# Patient Record
Sex: Male | Born: 1950
Health system: Southern US, Community
[De-identification: ages and names within clinical notes are randomized; demographics above are authoritative.]

## PROBLEM LIST (undated history)

## (undated) DIAGNOSIS — M19032 Primary osteoarthritis, left wrist: Secondary | ICD-10-CM

## (undated) DIAGNOSIS — Z951 Presence of aortocoronary bypass graft: Secondary | ICD-10-CM

## (undated) DIAGNOSIS — I2510 Atherosclerotic heart disease of native coronary artery without angina pectoris: Secondary | ICD-10-CM

## (undated) DIAGNOSIS — E785 Hyperlipidemia, unspecified: Secondary | ICD-10-CM

## (undated) DIAGNOSIS — Z95810 Presence of automatic (implantable) cardiac defibrillator: Secondary | ICD-10-CM

## (undated) DIAGNOSIS — I480 Paroxysmal atrial fibrillation: Secondary | ICD-10-CM

## (undated) DIAGNOSIS — I469 Cardiac arrest, cause unspecified: Secondary | ICD-10-CM

## (undated) DIAGNOSIS — N529 Male erectile dysfunction, unspecified: Secondary | ICD-10-CM

## (undated) DIAGNOSIS — I5022 Chronic systolic (congestive) heart failure: Secondary | ICD-10-CM

## (undated) DIAGNOSIS — N400 Enlarged prostate without lower urinary tract symptoms: Secondary | ICD-10-CM

## (undated) HISTORY — PX: MITRAL VALVE REPAIR: SHX2039

## (undated) HISTORY — PX: CARPAL TUNNEL RELEASE: SHX101

## (undated) HISTORY — DX: Primary osteoarthritis, left wrist: M19.032

## (undated) HISTORY — DX: Benign prostatic hyperplasia without lower urinary tract symptoms: N40.0

## (undated) HISTORY — DX: Presence of aortocoronary bypass graft: Z95.1

## (undated) HISTORY — DX: Cardiac arrest, cause unspecified: I46.9

## (undated) HISTORY — DX: Male erectile dysfunction, unspecified: N52.9

## (undated) HISTORY — PX: CORONARY ARTERY BYPASS GRAFT: SHX141

## (undated) HISTORY — DX: Atherosclerotic heart disease of native coronary artery without angina pectoris: I25.10

---

## 2004-10-12 ENCOUNTER — Emergency Department: Payer: Self-pay | Admitting: Emergency Medicine

## 2004-10-13 ENCOUNTER — Other Ambulatory Visit: Payer: Self-pay

## 2004-10-16 ENCOUNTER — Ambulatory Visit: Payer: Self-pay | Admitting: Family Medicine

## 2004-11-04 ENCOUNTER — Ambulatory Visit: Payer: Self-pay | Admitting: Internal Medicine

## 2005-01-08 ENCOUNTER — Ambulatory Visit: Payer: Self-pay | Admitting: Internal Medicine

## 2005-03-16 ENCOUNTER — Emergency Department: Payer: Self-pay | Admitting: Emergency Medicine

## 2006-05-14 DIAGNOSIS — I2510 Atherosclerotic heart disease of native coronary artery without angina pectoris: Secondary | ICD-10-CM

## 2006-05-14 HISTORY — DX: Atherosclerotic heart disease of native coronary artery without angina pectoris: I25.10

## 2007-02-13 ENCOUNTER — Emergency Department: Payer: Self-pay | Admitting: General Practice

## 2008-01-15 ENCOUNTER — Emergency Department: Payer: Self-pay | Admitting: Emergency Medicine

## 2008-01-15 ENCOUNTER — Other Ambulatory Visit: Payer: Self-pay

## 2008-06-24 ENCOUNTER — Other Ambulatory Visit: Payer: Self-pay

## 2008-06-25 ENCOUNTER — Ambulatory Visit: Payer: Self-pay | Admitting: Cardiology

## 2008-06-25 ENCOUNTER — Inpatient Hospital Stay: Payer: Self-pay | Admitting: Internal Medicine

## 2008-06-27 HISTORY — PX: CARDIAC CATHETERIZATION: SHX172

## 2009-09-21 ENCOUNTER — Ambulatory Visit: Payer: Self-pay | Admitting: Internal Medicine

## 2013-11-04 ENCOUNTER — Emergency Department: Payer: Self-pay | Admitting: Emergency Medicine

## 2013-11-05 LAB — COMPREHENSIVE METABOLIC PANEL
Albumin: 2.8 g/dL — ABNORMAL LOW (ref 3.4–5.0)
Alkaline Phosphatase: 120 U/L — ABNORMAL HIGH
Anion Gap: 13 (ref 7–16)
BUN: 16 mg/dL (ref 7–18)
Bilirubin,Total: 0.4 mg/dL (ref 0.2–1.0)
Calcium, Total: 7.2 mg/dL — ABNORMAL LOW (ref 8.5–10.1)
Chloride: 107 mmol/L (ref 98–107)
Creatinine: 1.74 mg/dL — ABNORMAL HIGH (ref 0.60–1.30)
EGFR (African American): 47 — ABNORMAL LOW
SGOT(AST): 63 U/L — ABNORMAL HIGH (ref 15–37)
SGPT (ALT): 53 U/L (ref 12–78)
Sodium: 140 mmol/L (ref 136–145)

## 2013-11-05 LAB — PROTIME-INR: Prothrombin Time: 15 secs — ABNORMAL HIGH (ref 11.5–14.7)

## 2013-11-05 LAB — APTT: Activated PTT: 31.9 secs (ref 23.6–35.9)

## 2013-11-05 LAB — CK TOTAL AND CKMB (NOT AT ARMC): CK, Total: 535 U/L — ABNORMAL HIGH (ref 35–232)

## 2013-11-12 DIAGNOSIS — I469 Cardiac arrest, cause unspecified: Secondary | ICD-10-CM

## 2013-11-12 HISTORY — DX: Cardiac arrest, cause unspecified: I46.9

## 2013-12-15 DIAGNOSIS — Z95810 Presence of automatic (implantable) cardiac defibrillator: Secondary | ICD-10-CM | POA: Insufficient documentation

## 2013-12-20 ENCOUNTER — Ambulatory Visit: Payer: Self-pay | Admitting: Family Medicine

## 2013-12-26 DIAGNOSIS — I480 Paroxysmal atrial fibrillation: Secondary | ICD-10-CM | POA: Insufficient documentation

## 2014-01-03 ENCOUNTER — Encounter: Payer: Self-pay | Admitting: Family Medicine

## 2014-01-18 ENCOUNTER — Ambulatory Visit: Payer: Self-pay | Admitting: Family Medicine

## 2014-01-29 ENCOUNTER — Encounter: Payer: Self-pay | Admitting: Family Medicine

## 2014-02-07 DIAGNOSIS — Z4502 Encounter for adjustment and management of automatic implantable cardiac defibrillator: Secondary | ICD-10-CM | POA: Insufficient documentation

## 2014-03-01 ENCOUNTER — Encounter: Payer: Self-pay | Admitting: Family Medicine

## 2014-05-12 LAB — CBC
Albumin Serum: 4.1
BUN/Creatinine Ratio: 11
CO2: 25
Calcium: 9.3 mg/dL
Chloride: 102 mmol/L
GFR, EST NON AFRICAN AMERICAN: 47
GFR, Est African American: 54
PHOSPHORUS: 3.8
VLDL Cholesterol Cal: 16

## 2014-05-12 LAB — LIPID PANEL
Cholesterol: 145 mg/dL (ref 0–200)
HDL: 46 mg/dL (ref 35–70)
LDL CALC: 83 mg/dL
LDL/HDL RATIO: 1.8
TRIGLYCERIDES: 81 mg/dL (ref 40–160)

## 2014-05-12 LAB — BASIC METABOLIC PANEL
BUN: 17 mg/dL (ref 4–21)
Creatinine: 1.6 mg/dL — AB (ref 0.6–1.3)
GLUCOSE: 86 mg/dL
POTASSIUM: 4 mmol/L (ref 3.4–5.3)
SODIUM: 143 mmol/L (ref 137–147)

## 2014-05-12 LAB — PSA: PSA: 0.7

## 2014-11-08 LAB — CBC
ALBUMIN SERUM: 4.1
BUN / CREAT RATIO: 9
BUN: 15
CO2: 26
Calcium: 9.4 mg/dL
Chloride: 100 mmol/L
Creat: 1.73
GFR, Est African American: 47
GFR, Est Non African American: 41
Glucose: 96
PHOSPHORUS: 3
POTASSIUM: 3.7 mmol/L
SODIUM: 143

## 2015-02-06 DIAGNOSIS — I2510 Atherosclerotic heart disease of native coronary artery without angina pectoris: Secondary | ICD-10-CM | POA: Diagnosis not present

## 2015-02-06 DIAGNOSIS — E119 Type 2 diabetes mellitus without complications: Secondary | ICD-10-CM | POA: Diagnosis not present

## 2015-02-06 DIAGNOSIS — I509 Heart failure, unspecified: Secondary | ICD-10-CM | POA: Diagnosis not present

## 2015-02-26 DIAGNOSIS — I255 Ischemic cardiomyopathy: Secondary | ICD-10-CM | POA: Diagnosis not present

## 2015-02-26 DIAGNOSIS — I5022 Chronic systolic (congestive) heart failure: Secondary | ICD-10-CM | POA: Diagnosis not present

## 2015-02-26 DIAGNOSIS — I480 Paroxysmal atrial fibrillation: Secondary | ICD-10-CM | POA: Diagnosis not present

## 2015-02-26 DIAGNOSIS — I10 Essential (primary) hypertension: Secondary | ICD-10-CM | POA: Diagnosis not present

## 2015-02-26 DIAGNOSIS — N183 Chronic kidney disease, stage 3 (moderate): Secondary | ICD-10-CM | POA: Diagnosis not present

## 2015-02-26 LAB — CBC
Albumin Serum: 4.5
BUN / CREAT RATIO: 9
BUN: 16
CHLORIDE: 100 mmol/L
Calcium: 9.6 mg/dL
Carbon Dioxide, Total: 26
Creat: 1.73
GFR, EST NON AFRICAN AMERICAN: 41
GFR, Est African American: 47
GLUCOSE: 88
PHOSPHORUS: 2.6
Potassium: 4.4 mmol/L
Sodium: 142

## 2015-02-27 DIAGNOSIS — E784 Other hyperlipidemia: Secondary | ICD-10-CM | POA: Diagnosis not present

## 2015-02-27 DIAGNOSIS — H5210 Myopia, unspecified eye: Secondary | ICD-10-CM | POA: Diagnosis not present

## 2015-02-27 DIAGNOSIS — I10 Essential (primary) hypertension: Secondary | ICD-10-CM | POA: Diagnosis not present

## 2015-03-01 DIAGNOSIS — I4891 Unspecified atrial fibrillation: Secondary | ICD-10-CM | POA: Diagnosis not present

## 2015-03-02 DIAGNOSIS — Z0100 Encounter for examination of eyes and vision without abnormal findings: Secondary | ICD-10-CM | POA: Diagnosis not present

## 2015-03-29 ENCOUNTER — Encounter: Payer: Self-pay | Admitting: *Deleted

## 2015-03-29 DIAGNOSIS — R29898 Other symptoms and signs involving the musculoskeletal system: Secondary | ICD-10-CM | POA: Insufficient documentation

## 2015-03-29 DIAGNOSIS — I429 Cardiomyopathy, unspecified: Secondary | ICD-10-CM | POA: Insufficient documentation

## 2015-03-29 DIAGNOSIS — I43 Cardiomyopathy in diseases classified elsewhere: Secondary | ICD-10-CM | POA: Insufficient documentation

## 2015-03-29 DIAGNOSIS — Z95810 Presence of automatic (implantable) cardiac defibrillator: Secondary | ICD-10-CM

## 2015-03-29 DIAGNOSIS — I10 Essential (primary) hypertension: Secondary | ICD-10-CM | POA: Insufficient documentation

## 2015-03-29 DIAGNOSIS — N183 Chronic kidney disease, stage 3 unspecified: Secondary | ICD-10-CM

## 2015-03-29 DIAGNOSIS — E785 Hyperlipidemia, unspecified: Secondary | ICD-10-CM

## 2015-03-29 DIAGNOSIS — N400 Enlarged prostate without lower urinary tract symptoms: Secondary | ICD-10-CM

## 2015-03-29 DIAGNOSIS — I159 Secondary hypertension, unspecified: Secondary | ICD-10-CM

## 2015-03-29 DIAGNOSIS — M79672 Pain in left foot: Secondary | ICD-10-CM | POA: Insufficient documentation

## 2015-03-29 DIAGNOSIS — N529 Male erectile dysfunction, unspecified: Secondary | ICD-10-CM | POA: Insufficient documentation

## 2015-03-29 DIAGNOSIS — I119 Hypertensive heart disease without heart failure: Secondary | ICD-10-CM | POA: Insufficient documentation

## 2015-05-17 ENCOUNTER — Ambulatory Visit (INDEPENDENT_AMBULATORY_CARE_PROVIDER_SITE_OTHER): Payer: Commercial Managed Care - HMO | Admitting: Family Medicine

## 2015-05-17 ENCOUNTER — Encounter: Payer: Self-pay | Admitting: Family Medicine

## 2015-05-17 VITALS — BP 140/78 | HR 57 | Temp 98.1°F | Resp 16 | Ht 68.5 in | Wt 200.0 lb

## 2015-05-17 DIAGNOSIS — I482 Chronic atrial fibrillation, unspecified: Secondary | ICD-10-CM

## 2015-05-17 DIAGNOSIS — Z1211 Encounter for screening for malignant neoplasm of colon: Secondary | ICD-10-CM

## 2015-05-17 DIAGNOSIS — N183 Chronic kidney disease, stage 3 unspecified: Secondary | ICD-10-CM

## 2015-05-17 DIAGNOSIS — I110 Hypertensive heart disease with heart failure: Secondary | ICD-10-CM

## 2015-05-17 DIAGNOSIS — I159 Secondary hypertension, unspecified: Secondary | ICD-10-CM

## 2015-05-17 DIAGNOSIS — Z0000 Encounter for general adult medical examination without abnormal findings: Secondary | ICD-10-CM | POA: Diagnosis not present

## 2015-05-17 DIAGNOSIS — Z951 Presence of aortocoronary bypass graft: Secondary | ICD-10-CM

## 2015-05-17 DIAGNOSIS — E785 Hyperlipidemia, unspecified: Secondary | ICD-10-CM | POA: Diagnosis not present

## 2015-05-17 DIAGNOSIS — Z125 Encounter for screening for malignant neoplasm of prostate: Secondary | ICD-10-CM

## 2015-05-17 DIAGNOSIS — I429 Cardiomyopathy, unspecified: Secondary | ICD-10-CM | POA: Diagnosis not present

## 2015-05-17 DIAGNOSIS — Z95810 Presence of automatic (implantable) cardiac defibrillator: Secondary | ICD-10-CM | POA: Diagnosis not present

## 2015-05-17 DIAGNOSIS — I5020 Unspecified systolic (congestive) heart failure: Secondary | ICD-10-CM | POA: Insufficient documentation

## 2015-05-17 DIAGNOSIS — I43 Cardiomyopathy in diseases classified elsewhere: Secondary | ICD-10-CM

## 2015-05-17 DIAGNOSIS — K219 Gastro-esophageal reflux disease without esophagitis: Secondary | ICD-10-CM | POA: Insufficient documentation

## 2015-05-17 HISTORY — DX: Presence of aortocoronary bypass graft: Z95.1

## 2015-05-17 NOTE — Progress Notes (Signed)
Patient ID: Chris Nguyen, male   DOB: 1950-12-21, 64 y.o.   MRN: [id number] Patient: Chris Nguyen, Male    DOB: October 28, 1951, 64 y.o.   MRN: [id number] Visit Date: 05/17/2015  Today's Provider: Lelon Huh, MD   Chief Complaint  Patient presents with  . Medicare Wellness  . Hypertension  . Hyperlipidemia   Subjective:    Annual wellness visit Chris Nguyen is a 64 y.o. male who presents today for his Subsequent Annual Wellness Visit. He feels well. He reports exercising no but walking " is a choice with a bad leg". He reports he is sleeping fairly well.  -----------------------------------------------------------   Review of Systems  Constitutional: Negative for fever, chills, diaphoresis, appetite change and fatigue.  HENT: Negative.  Negative for congestion.   Eyes: Negative.   Respiratory: Negative.  Negative for cough, chest tightness, shortness of breath and wheezing.   Cardiovascular: Negative.  Negative for chest pain and leg swelling.  Gastrointestinal: Positive for abdominal distention. Negative for abdominal pain, diarrhea and constipation.  Endocrine: Negative.  Negative for polydipsia, polyphagia and polyuria.  Genitourinary: Positive for frequency (at night).  Musculoskeletal: Negative.   Skin: Negative.   Allergic/Immunologic: Positive for environmental allergies.  Neurological: Positive for weakness. Negative for dizziness and speech difficulty.       Weakness on left leg since blood clot in 10/2013.  Hematological: Negative.  Negative for adenopathy.  Psychiatric/Behavioral: Negative.  Negative for behavioral problems, confusion and dysphoric mood.    History   Social History  . Marital Status: Married    Spouse Name: N/A  . Number of Children: 5  . Years of Education: N/A   Occupational History  . Retired    Social History Main Topics  . Smoking status: Former Smoker    Quit date: 12/01/2004  . Smokeless tobacco: Not on file  . Alcohol Use: 1.2 -  1.8 oz/week    1-2 Cans of beer, 1 Glasses of wine per week  . Drug Use: No  . Sexual Activity: Not on file   Other Topics Concern  . Not on file   Social History Narrative    Patient Active Problem List   Diagnosis Date Noted  . Chronic kidney disease (CKD), stage III (moderate) 05/17/2015  . Acid reflux 05/17/2015  . Heart failure, systolic 46/56/8127  . Hyperlipidemia   . Hypertension   . Hypertensive cardiomyopathy   . Cardiomyopathy   . Foot pain, left   . BPH (benign prostatic hyperplasia)   . Erectile dysfunction   . Left leg weakness   . Encounter for adjustment or management of automatic implantable cardioverter-defibrillator 02/07/2014  . A-fib 12/26/2013  . Automatic implantable cardioverter-defibrillator in situ 12/15/2013  . Osteoarthritis of left wrist 09/27/2010  . CAD (coronary artery disease) 05/14/2006    Past Surgical History  Procedure Laterality Date  . Cardiac catheterization  06/27/2008  . Coronary artery bypass graft  2006 and 2014    LIMA to LAD at [facility] 2014  . Mitral valve repair    . Carpal tunnel release      His family history includes CAD (age of onset: 76) in his mother; Dementia in his father; Diabetes in his mother; Glaucoma in his father.    Previous Medications   AMIODARONE (PACERONE) 200 MG TABLET    Take 200 mg by mouth daily.   ASPIRIN 81 MG CHEWABLE TABLET    Chew by mouth.   ATORVASTATIN (LIPITOR) 80 MG TABLET    Take  80 mg by mouth daily.   FOLIC ACID (FOLVITE) 1 MG TABLET    Take 1 mg by mouth daily.   GABAPENTIN (NEURONTIN) 400 MG CAPSULE    Take 400 mg by mouth 3 (three) times daily.   LISINOPRIL (PRINIVIL,ZESTRIL) 10 MG TABLET    Take 10 mg by mouth daily.   METOPROLOL SUCCINATE (TOPROL-XL) 25 MG 24 HR TABLET    Take 25 mg by mouth daily.   SALINE NASAL SPRAY NA    Place 1 spray into the nose every 8 (eight) hours as needed (for dryness).   SILDENAFIL (REVATIO) 20 MG TABLET    Take 5 tablets by mouth 30 minutes prior to  intercourse.   TADALAFIL (CIALIS) 5 MG TABLET    Take 5 mg by mouth daily as needed for erectile dysfunction.   TRAMADOL HCL 50 MG TBDP    Take 50 mg by mouth 2 (two) times daily as needed.    Patient Care Team: Birdie Sons, MD as PCP - General (Family Medicine)     Objective:   Vitals: BP 140/78 mmHg  Pulse 57  Temp(Src) 98.1 F (36.7 C) (Oral)  Resp 16  Ht 5' 8.5" (1.74 m)  Wt 200 lb (90.719 kg)  BMI 29.96 kg/m2  SpO2 94%  Physical Exam   General Appearance:    Alert, cooperative, no distress, appears stated age  Head:    Normocephalic, without obvious abnormality, atraumatic  Eyes:    PERRL, conjunctiva/corneas clear, EOM's intact, fundi    benign, both eyes       Ears:    Normal TM's and external ear canals, both ears  Nose:   Nares normal, septum midline, mucosa normal, no drainage   or sinus tenderness  Throat:   Lips, mucosa, and tongue normal; teeth and gums normal  Neck:   Supple, symmetrical, trachea midline, no adenopathy;       thyroid:  No enlargement/tenderness/nodules; no carotid   bruit or JVD  Back:     Symmetric, no curvature, ROM normal, no CVA tenderness  Lungs:     Clear to auscultation bilaterally, respirations unlabored  Chest wall:    No tenderness or deformity  Heart:    Irregular rate and rhythm, S1 and S2 normal, no murmur, rub   or gallop  Abdomen:     Soft, non-tender, bowel sounds active all four quadrants,    no masses, no organomegaly  Genitalia:    deferred  Rectal:    deferred  Extremities:   Extremities normal, atraumatic, no cyanosis or edema  Pulses:   2+ and symmetric all extremities  Skin:   Skin color, texture, turgor normal, no rashes or lesions. Scattered benign appearing seborrheic keratoses across back.   Lymph nodes:   Cervical, supraclavicular, and axillary nodes normal  Neurologic:   CNII-XII intact. Normal strength, sensation and reflexes      throughout    Activities of Daily Living No flowsheet data  found.  Fall Risk Assessment Fall Risk  05/17/2015  Falls in the past year? No  Risk for fall due to : Impaired mobility     Depression Screen PHQ 2/9 Scores 05/17/2015  PHQ - 2 Score 0    Cognitive Testing - 6-CIT  Correct? Score   What year is it? yes 0 0 or 4  What month is it? yes 0 0 or 3  Memorize:    [place], [place],  [address]  [place],  What time is it? (within 1 hour) yes 0 0 or 3  Count backwards from 20 yes 0 0, 2, or 4  Name the months of the year yes 0 0, 2, or 4  Repeat name & address above yes 0 0, 2, 4, 6, 8, or 10       TOTAL SCORE  0/28   Interpretation:  Normal  Normal (0-7) Abnormal (8-28)       Assessment & Plan:     Annual Wellness Visit  Reviewed patient's Family Medical History Reviewed and updated list of patient's medical providers Assessment of cognitive impairment was done Assessed patient's functional ability Established a written schedule for health screening [place] Completed and Reviewed  Exercise Activities and Dietary recommendations Goals    None       There is no immunization history on file for this patient.  Health Maintenance  Topic Date Due  . HIV Screening  09/16/1965  . TETANUS/TDAP  09/16/1969  . COLONOSCOPY  09/16/2000  . ZOSTAVAX  09/16/2010  . INFLUENZA VACCINE  07/02/2015      Discussed health benefits of physical activity, and encouraged him to engage in regular exercise appropriate for his age and condition.    Refused pneumovax and shingles vaccine ------------------------------------------------------------------------------------------------------------  1. Hyperlipidemia He is tolerating atorvastatin well with no adverse effects.   - Lipid panel - T4 AND TSH  2. Secondary hypertension, unspecified Fairly well controlled.   3. Cardiomyopathy On chronic amiodarone. He was advised he needs regular eye exams and he state he has already had referral to [facility], but has not gone yet.  - Spirometry with graph  4. Hypertensive cardiomyopathy, with heart failure Stable, currently asymptomatic. Continue current medications.  Follow up Dr. Clayborn Bigness in September as scheduled.   5. Cardiac defibrillator in place On Amiodarone - Hepatic function panel  6. Chronic atrial fibrillation  - Hepatic function panel  7. Chronic kidney disease (CKD), stage III (moderate)  - Renal function panel - Vit D  25 hydroxy (rtn osteoporosis monitoring)  8. Annual physical exam   9. Prostate cancer screening  - PSA  10. Colon cancer screening  - Cologuard; Future

## 2015-05-18 DIAGNOSIS — I482 Chronic atrial fibrillation: Secondary | ICD-10-CM | POA: Diagnosis not present

## 2015-05-18 DIAGNOSIS — Z125 Encounter for screening for malignant neoplasm of prostate: Secondary | ICD-10-CM | POA: Diagnosis not present

## 2015-05-18 DIAGNOSIS — N183 Chronic kidney disease, stage 3 (moderate): Secondary | ICD-10-CM | POA: Diagnosis not present

## 2015-05-18 DIAGNOSIS — E785 Hyperlipidemia, unspecified: Secondary | ICD-10-CM | POA: Diagnosis not present

## 2015-05-18 DIAGNOSIS — Z95810 Presence of automatic (implantable) cardiac defibrillator: Secondary | ICD-10-CM | POA: Diagnosis not present

## 2015-05-19 LAB — HEPATIC FUNCTION PANEL
ALK PHOS: 127 IU/L — AB (ref 39–117)
ALT: 38 IU/L (ref 0–44)
AST: 30 IU/L (ref 0–40)
Bilirubin Total: 0.8 mg/dL (ref 0.0–1.2)
Bilirubin, Direct: 0.2 mg/dL (ref 0.00–0.40)
Total Protein: 6.9 g/dL (ref 6.0–8.5)

## 2015-05-19 LAB — RENAL FUNCTION PANEL
ALBUMIN: 4.3 g/dL (ref 3.6–4.8)
BUN/Creatinine Ratio: 9 — ABNORMAL LOW (ref 10–22)
BUN: 16 mg/dL (ref 8–27)
CALCIUM: 9.5 mg/dL (ref 8.6–10.2)
CHLORIDE: 99 mmol/L (ref 97–108)
CO2: 28 mmol/L (ref 18–29)
Creatinine, Ser: 1.75 mg/dL — ABNORMAL HIGH (ref 0.76–1.27)
GFR calc Af Amer: 47 mL/min/{1.73_m2} — ABNORMAL LOW (ref >59)
GFR calc non Af Amer: 40 mL/min/{1.73_m2} — ABNORMAL LOW (ref >59)
GLUCOSE: 87 mg/dL (ref 65–99)
Phosphorus: 3.3 mg/dL (ref 2.5–4.5)
Potassium: 4.2 mmol/L (ref 3.5–5.2)
SODIUM: 143 mmol/L (ref 134–144)

## 2015-05-19 LAB — VITAMIN D 25 HYDROXY (VIT D DEFICIENCY, FRACTURES): VIT D 25 HYDROXY: 11.4 ng/mL — AB (ref 30.0–100.0)

## 2015-05-19 LAB — LIPID PANEL
Chol/HDL Ratio: 3.3 ratio units (ref 0.0–5.0)
Cholesterol, Total: 183 mg/dL (ref 100–199)
HDL: 56 mg/dL (ref >39)
LDL CALC: 110 mg/dL — AB (ref 0–99)
Triglycerides: 87 mg/dL (ref 0–149)
VLDL Cholesterol Cal: 17 mg/dL (ref 5–40)

## 2015-05-19 LAB — T4 AND TSH
T4 TOTAL: 7.4 ug/dL (ref 4.5–12.0)
TSH: 9.63 u[IU]/mL — ABNORMAL HIGH (ref 0.450–4.500)

## 2015-05-19 LAB — PSA: Prostate Specific Ag, Serum: 0.6 ng/mL (ref 0.0–4.0)

## 2015-05-22 ENCOUNTER — Telehealth: Payer: Self-pay

## 2015-05-22 NOTE — Telephone Encounter (Signed)
Advised pt of lab results. Pt verbally acknowledges understanding. Emily Drozdowski, CMA   

## 2015-05-22 NOTE — Telephone Encounter (Signed)
-----   Message from Birdie Sons, MD sent at 05/19/2015  8:41 AM EDT ----- PSA is normal. Cholesterol is OK. His thyroid and vitamin D levels are both low. Need to start OTC vitamin D 2,000 units daily. Will need to follow up in 3 months tor recheck vitamin d levels, and to recheck thyroid functions. If thyroid functions are still low at follow up he may need thyroid medications too.

## 2015-05-25 ENCOUNTER — Encounter: Payer: Self-pay | Admitting: Family Medicine

## 2015-05-25 LAB — PULMONARY FUNCTION TEST

## 2015-06-12 DIAGNOSIS — Z1211 Encounter for screening for malignant neoplasm of colon: Secondary | ICD-10-CM | POA: Diagnosis not present

## 2015-06-12 DIAGNOSIS — Z1212 Encounter for screening for malignant neoplasm of rectum: Secondary | ICD-10-CM | POA: Diagnosis not present

## 2015-06-12 LAB — COLOGUARD: Cologuard: POSITIVE

## 2015-06-16 IMAGING — CR DG CHEST 1V PORT
1 series · 1 of 1 positions shown · non-contrast
Comparison: Chest radiograph performed 09/21/2009

CLINICAL DATA: Cardiac arrest; status post endotracheal tube
placement.

EXAM:
PORTABLE CHEST - 1 VIEW

[ap]
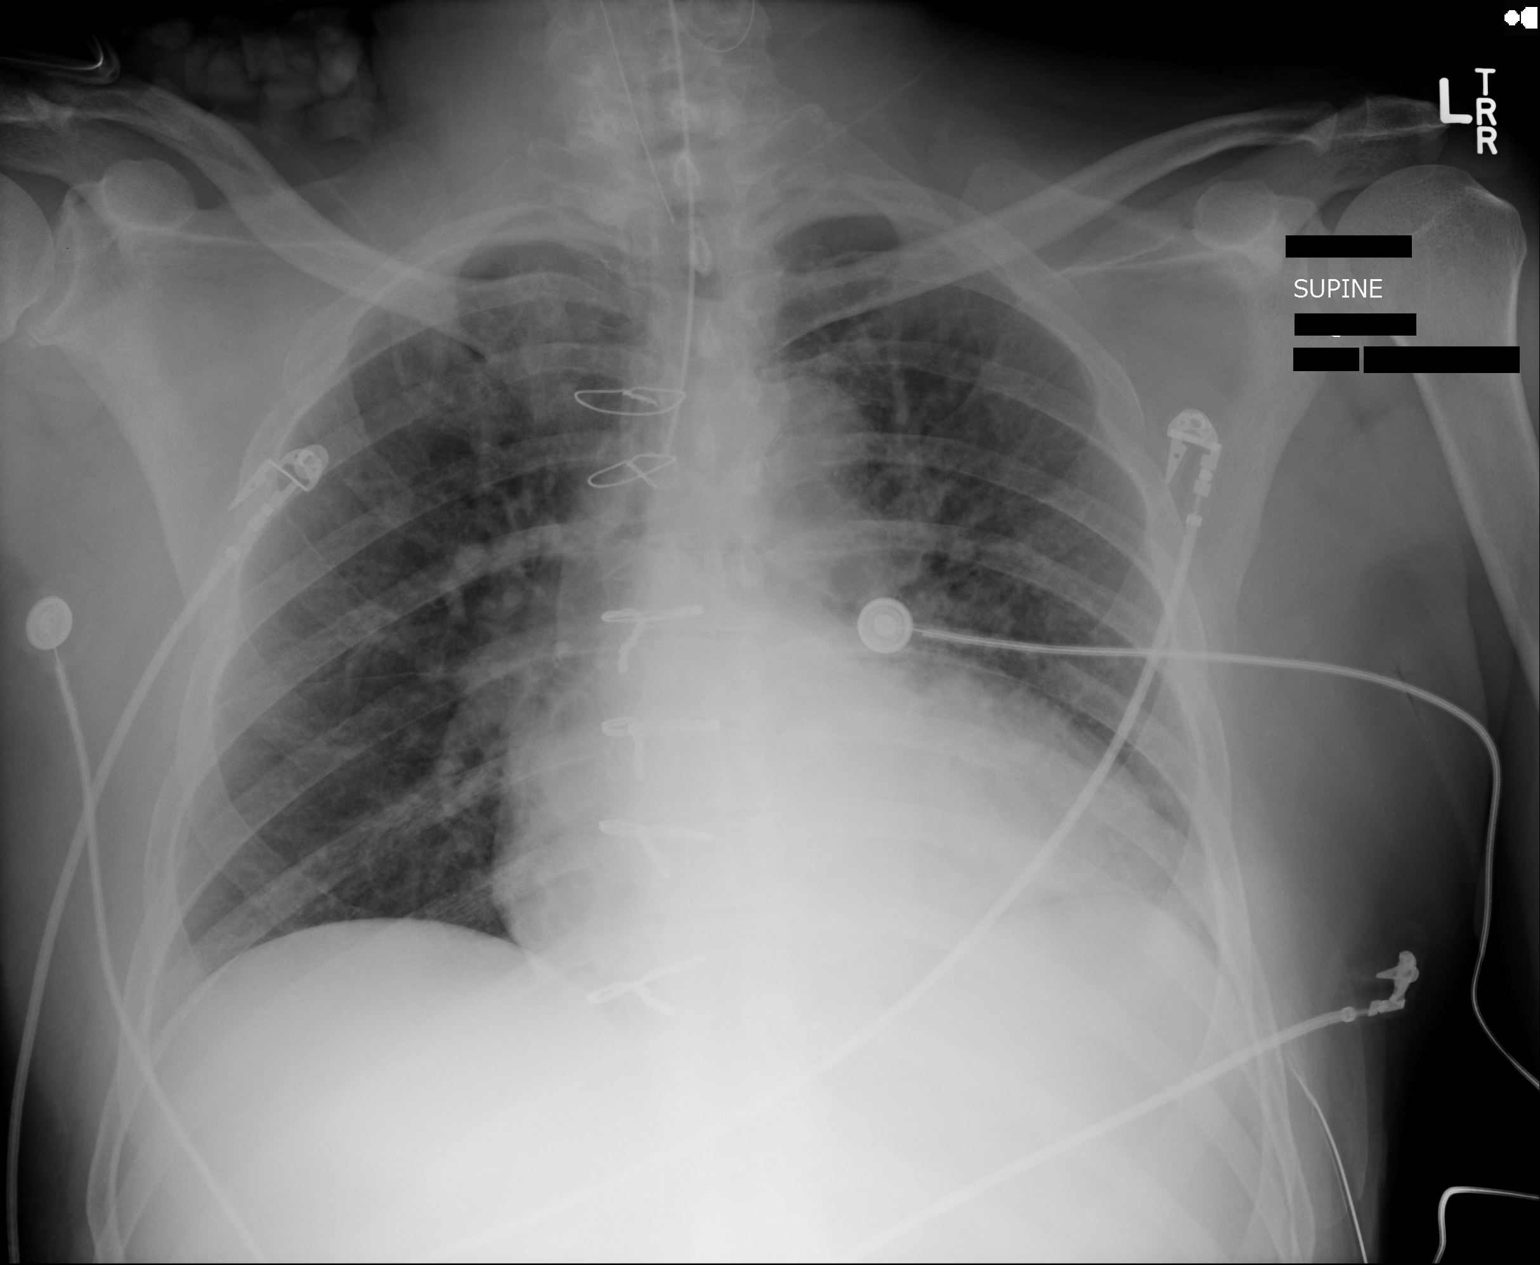

[1 of 1 positions shown; findings below may reference images not displayed]

FINDINGS: The patient's endotracheal tube is seen ending 2 cm above the
carina. The enteric tube is noted ending at the proximal esophagus;
this appears to be coiled within the patient's hypopharynx.

Vascular congestion is noted; the lungs are mildly hypoexpanded. No
definite pleural effusion or pneumothorax is seen.

The cardiomediastinal silhouette is mildly enlarged. The patient is
status post median sternotomy. No acute osseous abnormalities are
identified.
IMPRESSION: 1. Endotracheal tube seen ending 2 cm above the carina.
2. Enteric tube noted ending in the proximal esophagus; this appears
to be coiled within the patient's hypopharynx. Would suggest
retracting and repositioning the enteric tube.
3. Vascular congestion and mild cardiomegaly; lungs mildly
hypoexpanded but grossly clear.

## 2015-06-19 ENCOUNTER — Telehealth: Payer: Self-pay | Admitting: Family Medicine

## 2015-06-19 NOTE — Telephone Encounter (Signed)
Chris Nguyen called needing to give a cologuard text result.  Could you please return his call.  [phone].  Reference order # [id number].  Thanks, [employer]

## 2015-06-19 NOTE — Telephone Encounter (Signed)
Returned call. Results faxed to office from [employer].

## 2015-06-25 ENCOUNTER — Other Ambulatory Visit: Payer: Self-pay | Admitting: Family Medicine

## 2015-06-25 DIAGNOSIS — R195 Other fecal abnormalities: Secondary | ICD-10-CM

## 2015-06-25 DIAGNOSIS — Z1211 Encounter for screening for malignant neoplasm of colon: Secondary | ICD-10-CM

## 2015-06-25 NOTE — Progress Notes (Unsigned)
Please advise patient that Cologuard test was positive which is indication of colon polyps. Needs referral to GI for further evaluation.

## 2015-06-26 NOTE — Progress Notes (Unsigned)
Patient notified of results.

## 2015-07-28 ENCOUNTER — Other Ambulatory Visit: Payer: Self-pay | Admitting: Family Medicine

## 2015-08-30 DIAGNOSIS — R0602 Shortness of breath: Secondary | ICD-10-CM | POA: Diagnosis not present

## 2015-08-30 DIAGNOSIS — N183 Chronic kidney disease, stage 3 (moderate): Secondary | ICD-10-CM | POA: Diagnosis not present

## 2015-08-30 DIAGNOSIS — I639 Cerebral infarction, unspecified: Secondary | ICD-10-CM | POA: Diagnosis not present

## 2015-08-30 DIAGNOSIS — I4891 Unspecified atrial fibrillation: Secondary | ICD-10-CM | POA: Diagnosis not present

## 2015-08-30 DIAGNOSIS — G629 Polyneuropathy, unspecified: Secondary | ICD-10-CM | POA: Diagnosis not present

## 2015-08-30 DIAGNOSIS — I429 Cardiomyopathy, unspecified: Secondary | ICD-10-CM | POA: Diagnosis not present

## 2015-08-30 DIAGNOSIS — I509 Heart failure, unspecified: Secondary | ICD-10-CM | POA: Diagnosis not present

## 2015-08-30 DIAGNOSIS — I10 Essential (primary) hypertension: Secondary | ICD-10-CM | POA: Diagnosis not present

## 2015-09-18 DIAGNOSIS — I509 Heart failure, unspecified: Secondary | ICD-10-CM | POA: Diagnosis not present

## 2015-09-18 DIAGNOSIS — I429 Cardiomyopathy, unspecified: Secondary | ICD-10-CM | POA: Diagnosis not present

## 2015-09-18 DIAGNOSIS — R0602 Shortness of breath: Secondary | ICD-10-CM | POA: Diagnosis not present

## 2015-09-27 DIAGNOSIS — I4891 Unspecified atrial fibrillation: Secondary | ICD-10-CM | POA: Diagnosis not present

## 2015-09-27 DIAGNOSIS — I429 Cardiomyopathy, unspecified: Secondary | ICD-10-CM | POA: Diagnosis not present

## 2015-09-27 DIAGNOSIS — I10 Essential (primary) hypertension: Secondary | ICD-10-CM | POA: Diagnosis not present

## 2015-09-27 DIAGNOSIS — I509 Heart failure, unspecified: Secondary | ICD-10-CM | POA: Diagnosis not present

## 2015-09-27 DIAGNOSIS — Z95810 Presence of automatic (implantable) cardiac defibrillator: Secondary | ICD-10-CM | POA: Diagnosis not present

## 2015-09-27 DIAGNOSIS — N183 Chronic kidney disease, stage 3 (moderate): Secondary | ICD-10-CM | POA: Diagnosis not present

## 2015-09-27 DIAGNOSIS — G629 Polyneuropathy, unspecified: Secondary | ICD-10-CM | POA: Diagnosis not present

## 2015-09-27 DIAGNOSIS — R0602 Shortness of breath: Secondary | ICD-10-CM | POA: Diagnosis not present

## 2015-11-23 ENCOUNTER — Encounter: Payer: Self-pay | Admitting: Family Medicine

## 2015-11-23 ENCOUNTER — Ambulatory Visit (INDEPENDENT_AMBULATORY_CARE_PROVIDER_SITE_OTHER): Payer: Commercial Managed Care - HMO | Admitting: Family Medicine

## 2015-11-23 VITALS — BP 124/82 | HR 74 | Temp 97.6°F | Resp 16 | Wt 197.0 lb

## 2015-11-23 DIAGNOSIS — J069 Acute upper respiratory infection, unspecified: Secondary | ICD-10-CM | POA: Diagnosis not present

## 2015-11-23 MED ORDER — AZITHROMYCIN 250 MG PO TABS: ORAL_TABLET | ORAL | Status: AC

## 2015-11-23 NOTE — Progress Notes (Signed)
Patient: Chris Nguyen Male    DOB: [birth year]   64 y.o.   MRN: [id number] Visit Date: 11/23/2015  Today's Provider: Lelon Huh, MD   Chief Complaint  Patient presents with  . URI   Subjective:    URI  This is a new problem. Episode onset: 8 days ago. The problem has been gradually improving. There has been no fever. Associated symptoms include congestion, coughing (productive with clear phlegm), headaches, rhinorrhea, sinus pain and sneezing. Pertinent negatives include no abdominal pain, chest pain, diarrhea, dysuria, ear pain, joint pain, joint swelling, nausea, neck pain, plugged ear sensation, rash, sore throat, swollen glands, vomiting or wheezing. Treatments tried: Mucinex DM and Mucinex D. The treatment provided moderate relief.  Patient has also been applying a thin layer of Vaseline in his nasal passages which helped stop the nose bleeds.  He feels much better today.      Allergies  Allergen Reactions  . Spironolactone Other (See Comments)    Hyperkalemia  . Ziac [Bisoprolol-Hydrochlorothiazide]    Previous Medications   AMIODARONE (PACERONE) 200 MG TABLET    TAKE 1 TABLET DAILY FOR ATRIAL FIBRILLATION   ASPIRIN 81 MG CHEWABLE TABLET    Chew by mouth.   ATORVASTATIN (LIPITOR) 80 MG TABLET    Take 80 mg by mouth daily.   FOLIC ACID (FOLVITE) 1 MG TABLET    TAKE ONE TABLET BY MOUTH ONCE DAILY   LISINOPRIL (PRINIVIL,ZESTRIL) 20 MG TABLET    Take 1 tablet by mouth daily.   METOPROLOL SUCCINATE (TOPROL-XL) 25 MG 24 HR TABLET    Take 25 mg by mouth daily.   SALINE NASAL SPRAY NA    Place 1 spray into the nose every 8 (eight) hours as needed (for dryness).   SILDENAFIL (REVATIO) 20 MG TABLET    Take 5 tablets by mouth 30 minutes prior to intercourse.   TADALAFIL (CIALIS) 5 MG TABLET    Take 5 mg by mouth daily as needed for erectile dysfunction.   TRAMADOL HCL 50 MG TBDP    Take 50 mg by mouth 2 (two) times daily as needed.    Review of Systems  Constitutional:  Negative for fever, chills, diaphoresis, appetite change and fatigue.  HENT: Positive for congestion, nosebleeds, postnasal drip, rhinorrhea, sinus pressure, sneezing and voice change. Negative for ear pain and sore throat.   Eyes: Positive for discharge (watery eyes). Negative for pain, redness and itching.  Respiratory: Positive for cough (productive with clear phlegm). Negative for chest tightness, shortness of breath and wheezing.   Cardiovascular: Negative for chest pain and palpitations.  Gastrointestinal: Negative for nausea, vomiting, abdominal pain and diarrhea.  Genitourinary: Negative for dysuria.  Musculoskeletal: Negative for joint pain and neck pain.  Skin: Negative for rash.  Neurological: Positive for headaches.    Social History  Substance Use Topics  . Smoking status: Former Smoker    Quit date: 12/01/2004  . Smokeless tobacco: Not on file  . Alcohol Use: 1.2 - 1.8 oz/week    1-2 Cans of beer, 1 Glasses of wine per week   Objective:   BP 124/82 mmHg  Pulse 74  Temp(Src) 97.6 F (36.4 C) (Oral)  Resp 16  Wt 197 lb (89.359 kg)  SpO2 98%  Physical Exam  General Appearance:    Alert, cooperative, no distress  HENT:   bilateral TM normal without fluid or infection, sinuses nontender and nasal mucosa pale and congested  Eyes:    PERRL,  conjunctiva/corneas clear, EOM's intact       Lungs:     Clear to auscultation bilaterally, respirations unlabored  Heart:    Regular rate and rhythm  Neurologic:   Awake, alert, oriented x 3. No apparent focal neurological           defect.           Assessment & Plan:     1. Upper respiratory infection Counseled regarding signs and symptoms of viral and bacterial respiratory infections. Advised to fill abx prescription if he develops any sign of bacterial infection, or if current symptoms last longer than 10 days.   - azithromycin (ZITHROMAX) 250 MG tablet; 2 by mouth today, then 1 daily for 4 days  Dispense: 6 tablet; Refill:  0       Lelon Huh, MD  [facility]

## 2015-12-11 ENCOUNTER — Other Ambulatory Visit: Payer: Self-pay | Admitting: Family Medicine

## 2015-12-27 DIAGNOSIS — I4891 Unspecified atrial fibrillation: Secondary | ICD-10-CM | POA: Diagnosis not present

## 2016-02-05 ENCOUNTER — Other Ambulatory Visit: Payer: Self-pay | Admitting: Family Medicine

## 2016-02-08 ENCOUNTER — Ambulatory Visit (INDEPENDENT_AMBULATORY_CARE_PROVIDER_SITE_OTHER): Payer: Commercial Managed Care - HMO | Admitting: Family Medicine

## 2016-02-08 ENCOUNTER — Encounter: Payer: Self-pay | Admitting: Family Medicine

## 2016-02-08 VITALS — BP 150/94 | HR 58 | Temp 100.3°F | Resp 24 | Ht 68.5 in | Wt 195.0 lb

## 2016-02-08 DIAGNOSIS — J40 Bronchitis, not specified as acute or chronic: Secondary | ICD-10-CM | POA: Diagnosis not present

## 2016-02-08 LAB — POCT INFLUENZA A/B
Influenza A, POC: NEGATIVE
Influenza B, POC: NEGATIVE

## 2016-02-08 MED ORDER — ALBUTEROL SULFATE HFA 108 (90 BASE) MCG/ACT IN AERS: 2.0000 | INHALATION_SPRAY | Freq: Four times a day (QID) | RESPIRATORY_TRACT | Status: DC | PRN

## 2016-02-08 MED ORDER — CEFDINIR 300 MG PO CAPS: 300.0000 mg | ORAL_CAPSULE | Freq: Two times a day (BID) | ORAL | Status: AC

## 2016-02-08 NOTE — Progress Notes (Signed)
Patient: Chris Nguyen Male    DOB: 12-06-1950   65 y.o.   MRN: [id number] Visit Date: 02/08/2016  Today's Provider: Lelon Huh, MD   Chief Complaint  Patient presents with  . Cough  . Fever   Subjective:    Cough This is a new problem. The current episode started in the past 7 days (2 days). The problem has been gradually worsening. The problem occurs hourly. The cough is non-productive. Associated symptoms include chest pain, chills, a fever, headaches, myalgias, postnasal drip, rhinorrhea, shortness of breath, sweats and wheezing. Pertinent negatives include no ear congestion, ear pain, heartburn, hemoptysis, nasal congestion, rash or sore throat. Nothing aggravates the symptoms. The treatment provided no relief. His past medical history is significant for environmental allergies. There is no history of asthma, bronchiectasis, bronchitis, COPD, emphysema or pneumonia.    Cough and chest congestion started Wednesday 02/06/2016. Fever, chills, sob, wheezing, and some muscles aches. Chest pain when coughing. Cough is not really productive.    Allergies  Allergen Reactions  . Spironolactone Other (See Comments)    Hyperkalemia  . Ziac [Bisoprolol-Hydrochlorothiazide]    Previous Medications   AMIODARONE (PACERONE) 200 MG TABLET    TAKE 1 TABLET DAILY FOR ATRIAL FIBRILLATION   ASPIRIN 81 MG CHEWABLE TABLET    Chew by mouth.   ATORVASTATIN (LIPITOR) 80 MG TABLET    TAKE 1 TABLET EVERY DAY   FOLIC ACID (FOLVITE) 1 MG TABLET    TAKE ONE TABLET BY MOUTH ONCE DAILY   LISINOPRIL (PRINIVIL,ZESTRIL) 20 MG TABLET    Take 1 tablet by mouth daily.   METOPROLOL SUCCINATE (TOPROL-XL) 25 MG 24 HR TABLET    TAKE 1 TABLET EVERY DAY   SALINE NASAL SPRAY NA    Place 1 spray into the nose every 8 (eight) hours as needed (for dryness).   SILDENAFIL (REVATIO) 20 MG TABLET    Take 5 tablets by mouth 30 minutes prior to intercourse.   TADALAFIL (CIALIS) 5 MG TABLET    Take 5 mg by mouth daily as  needed for erectile dysfunction.   TRAMADOL HCL 50 MG TBDP    Take 50 mg by mouth 2 (two) times daily as needed.    Review of Systems  Constitutional: Positive for fever and chills.  HENT: Positive for postnasal drip and rhinorrhea. Negative for ear pain and sore throat.   Respiratory: Positive for cough, chest tightness, shortness of breath and wheezing. Negative for hemoptysis.   Cardiovascular: Positive for chest pain.  Gastrointestinal: Negative for heartburn.  Musculoskeletal: Positive for myalgias.  Skin: Negative for rash.  Allergic/Immunologic: Positive for environmental allergies.  Neurological: Positive for headaches.    Social History  Substance Use Topics  . Smoking status: Former Smoker    Quit date: 12/01/2004  . Smokeless tobacco: Not on file  . Alcohol Use: 1.2 - 1.8 oz/week    1-2 Cans of beer, 1 Glasses of wine per week   Objective:   BP 150/94 mmHg  Pulse 58  Temp(Src) 100.3 F (37.9 C) (Oral)  Resp 24  Ht 5' 8.5" (1.74 m)  Wt 195 lb (88.451 kg)  BMI 29.21 kg/m2  Physical Exam  General Appearance:    Alert, cooperative, no distress  HENT:   bilateral TM normal without fluid or infection, neck without nodes, throat normal without erythema or exudate, sinuses nontender and nasal mucosa pale and congested  Eyes:    PERRL, conjunctiva/corneas clear, EOM's intact  Lungs:     Mild diffuse expiratory wheezes, no rales or rhonchi, respirations unlabored  Heart:    Regular rate and rhythm  Neurologic:   Awake, alert, oriented x 3. No apparent focal neurological           defect.       Results for orders placed or performed in visit on 02/08/16  POCT Influenza A/B  Result Value Ref Range   Influenza A, POC Negative Negative   Influenza B, POC Negative Negative        Assessment & Plan:     1. Bronchitis  - cefdinir (OMNICEF) 300 MG capsule; Take 1 capsule (300 mg total) by mouth 2 (two) times daily.  Dispense: 20 capsule; Refill: 0 - albuterol  (PROVENTIL HFA;VENTOLIN HFA) 108 (90 Base) MCG/ACT inhaler; Inhale 2 puffs into the lungs every 6 (six) hours as needed for wheezing or shortness of breath.  Dispense: 1 Inhaler; Refill: 0  Call if symptoms change or if not rapidly improving.           Lelon Huh, MD  [facility]

## 2016-02-20 ENCOUNTER — Encounter: Payer: Self-pay | Admitting: Family Medicine

## 2016-02-20 ENCOUNTER — Ambulatory Visit (INDEPENDENT_AMBULATORY_CARE_PROVIDER_SITE_OTHER): Payer: Commercial Managed Care - HMO | Admitting: Family Medicine

## 2016-02-20 VITALS — BP 130/88 | HR 90 | Temp 98.8°F | Resp 20 | Wt 195.0 lb

## 2016-02-20 DIAGNOSIS — J40 Bronchitis, not specified as acute or chronic: Secondary | ICD-10-CM

## 2016-02-20 MED ORDER — HYDROCODONE-HOMATROPINE 5-1.5 MG/5ML PO SYRP: 5.0000 mL | ORAL_SOLUTION | Freq: Three times a day (TID) | ORAL | Status: DC | PRN

## 2016-02-20 MED ORDER — CEFDINIR 300 MG PO CAPS: 600.0000 mg | ORAL_CAPSULE | Freq: Every day | ORAL | Status: DC

## 2016-02-20 NOTE — Progress Notes (Signed)
Patient: Chris Nguyen Male    DOB: [birth year]   65 y.o.   MRN: [id number] Visit Date: 02/20/2016  Today's Provider: Lelon Huh, MD   Chief Complaint  Patient presents with  . Cough   Subjective:    Cough Episode onset: 2 weeks ago. The problem has been gradually improving. Cough characteristics: productive with clear phlegm. Associated symptoms include postnasal drip, shortness of breath and wheezing. Pertinent negatives include no chest pain, chills, ear congestion, ear pain, fever, headaches, heartburn, myalgias, nasal congestion, rash, rhinorrhea, sore throat or sweats. The symptoms are aggravated by lying down. Treatments tried: Antibiotics, Albuterol inhaler, Robitussin DM, Tylenol Sinus, Alka Selter plus, Coricidin HBP, NyQuil. The treatment provided mild relief. His past medical history is significant for bronchitis.  Patient was seen in the office on 02/08/2016 for Bronchitis. Patient was treated with Omnicef and Albuterol inhaler. Patient feels better today but reports he has some congestion in his chest. Cough is not nearly as severe, but is keeping him up at night.      Allergies  Allergen Reactions  . Spironolactone Other (See Comments)    Hyperkalemia  . Ziac [Bisoprolol-Hydrochlorothiazide]    Previous Medications   ALBUTEROL (PROVENTIL HFA;VENTOLIN HFA) 108 (90 BASE) MCG/ACT INHALER    Inhale 2 puffs into the lungs every 6 (six) hours as needed for wheezing or shortness of breath.   AMIODARONE (PACERONE) 200 MG TABLET    TAKE 1 TABLET DAILY FOR ATRIAL FIBRILLATION   ASPIRIN 81 MG CHEWABLE TABLET    Chew by mouth.   ATORVASTATIN (LIPITOR) 80 MG TABLET    TAKE 1 TABLET EVERY DAY   FOLIC ACID (FOLVITE) 1 MG TABLET    TAKE ONE TABLET BY MOUTH ONCE DAILY   FUROSEMIDE (LASIX) 40 MG TABLET    Take 1 tablet by mouth daily.   LISINOPRIL (PRINIVIL,ZESTRIL) 20 MG TABLET    Take 1 tablet by mouth daily.   METOPROLOL SUCCINATE (TOPROL-XL) 25 MG 24 HR TABLET    TAKE 1  TABLET EVERY DAY   SILDENAFIL (REVATIO) 20 MG TABLET    Take 5 tablets by mouth 30 minutes prior to intercourse.   TADALAFIL (CIALIS) 5 MG TABLET    Take 5 mg by mouth daily as needed for erectile dysfunction.   TRAMADOL HCL 50 MG TBDP    Take 50 mg by mouth 2 (two) times daily as needed.    Review of Systems  Constitutional: Positive for fatigue. Negative for fever, chills, diaphoresis and appetite change.  HENT: Positive for congestion (in chest) and postnasal drip. Negative for ear pain, rhinorrhea and sore throat.   Respiratory: Positive for cough (productive with clear phlegm ), shortness of breath and wheezing. Negative for chest tightness.   Cardiovascular: Negative for chest pain and palpitations.  Gastrointestinal: Negative for heartburn, nausea, vomiting and abdominal pain.  Musculoskeletal: Negative for myalgias.  Skin: Negative for rash.  Neurological: Negative for dizziness, light-headedness and headaches.    Social History  Substance Use Topics  . Smoking status: Former Smoker    Quit date: 12/01/2004  . Smokeless tobacco: Not on file  . Alcohol Use: 1.2 - 1.8 oz/week    1-2 Cans of beer, 1 Glasses of wine per week   Objective:   BP 130/88 mmHg  Pulse 90  Temp(Src) 98.8 F (37.1 C) (Oral)  Resp 20  Wt 195 lb (88.451 kg)  SpO2 95%  Physical Exam  General Appearance:    Alert, cooperative,  no distress  HENT:   ENT exam normal, no neck nodes or sinus tenderness  Eyes:    PERRL, conjunctiva/corneas clear, EOM's intact       Lungs:     Occasional expiratory wheeze, no rales, , respirations unlabored  Heart:    Regular rate and rhythm  Neurologic:   Awake, alert, oriented x 3. No apparent focal neurological           defect.           Assessment & Plan:     1. Bronchitis Improving but not resolved with persistent nocturnal cough, not responding to inhaler - HYDROcodone-homatropine (HYCODAN) 5-1.5 MG/5ML syrup; Take 5 mLs by mouth every 8 (eight) hours as  needed for cough.  Dispense: 120 mL; Refill: 0 - cefdinir (OMNICEF) 300 MG capsule; Take 2 capsules (600 mg total) by mouth daily.  Dispense: 20 capsule; Refill: 0  Call if symptoms change or if not rapidly improving.          Lelon Huh, MD  [facility]

## 2016-02-24 ENCOUNTER — Encounter: Payer: Self-pay | Admitting: Emergency Medicine

## 2016-02-24 ENCOUNTER — Emergency Department: Payer: Commercial Managed Care - HMO

## 2016-02-24 ENCOUNTER — Inpatient Hospital Stay
Admission: EM | Admit: 2016-02-24 | Discharge: 2016-02-25 | DRG: 291 | Disposition: A | Discharge status: Home / Self Care | Payer: Commercial Managed Care - HMO | Attending: Internal Medicine | Admitting: Internal Medicine

## 2016-02-24 DIAGNOSIS — Z7982 Long term (current) use of aspirin: Secondary | ICD-10-CM | POA: Diagnosis not present

## 2016-02-24 DIAGNOSIS — Z95810 Presence of automatic (implantable) cardiac defibrillator: Secondary | ICD-10-CM | POA: Diagnosis not present

## 2016-02-24 DIAGNOSIS — R531 Weakness: Secondary | ICD-10-CM

## 2016-02-24 DIAGNOSIS — I5023 Acute on chronic systolic (congestive) heart failure: Secondary | ICD-10-CM | POA: Diagnosis present

## 2016-02-24 DIAGNOSIS — Z951 Presence of aortocoronary bypass graft: Secondary | ICD-10-CM | POA: Diagnosis not present

## 2016-02-24 DIAGNOSIS — I10 Essential (primary) hypertension: Secondary | ICD-10-CM | POA: Diagnosis present

## 2016-02-24 DIAGNOSIS — Z83511 Family history of glaucoma: Secondary | ICD-10-CM | POA: Diagnosis not present

## 2016-02-24 DIAGNOSIS — M19032 Primary osteoarthritis, left wrist: Secondary | ICD-10-CM | POA: Diagnosis present

## 2016-02-24 DIAGNOSIS — E785 Hyperlipidemia, unspecified: Secondary | ICD-10-CM | POA: Diagnosis not present

## 2016-02-24 DIAGNOSIS — R0602 Shortness of breath: Secondary | ICD-10-CM | POA: Diagnosis not present

## 2016-02-24 DIAGNOSIS — N183 Chronic kidney disease, stage 3 unspecified: Secondary | ICD-10-CM | POA: Diagnosis present

## 2016-02-24 DIAGNOSIS — R7989 Other specified abnormal findings of blood chemistry: Secondary | ICD-10-CM

## 2016-02-24 DIAGNOSIS — Z833 Family history of diabetes mellitus: Secondary | ICD-10-CM | POA: Diagnosis not present

## 2016-02-24 DIAGNOSIS — M6281 Muscle weakness (generalized): Secondary | ICD-10-CM | POA: Diagnosis not present

## 2016-02-24 DIAGNOSIS — R0600 Dyspnea, unspecified: Secondary | ICD-10-CM

## 2016-02-24 DIAGNOSIS — Z9119 Patient's noncompliance with other medical treatment and regimen: Secondary | ICD-10-CM

## 2016-02-24 DIAGNOSIS — Z79899 Other long term (current) drug therapy: Secondary | ICD-10-CM

## 2016-02-24 DIAGNOSIS — I509 Heart failure, unspecified: Secondary | ICD-10-CM | POA: Diagnosis not present

## 2016-02-24 DIAGNOSIS — Z87891 Personal history of nicotine dependence: Secondary | ICD-10-CM | POA: Diagnosis not present

## 2016-02-24 DIAGNOSIS — I110 Hypertensive heart disease with heart failure: Secondary | ICD-10-CM | POA: Diagnosis not present

## 2016-02-24 DIAGNOSIS — Z8249 Family history of ischemic heart disease and other diseases of the circulatory system: Secondary | ICD-10-CM

## 2016-02-24 DIAGNOSIS — I255 Ischemic cardiomyopathy: Secondary | ICD-10-CM | POA: Diagnosis not present

## 2016-02-24 DIAGNOSIS — K219 Gastro-esophageal reflux disease without esophagitis: Secondary | ICD-10-CM | POA: Diagnosis present

## 2016-02-24 DIAGNOSIS — N529 Male erectile dysfunction, unspecified: Secondary | ICD-10-CM | POA: Diagnosis present

## 2016-02-24 DIAGNOSIS — Z8674 Personal history of sudden cardiac arrest: Secondary | ICD-10-CM

## 2016-02-24 DIAGNOSIS — J40 Bronchitis, not specified as acute or chronic: Secondary | ICD-10-CM | POA: Diagnosis not present

## 2016-02-24 DIAGNOSIS — R778 Other specified abnormalities of plasma proteins: Secondary | ICD-10-CM

## 2016-02-24 DIAGNOSIS — I130 Hypertensive heart and chronic kidney disease with heart failure and stage 1 through stage 4 chronic kidney disease, or unspecified chronic kidney disease: Secondary | ICD-10-CM | POA: Diagnosis not present

## 2016-02-24 DIAGNOSIS — N400 Enlarged prostate without lower urinary tract symptoms: Secondary | ICD-10-CM | POA: Diagnosis present

## 2016-02-24 DIAGNOSIS — R05 Cough: Secondary | ICD-10-CM | POA: Diagnosis not present

## 2016-02-24 DIAGNOSIS — I480 Paroxysmal atrial fibrillation: Secondary | ICD-10-CM | POA: Diagnosis not present

## 2016-02-24 DIAGNOSIS — I2510 Atherosclerotic heart disease of native coronary artery without angina pectoris: Secondary | ICD-10-CM | POA: Diagnosis present

## 2016-02-24 HISTORY — DX: Chronic systolic (congestive) heart failure: I50.22

## 2016-02-24 HISTORY — DX: Hyperlipidemia, unspecified: E78.5

## 2016-02-24 HISTORY — DX: Paroxysmal atrial fibrillation: I48.0

## 2016-02-24 LAB — CBC WITH DIFFERENTIAL/PLATELET
Basophils Absolute: 0.1 10*3/uL (ref 0–0.1)
Basophils Relative: 2 %
EOS ABS: 0.1 10*3/uL (ref 0–0.7)
EOS PCT: 1 %
HCT: 46.7 % (ref 40.0–52.0)
Hemoglobin: 15.8 g/dL (ref 13.0–18.0)
LYMPHS ABS: 0.9 10*3/uL — AB (ref 1.0–3.6)
LYMPHS PCT: 12 %
MCH: 29.9 pg (ref 26.0–34.0)
MCHC: 33.9 g/dL (ref 32.0–36.0)
MCV: 88.4 fL (ref 80.0–100.0)
MONO ABS: 0.7 10*3/uL (ref 0.2–1.0)
MONOS PCT: 9 %
Neutro Abs: 5.9 10*3/uL (ref 1.4–6.5)
Neutrophils Relative %: 76 %
PLATELETS: 451 10*3/uL — AB (ref 150–440)
RBC: 5.28 MIL/uL (ref 4.40–5.90)
RDW: 14.9 % — AB (ref 11.5–14.5)
WBC: 7.8 10*3/uL (ref 3.8–10.6)

## 2016-02-24 LAB — COMPREHENSIVE METABOLIC PANEL
ALBUMIN: 3.6 g/dL (ref 3.5–5.0)
ALK PHOS: 128 U/L — AB (ref 38–126)
ALT: 30 U/L (ref 17–63)
ANION GAP: 10 (ref 5–15)
AST: 33 U/L (ref 15–41)
BILIRUBIN TOTAL: 0.9 mg/dL (ref 0.3–1.2)
BUN: 15 mg/dL (ref 6–20)
CALCIUM: 8.8 mg/dL — AB (ref 8.9–10.3)
CO2: 25 mmol/L (ref 22–32)
Chloride: 100 mmol/L — ABNORMAL LOW (ref 101–111)
Creatinine, Ser: 1.71 mg/dL — ABNORMAL HIGH (ref 0.61–1.24)
GFR, EST AFRICAN AMERICAN: 47 mL/min — AB (ref >60)
GFR, EST NON AFRICAN AMERICAN: 40 mL/min — AB (ref >60)
GLUCOSE: 116 mg/dL — AB (ref 65–99)
POTASSIUM: 3.3 mmol/L — AB (ref 3.5–5.1)
Sodium: 135 mmol/L (ref 135–145)
TOTAL PROTEIN: 7.8 g/dL (ref 6.5–8.1)

## 2016-02-24 LAB — TROPONIN I: Troponin I: 0.06 ng/mL — ABNORMAL HIGH (ref <0.031)

## 2016-02-24 LAB — BRAIN NATRIURETIC PEPTIDE: B NATRIURETIC PEPTIDE 5: 844 pg/mL — AB (ref 0.0–100.0)

## 2016-02-24 MED ORDER — NITROGLYCERIN 2 % TD OINT
1.0000 [in_us] | TOPICAL_OINTMENT | Freq: Once | TRANSDERMAL | Status: AC
  Administered 2016-02-24: 1 [in_us] via TOPICAL
  Filled 2016-02-24: qty 1

## 2016-02-24 MED ORDER — FUROSEMIDE 10 MG/ML IJ SOLN
40.0000 mg | Freq: Once | INTRAMUSCULAR | Status: AC
  Administered 2016-02-24: 40 mg via INTRAVENOUS
  Filled 2016-02-24: qty 4

## 2016-02-24 NOTE — ED Notes (Signed)
Patient to Rm 17 via EMS from home for generalized weakness.  EMS reports recent diagnosis with bronchitis  EMS reports pulse oxi 96% on room air, [id number].

## 2016-02-24 NOTE — H&P (Signed)
[facility] [place] NAME: Chris Nguyen    MR#:  [id number]  DATE OF BIRTH:  14-Oct-1951  DATE OF ADMISSION:  02/24/2016  PRIMARY CARE PHYSICIAN: Lelon Huh, MD   REQUESTING/REFERRING PHYSICIAN: Jimmye Norman, M.D.  CHIEF COMPLAINT:   Chief Complaint  Patient presents with  . Fatigue    HISTORY OF PRESENT ILLNESS:  Chris Nguyen  is a 65 y.o. male who presents with Progressive shortness of breath, and some orthopnea. Patient states that for the past week or so he's had increasing symptoms. He felt like maybe he had some bronchitis, but his symptoms were getting worse. His symptoms include some orthopnea. He came to the ED for evaluation, and was found to have an elevated BNP. He has a history of significant heart failure with an EF something like 20% per chart review. He was given a dose of IV Lasix in the ED with some improvement in his symptoms. Hospitals were called for admission  PAST MEDICAL HISTORY:   Past Medical History  Diagnosis Date  . CAD (coronary artery disease) 05/14/2006  . Osteoarthritis of left wrist   . BPH (benign prostatic hyperplasia)   . Erectile dysfunction   . Cardiac arrest ([place]) 11/12/2013    Overview:  December 2014: ICD implanted  Last Assessment & Plan:  Relevant Hx: Course: Daily Update: Today's Plan:   . H/O coronary artery bypass surgery 05/17/2015    Overview:  LIMA to LAD   . Chronic systolic CHF (congestive heart failure) ([place])   . Paroxysmal atrial fibrillation (HCC)   . HLD (hyperlipidemia)     PAST SURGICAL HISTORY:   Past Surgical History  Procedure Laterality Date  . Cardiac catheterization  06/27/2008  . Coronary artery bypass graft  2006 and 2014    LIMA to LAD at [facility] 2014  . Mitral valve repair    . Carpal tunnel release      SOCIAL HISTORY:   Social History  Substance Use Topics  . Smoking status: Former Smoker    Quit date: 12/01/2004  . Smokeless tobacco: Not on file  .  Alcohol Use: 4.2 - 5.4 oz/week    1 Glasses of wine, 6-8 Cans of beer per week    FAMILY HISTORY:   Family History  Problem Relation Age of Onset  . CAD Mother 75  . Diabetes Mother     Diabetes Mellitus  . Dementia Father   . Glaucoma Father   . Heart disease      DRUG ALLERGIES:   Allergies  Allergen Reactions  . Spironolactone Other (See Comments)    Hyperkalemia  . Ziac [Bisoprolol-Hydrochlorothiazide]     MEDICATIONS AT HOME:   Prior to Admission medications   Medication Sig Start Date End Date Taking? Authorizing Provider  albuterol (PROVENTIL HFA;VENTOLIN HFA) 108 (90 Base) MCG/ACT inhaler Inhale 2 puffs into the lungs every 6 (six) hours as needed for wheezing or shortness of breath. 02/08/16  Yes Birdie Sons, MD  amiodarone (PACERONE) 200 MG tablet TAKE 1 TABLET DAILY FOR ATRIAL FIBRILLATION 12/11/15  Yes Birdie Sons, MD  aspirin 81 MG chewable tablet Chew by mouth.   Yes Historical Provider, MD  atorvastatin (LIPITOR) 80 MG tablet TAKE 1 TABLET EVERY DAY 02/05/16  Yes Birdie Sons, MD  cefdinir (OMNICEF) 300 MG capsule Take 2 capsules (600 mg total) by mouth daily. 02/20/16 03/01/16 Yes Birdie Sons, MD  folic acid (FOLVITE) 1 MG tablet TAKE ONE  TABLET BY MOUTH ONCE DAILY 07/28/15  Yes Birdie Sons, MD  furosemide (LASIX) 40 MG tablet Take 1 tablet by mouth daily. 08/30/15 08/29/16 Yes Historical Provider, MD  HYDROcodone-homatropine (HYCODAN) 5-1.5 MG/5ML syrup Take 5 mLs by mouth every 8 (eight) hours as needed for cough. 02/20/16  Yes Birdie Sons, MD  lisinopril (PRINIVIL,ZESTRIL) 20 MG tablet Take 1 tablet by mouth daily. 10/13/15  Yes Historical Provider, MD  metoprolol succinate (TOPROL-XL) 25 MG 24 hr tablet TAKE 1 TABLET EVERY DAY 12/11/15  Yes Birdie Sons, MD  sildenafil (REVATIO) 20 MG tablet Take 5 tablets by mouth 30 minutes prior to intercourse. 05/23/14  Yes Historical Provider, MD  tadalafil (CIALIS) 5 MG tablet Take 5 mg by mouth daily as  needed for erectile dysfunction. 11/07/14  Yes Historical Provider, MD  TraMADol HCl 50 MG TBDP Take 50 mg by mouth 2 (two) times daily as needed. 02/10/14  Yes Historical Provider, MD    REVIEW OF SYSTEMS:  Review of Systems  Constitutional: Negative for fever, chills, weight loss and malaise/fatigue.  HENT: Negative for ear pain, hearing loss and tinnitus.   Eyes: Negative for blurred vision, double vision, pain and redness.  Respiratory: Positive for cough, shortness of breath and wheezing. Negative for hemoptysis.   Cardiovascular: Positive for orthopnea. Negative for chest pain, palpitations and leg swelling.  Gastrointestinal: Negative for nausea, vomiting, abdominal pain, diarrhea and constipation.  Genitourinary: Negative for dysuria, frequency and hematuria.  Musculoskeletal: Negative for back pain, joint pain and neck pain.  Skin:       No acne, rash, or lesions  Neurological: Negative for dizziness, tremors, focal weakness and weakness.  Endo/Heme/Allergies: Negative for polydipsia. Does not bruise/bleed easily.  Psychiatric/Behavioral: Negative for depression. The patient is not nervous/anxious and does not have insomnia.      VITAL SIGNS:   Filed Vitals:   02/25/16 0015 02/25/16 0040 02/25/16 0109 02/25/16 0135  BP: 156/103 153/93 146/85 169/86  Pulse: 72 71 66 71  Temp:    98.1 F (36.7 C)  TempSrc:    Oral  Resp:   16 18  Height:      Weight:    84.641 kg (186 lb 9.6 oz)  SpO2: 91% 95% 94% 96%   Wt Readings from Last 3 Encounters:  02/25/16 84.641 kg (186 lb 9.6 oz)  02/20/16 88.451 kg (195 lb)  02/08/16 88.451 kg (195 lb)    PHYSICAL EXAMINATION:  Physical Exam  Vitals reviewed. Constitutional: He is oriented to person, place, and time. He appears well-developed and well-nourished. No distress.  HENT:  Head: Normocephalic and atraumatic.  Mouth/Throat: Oropharynx is clear and moist.  Eyes: Conjunctivae and EOM are normal. Pupils are equal, round, and  reactive to light. No scleral icterus.  Neck: Normal range of motion. Neck supple. No JVD present. No thyromegaly present.  Cardiovascular: Normal rate, regular rhythm and intact distal pulses.  Exam reveals no gallop and no friction rub.   No murmur heard. Respiratory: Effort normal. No respiratory distress. He has no wheezes. He has rales.  GI: Soft. Bowel sounds are normal. He exhibits no distension. There is no tenderness.  Musculoskeletal: Normal range of motion. He exhibits no edema.  No arthritis, no gout  Lymphadenopathy:    He has no cervical adenopathy.  Neurological: He is alert and oriented to person, place, and time. No cranial nerve deficit.  No dysarthria, no aphasia  Skin: Skin is warm and dry. No rash noted. No erythema.  Psychiatric: He has a normal mood and affect. His behavior is normal. Judgment and thought content normal.    LABORATORY PANEL:   CBC  Recent Labs Lab 02/24/16 2037  WBC 7.8  HGB 15.8  HCT 46.7  PLT 451*   ------------------------------------------------------------------------------------------------------------------  Chemistries   Recent Labs Lab 02/24/16 2037  NA 135  K 3.3*  CL 100*  CO2 25  GLUCOSE 116*  BUN 15  CREATININE 1.71*  CALCIUM 8.8*  AST 33  ALT 30  ALKPHOS 128*  BILITOT 0.9   ------------------------------------------------------------------------------------------------------------------  Cardiac Enzymes  Recent Labs Lab 02/24/16 2037  TROPONINI 0.06*   ------------------------------------------------------------------------------------------------------------------  RADIOLOGY:  Dg Chest Port 1 View  02/24/2016  CLINICAL DATA:  Patient has been feeling weak for a few days. Recently dx with bronchitis. Hx cad, cardiac arrest 2014, cabg 2006,2014, cardiac cath 2009, mitral valve repair, defibrillator EXAM: PORTABLE CHEST 1 VIEW COMPARISON:  11/04/2013 FINDINGS: There changes from previous cardiac surgery.  Cardiac silhouette is mildly enlarged. No mediastinal or hilar masses.  No evidence of adenopathy. Lungs are mildly hyperexpanded. There are prominent bronchovascular markings in the lung bases. An area of discoid opacity is noted in the left mid to lower lung, likely atelectasis. No convincing pneumonia. No overt pulmonary edema. No pneumothorax. Left anterior chest wall single lead pacemaker is well positioned. IMPRESSION: 1. No convincing acute cardiopulmonary disease. Electronically Signed   By: Lajean Manes M.D.   On: 02/24/2016 21:21    EKG:   Orders placed or performed during the hospital encounter of 02/24/16  . ED EKG  . ED EKG  . EKG 12-Lead  . EKG 12-Lead    IMPRESSION AND PLAN:  Principal Problem:   Acute on chronic systolic CHF (congestive heart failure) (HCC) - some improvement in symptoms with IV Lasix given in the ED. We'll give another dose of IV Lasix in the morning, then continue his home dose. We will get an echocardiogram, trend his cardiac enzymes, get a cardiology consult. Active Problems:   Hypertension - patient missed his antihypertensive medications at home today. We will reorder these here, and use additional when necessary antihypertensives as needed daily as blood pressure less than 160/100.   Paroxysmal a-fib (HCC) - currently sinus rhythm, continue home rate controlling medicines.   CAD (coronary artery disease) - continue home meds, trend cardiac enzymes above, low suspicion for ACS   Chronic kidney disease (CKD), stage III (moderate) - avoid nephrotoxins, monitor closely, patient is at baseline currently   HLD (hyperlipidemia) - continue home statin  All the records are reviewed and case discussed with ED provider. Management plans discussed with the patient and/or family.  DVT PROPHYLAXIS: SubQ lovenox  GI PROPHYLAXIS: None  ADMISSION STATUS: Inpatient  CODE STATUS: Full Code Status History    This patient does not have a recorded code status.  Please follow your organizational policy for patients in this situation.    Full Code  TOTAL TIME TAKING CARE OF THIS PATIENT: 40 minutes.    Chris Nguyen National 02/25/2016, 1:59 AM  [facility]  Office  [phone]  CC: Primary care physician; Lelon Huh, MD

## 2016-02-24 NOTE — ED Provider Notes (Signed)
[facility] Emergency Department Provider Note     Time seen: ----------------------------------------- 8:43 PM on 02/24/2016 -----------------------------------------    I have reviewed the triage vital signs and the nursing notes.   HISTORY  Chief Complaint Fatigue    HPI Chris Nguyen is a 65 y.o. male brought to the ER by EMS for weakness and shortness of breath. Patient states 2 weeks ago he was diagnosed with bronchitis, one week ago he went back to his doctor and was told it was still bronchitis. He denies fevers or chills, has had increased shortness of breath and difficulty with ambulation due to shortness of breath. Patient does have orthopnea, he did take his Lasix today. Did not take his blood pressure today, patient denies any chest pain at this time.   Past Medical History  Diagnosis Date  . CAD (coronary artery disease) 05/14/2006  . Osteoarthritis of left wrist   . BPH (benign prostatic hyperplasia)   . Erectile dysfunction   . Cardiac arrest ([place]) 11/12/2013    Overview:  December 2014: ICD implanted  Last Assessment & Plan:  Relevant Hx: Course: Daily Update: Today's Plan:   . H/O coronary artery bypass surgery 05/17/2015    Overview:  LIMA to LAD     Patient Active Problem List   Diagnosis Date Noted  . Chronic kidney disease (CKD), stage III (moderate) 05/17/2015  . Acid reflux 05/17/2015  . Heart failure, systolic ([place]) [id number]  . Hyperlipidemia   . Hypertension   . Hypertensive cardiomyopathy ([place])   . Cardiomyopathy ([place])   . Foot pain, left   . BPH (benign prostatic hyperplasia)   . Erectile dysfunction   . Left leg weakness   . Encounter for adjustment or management of automatic implantable cardioverter-defibrillator 02/07/2014  . A-fib ([place]) 12/26/2013  . Automatic implantable cardioverter-defibrillator in situ 12/15/2013  . Osteoarthritis of left wrist 09/27/2010  . CAD (coronary artery disease) 05/14/2006     Past Surgical History  Procedure Laterality Date  . Cardiac catheterization  06/27/2008  . Coronary artery bypass graft  2006 and 2014    LIMA to LAD at [facility] 2014  . Mitral valve repair    . Carpal tunnel release      Allergies Spironolactone and Ziac  Social History Social History  Substance Use Topics  . Smoking status: Former Smoker    Quit date: 12/01/2004  . Smokeless tobacco: None  . Alcohol Use: 1.2 - 1.8 oz/week    1-2 Cans of beer, 1 Glasses of wine per week    Review of Systems Constitutional: Negative for fever. Eyes: Negative for visual changes. ENT: Negative for sore throat. Cardiovascular: Negative for chest pain. Respiratory: Positive for shortness of breath Gastrointestinal: Negative for abdominal pain, vomiting and diarrhea. Genitourinary: Negative for dysuria. Musculoskeletal: Negative for back pain. Skin: Negative for rash. Neurological: Positive for generalized weakness  10-point ROS otherwise negative.  ____________________________________________   PHYSICAL EXAM:  VITAL SIGNS: ED Triage Vitals  Enc Vitals Group     BP 02/24/16 2032 184/122 mmHg     Pulse Rate 02/24/16 2032 92     Resp 02/24/16 2032 20     Temp --      Temp Source 02/24/16 2032 Oral     SpO2 02/24/16 2032 96 %     Weight 02/24/16 2032 193 lb (87.544 kg)     Height 02/24/16 2032 5\' 9"  (1.753 m)     Head Cir --      Peak  Flow --      Pain Score --      Pain Loc --      Pain Edu? --      Excl. in [place]? --     Constitutional: Alert and oriented. Mild distress Eyes: Conjunctivae are normal. PERRL. Normal extraocular movements. ENT   Head: Normocephalic and atraumatic.   Nose: No congestion/rhinnorhea.   Mouth/Throat: Mucous membranes are moist.   Neck: No stridor. Cardiovascular: Normal rate, regular rhythm. Normal and symmetric distal pulses are present in all extremities. Slight murmur is noted Respiratory: Tachypnea with mildRhonchi  bilaterally Gastrointestinal: Soft and nontender. No distention. No abdominal bruits.  Musculoskeletal: Nontender with normal range of motion in all extremities. No joint effusions.  No lower extremity tenderness nor edema. Neurologic:  Normal speech and language. No gross focal neurologic deficits are appreciated.  Skin:  Skin is warm, dry and intact. No rash noted. Psychiatric: Mood and affect are normal. Speech and behavior are normal. Patient exhibits appropriate insight and judgment. ____________________________________________  EKG: Interpreted by me. Sinus rhythm rate 88 bpm, normal PR interval, wide QRS, long QT interval. LVH, normal axis.  ____________________________________________  ED COURSE:  Pertinent labs & imaging results that were available during my care of the patient were reviewed by me and considered in my medical decision making (see chart for details). Patient with weakness and shortness of breath, likely CHF exacerbation. I will check basic labs and reevaluate. ____________________________________________    LABS (pertinent positives/negatives)  Labs Reviewed  CBC WITH DIFFERENTIAL/PLATELET - Abnormal; Notable for the following:    RDW 14.9 (*)    Platelets 451 (*)    Lymphs Abs 0.9 (*)    All other components within normal limits  BRAIN NATRIURETIC PEPTIDE - Abnormal; Notable for the following:    B Natriuretic Peptide 844.0 (*)    All other components within normal limits  TROPONIN I - Abnormal; Notable for the following:    Troponin I 0.06 (*)    All other components within normal limits  COMPREHENSIVE METABOLIC PANEL - Abnormal; Notable for the following:    Potassium 3.3 (*)    Chloride 100 (*)    Glucose, Bld 116 (*)    Creatinine, Ser 1.71 (*)    Calcium 8.8 (*)    Alkaline Phosphatase 128 (*)    GFR calc non Af Amer 40 (*)    GFR calc Af Amer 47 (*)    All other components within normal limits    RADIOLOGY Chest x-ray FINDINGS: There  changes from previous cardiac surgery. Cardiac silhouette is mildly enlarged.  No mediastinal or hilar masses. No evidence of adenopathy.  Lungs are mildly hyperexpanded. There are prominent bronchovascular markings in the lung bases. An area of discoid opacity is noted in the left mid to lower lung, likely atelectasis. No convincing pneumonia. No overt pulmonary edema. No pneumothorax.  Left anterior chest wall single lead pacemaker is well positioned.  IMPRESSION: 1. No convincing acute cardiopulmonary disease.  ____________________________________________  FINAL ASSESSMENT AND PLAN  Weakness, shortness of breath, elevated troponin, history of congestive heart failure  Plan: Patient with labs and imaging as dictated above. Patient presents with now 2 weeks of worsening shortness of breath and weakness. I will give him additional Lasix, he may need a repeat echocardiogram and cardiology consultation. I'm doubtful that this is bronchitis at this point.   Earleen Newport, MD   Earleen Newport, MD 02/24/16 [phone]

## 2016-02-25 ENCOUNTER — Inpatient Hospital Stay
Admit: 2016-02-25 | Discharge: 2016-02-25 | Disposition: A | Discharge status: Home / Self Care | Payer: Commercial Managed Care - HMO | Attending: Internal Medicine | Admitting: Internal Medicine

## 2016-02-25 DIAGNOSIS — E785 Hyperlipidemia, unspecified: Secondary | ICD-10-CM | POA: Diagnosis present

## 2016-02-25 LAB — BASIC METABOLIC PANEL
ANION GAP: 9 (ref 5–15)
BUN: 15 mg/dL (ref 6–20)
CALCIUM: 8.7 mg/dL — AB (ref 8.9–10.3)
CO2: 28 mmol/L (ref 22–32)
CREATININE: 1.67 mg/dL — AB (ref 0.61–1.24)
Chloride: 100 mmol/L — ABNORMAL LOW (ref 101–111)
GFR calc Af Amer: 48 mL/min — ABNORMAL LOW (ref >60)
GFR, EST NON AFRICAN AMERICAN: 41 mL/min — AB (ref >60)
GLUCOSE: 94 mg/dL (ref 65–99)
Potassium: 3.3 mmol/L — ABNORMAL LOW (ref 3.5–5.1)
Sodium: 137 mmol/L (ref 135–145)

## 2016-02-25 LAB — CBC
HCT: 45.1 % (ref 40.0–52.0)
Hemoglobin: 15.2 g/dL (ref 13.0–18.0)
MCH: 29.4 pg (ref 26.0–34.0)
MCHC: 33.8 g/dL (ref 32.0–36.0)
MCV: 87.1 fL (ref 80.0–100.0)
PLATELETS: 431 10*3/uL (ref 150–440)
RBC: 5.18 MIL/uL (ref 4.40–5.90)
RDW: 15.3 % — AB (ref 11.5–14.5)
WBC: 7.8 10*3/uL (ref 3.8–10.6)

## 2016-02-25 LAB — TROPONIN I
TROPONIN I: 0.11 ng/mL — AB (ref <0.031)
TROPONIN I: 0.06 ng/mL — AB (ref <0.031)
Troponin I: 0.06 ng/mL — ABNORMAL HIGH (ref <0.031)

## 2016-02-25 LAB — ECHOCARDIOGRAM COMPLETE
HEIGHTINCHES: 69 in
WEIGHTICAEL: 2985.6 [oz_av]

## 2016-02-25 MED ORDER — LISINOPRIL 20 MG PO TABS
20.0000 mg | ORAL_TABLET | Freq: Every day | ORAL | Status: DC
  Administered 2016-02-25: 20 mg via ORAL
  Filled 2016-02-25: qty 1

## 2016-02-25 MED ORDER — FUROSEMIDE 40 MG PO TABS
40.0000 mg | ORAL_TABLET | Freq: Every day | ORAL | Status: DC
  Administered 2016-02-25: 40 mg via ORAL
  Filled 2016-02-25: qty 1

## 2016-02-25 MED ORDER — HYDRALAZINE HCL 20 MG/ML IJ SOLN: 10.0000 mg | INTRAMUSCULAR | Status: DC | PRN

## 2016-02-25 MED ORDER — AMIODARONE HCL 200 MG PO TABS
200.0000 mg | ORAL_TABLET | Freq: Every day | ORAL | Status: DC
  Administered 2016-02-25: 200 mg via ORAL
  Filled 2016-02-25: qty 1

## 2016-02-25 MED ORDER — POTASSIUM CHLORIDE CRYS ER 20 MEQ PO TBCR
40.0000 meq | EXTENDED_RELEASE_TABLET | Freq: Every day | ORAL | Status: DC
  Administered 2016-02-25: 40 meq via ORAL
  Filled 2016-02-25: qty 2

## 2016-02-25 MED ORDER — ACETAMINOPHEN 325 MG PO TABS
650.0000 mg | ORAL_TABLET | Freq: Four times a day (QID) | ORAL | Status: DC | PRN
  Administered 2016-02-25: 650 mg via ORAL
  Filled 2016-02-25: qty 2

## 2016-02-25 MED ORDER — ACETAMINOPHEN 650 MG RE SUPP: 650.0000 mg | Freq: Four times a day (QID) | RECTAL | Status: DC | PRN

## 2016-02-25 MED ORDER — ASPIRIN 81 MG PO CHEW
81.0000 mg | CHEWABLE_TABLET | Freq: Every day | ORAL | Status: DC
  Administered 2016-02-25: 81 mg via ORAL
  Filled 2016-02-25: qty 1

## 2016-02-25 MED ORDER — FUROSEMIDE 10 MG/ML IJ SOLN
20.0000 mg | Freq: Once | INTRAMUSCULAR | Status: AC
  Administered 2016-02-25: 20 mg via INTRAVENOUS
  Filled 2016-02-25: qty 2

## 2016-02-25 MED ORDER — ATORVASTATIN CALCIUM 20 MG PO TABS
80.0000 mg | ORAL_TABLET | Freq: Every day | ORAL | Status: DC
  Administered 2016-02-25: 80 mg via ORAL
  Filled 2016-02-25: qty 4

## 2016-02-25 MED ORDER — SODIUM CHLORIDE 0.9% FLUSH
3.0000 mL | Freq: Two times a day (BID) | INTRAVENOUS | Status: DC
  Administered 2016-02-25 (×2): 3 mL via INTRAVENOUS

## 2016-02-25 MED ORDER — METOPROLOL SUCCINATE ER 25 MG PO TB24
25.0000 mg | ORAL_TABLET | Freq: Every day | ORAL | Status: DC
  Administered 2016-02-25: 25 mg via ORAL
  Filled 2016-02-25: qty 1

## 2016-02-25 MED ORDER — ENOXAPARIN SODIUM 40 MG/0.4ML ~~LOC~~ SOLN
40.0000 mg | SUBCUTANEOUS | Status: DC
  Administered 2016-02-25: 40 mg via SUBCUTANEOUS
  Filled 2016-02-25: qty 0.4

## 2016-02-25 NOTE — Progress Notes (Signed)
*  PRELIMINARY RESULTS* Echocardiogram 2D Echocardiogram has been performed.  Chris Nguyen 02/25/2016, 2:06 PM

## 2016-02-25 NOTE — ED Notes (Signed)
Patient transported to room 251

## 2016-02-25 NOTE — Consult Note (Signed)
Reason for Consult: Shortness of breath/congestive heart failure Referring Physician: Dr. Lance Coon hospitalist/Dr. Lelon Huh primary Cardiologist: Lanier Prude is an 65 y.o. male.  HPI: 65 year old black male known history of multiple cardiac problems include mitral valvular disease status post. He's had significant cardiomyopathy systolic dysfunction probably ischemic. COPD smoking status post sudden death cardiac arrest now has an implantable defibrillator without discharge. Chronic renal insufficiency followed by nephrology history of paroxysmal atrial fibrillation poor anticoagulation candidate because of bleeding. History of coronary bypass surgery LIMA to the LAD after MI. Heart failure treatment with systolic dysfunction treatment with beta blocker ACE inhibitor hydralazine. Patient complains of worsening shortness of breath recently leg edema so came to the hospital. Patient complains of vague chest tightness no blackout spell syncope. Patient has been treated with Lasix therapy. He has not been compliant because of increased frequency and urgency now developed heart failure. Patient's been treated with intravenous Lasix in hospital which is improved his symptoms.  Past Medical History  Diagnosis Date  . CAD (coronary artery disease) 05/14/2006  . Osteoarthritis of left wrist   . BPH (benign prostatic hyperplasia)   . Erectile dysfunction   . Cardiac arrest ([place]) 11/12/2013    Overview:  December 2014: ICD implanted  Last Assessment & Plan:  Relevant Hx: Course: Daily Update: Today's Plan:   . H/O coronary artery bypass surgery 05/17/2015    Overview:  LIMA to LAD   . Chronic systolic CHF (congestive heart failure) ([place])   . Paroxysmal atrial fibrillation (HCC)   . HLD (hyperlipidemia)     Past Surgical History  Procedure Laterality Date  . Cardiac catheterization  06/27/2008  . Coronary artery bypass graft  2006 and 2014    LIMA to LAD at [facility] 2014  . Mitral valve  repair    . Carpal tunnel release      Family History  Problem Relation Age of Onset  . CAD Mother 98  . Diabetes Mother     Diabetes Mellitus  . Dementia Father   . Glaucoma Father   . Heart disease      Social History:  reports that he quit smoking about 11 years ago. He does not have any smokeless tobacco history on file. He reports that he drinks about 4.2 - 5.4 oz of alcohol per week. He reports that he does not use illicit drugs.  Allergies:  Allergies  Allergen Reactions  . Spironolactone Other (See Comments)    Hyperkalemia  . Ziac [Bisoprolol-Hydrochlorothiazide]     Medications: I have reviewed the patient's current medications.  Results for orders placed or performed during the hospital encounter of 02/24/16 (from the past 48 hour(s))  CBC with Differential     Status: Abnormal   Collection Time: 02/24/16  8:37 PM  Result Value Ref Range   WBC 7.8 3.8 - 10.6 K/uL   RBC 5.28 4.40 - 5.90 MIL/uL   Hemoglobin 15.8 13.0 - 18.0 g/dL   HCT 46.7 40.0 - 52.0 %   MCV 88.4 80.0 - 100.0 fL   MCH 29.9 26.0 - 34.0 pg   MCHC 33.9 32.0 - 36.0 g/dL   RDW 14.9 (H) 11.5 - 14.5 %   Platelets 451 (H) 150 - 440 K/uL   Neutrophils Relative % 76 %   Neutro Abs 5.9 1.4 - 6.5 K/uL   Lymphocytes Relative 12 %   Lymphs Abs 0.9 (L) 1.0 - 3.6 K/uL   Monocytes Relative 9 %   Monocytes Absolute  0.7 0.2 - 1.0 K/uL   Eosinophils Relative 1 %   Eosinophils Absolute 0.1 0 - 0.7 K/uL   Basophils Relative 2 %   Basophils Absolute 0.1 0 - 0.1 K/uL  Brain natriuretic peptide     Status: Abnormal   Collection Time: 02/24/16  8:37 PM  Result Value Ref Range   B Natriuretic Peptide 844.0 (H) 0.0 - 100.0 pg/mL  Troponin I     Status: Abnormal   Collection Time: 02/24/16  8:37 PM  Result Value Ref Range   Troponin I 0.06 (H) <0.031 ng/mL    Comment: READ BACK AND VERIFIED WITH HUNTER ORE AT 2116 ON 02/24/16 RWW        PERSISTENTLY INCREASED TROPONIN VALUES IN THE RANGE OF 0.04-0.49 ng/mL  CAN BE SEEN IN:       -UNSTABLE ANGINA       -CONGESTIVE HEART FAILURE       -MYOCARDITIS       -CHEST TRAUMA       -ARRYHTHMIAS       -LATE PRESENTING MYOCARDIAL INFARCTION       -COPD   CLINICAL FOLLOW-UP RECOMMENDED.   Comprehensive metabolic panel     Status: Abnormal   Collection Time: 02/24/16  8:37 PM  Result Value Ref Range   Sodium 135 135 - 145 mmol/L   Potassium 3.3 (L) 3.5 - 5.1 mmol/L   Chloride 100 (L) 101 - 111 mmol/L   CO2 25 22 - 32 mmol/L   Glucose, Bld 116 (H) 65 - 99 mg/dL   BUN 15 6 - 20 mg/dL   Creatinine, Ser 1.71 (H) 0.61 - 1.24 mg/dL   Calcium 8.8 (L) 8.9 - 10.3 mg/dL   Total Protein 7.8 6.5 - 8.1 g/dL   Albumin 3.6 3.5 - 5.0 g/dL   AST 33 15 - 41 U/L   ALT 30 17 - 63 U/L   Alkaline Phosphatase 128 (H) 38 - 126 U/L   Total Bilirubin 0.9 0.3 - 1.2 mg/dL   GFR calc non Af Amer 40 (L) >60 mL/min   GFR calc Af Amer 47 (L) >60 mL/min    Comment: (NOTE) The eGFR has been calculated using the CKD EPI equation. This calculation has not been validated in all clinical situations. eGFR's persistently <60 mL/min signify possible Chronic Kidney Disease.    Anion gap 10 5 - 15  Basic metabolic panel     Status: Abnormal   Collection Time: 02/25/16  2:33 AM  Result Value Ref Range   Sodium 137 135 - 145 mmol/L   Potassium 3.3 (L) 3.5 - 5.1 mmol/L   Chloride 100 (L) 101 - 111 mmol/L   CO2 28 22 - 32 mmol/L   Glucose, Bld 94 65 - 99 mg/dL   BUN 15 6 - 20 mg/dL   Creatinine, Ser 1.67 (H) 0.61 - 1.24 mg/dL   Calcium 8.7 (L) 8.9 - 10.3 mg/dL   GFR calc non Af Amer 41 (L) >60 mL/min   GFR calc Af Amer 48 (L) >60 mL/min    Comment: (NOTE) The eGFR has been calculated using the CKD EPI equation. This calculation has not been validated in all clinical situations. eGFR's persistently <60 mL/min signify possible Chronic Kidney Disease.    Anion gap 9 5 - 15  CBC     Status: Abnormal   Collection Time: 02/25/16  2:33 AM  Result Value Ref Range   WBC 7.8 3.8 -  10.6 K/uL   RBC 5.18  4.40 - 5.90 MIL/uL   Hemoglobin 15.2 13.0 - 18.0 g/dL   HCT 45.1 40.0 - 52.0 %   MCV 87.1 80.0 - 100.0 fL   MCH 29.4 26.0 - 34.0 pg   MCHC 33.8 32.0 - 36.0 g/dL   RDW 15.3 (H) 11.5 - 14.5 %   Platelets 431 150 - 440 K/uL  Troponin I     Status: Abnormal   Collection Time: 02/25/16  2:33 AM  Result Value Ref Range   Troponin I 0.06 (H) <0.031 ng/mL    Comment: PREVIOUS RESULT CALLED AT 2116 02/24/16.PMH        PERSISTENTLY INCREASED TROPONIN VALUES IN THE RANGE OF 0.04-0.49 ng/mL CAN BE SEEN IN:       -UNSTABLE ANGINA       -CONGESTIVE HEART FAILURE       -MYOCARDITIS       -CHEST TRAUMA       -ARRYHTHMIAS       -LATE PRESENTING MYOCARDIAL INFARCTION       -COPD   CLINICAL FOLLOW-UP RECOMMENDED.   Troponin I     Status: Abnormal   Collection Time: 02/25/16  7:01 AM  Result Value Ref Range   Troponin I 0.11 (H) <0.031 ng/mL    Comment: PREVIOUS RESULT CALLED 02/24/16 AT 2116 BY RWW/DAS        PERSISTENTLY INCREASED TROPONIN VALUES IN THE RANGE OF 0.04-0.49 ng/mL CAN BE SEEN IN:       -UNSTABLE ANGINA       -CONGESTIVE HEART FAILURE       -MYOCARDITIS       -CHEST TRAUMA       -ARRYHTHMIAS       -LATE PRESENTING MYOCARDIAL INFARCTION       -COPD   CLINICAL FOLLOW-UP RECOMMENDED.     Dg Chest Port 1 View  02/24/2016  CLINICAL DATA:  Patient has been feeling weak for a few days. Recently dx with bronchitis. Hx cad, cardiac arrest 2014, cabg 2006,2014, cardiac cath 2009, mitral valve repair, defibrillator EXAM: PORTABLE CHEST 1 VIEW COMPARISON:  11/04/2013 FINDINGS: There changes from previous cardiac surgery. Cardiac silhouette is mildly enlarged. No mediastinal or hilar masses.  No evidence of adenopathy. Lungs are mildly hyperexpanded. There are prominent bronchovascular markings in the lung bases. An area of discoid opacity is noted in the left mid to lower lung, likely atelectasis. No convincing pneumonia. No overt pulmonary edema. No pneumothorax.  Left anterior chest wall single lead pacemaker is well positioned. IMPRESSION: 1. No convincing acute cardiopulmonary disease. Electronically Signed   By: Lajean Manes M.D.   On: 02/24/2016 21:21    Review of Systems  Constitutional: Positive for malaise/fatigue.  HENT: Positive for congestion.   Eyes: Negative.   Respiratory: Positive for shortness of breath.   Cardiovascular: Positive for orthopnea, leg swelling and PND.  Gastrointestinal: Positive for heartburn and abdominal pain.  Musculoskeletal: Negative.   Skin: Negative.   Neurological: Positive for tingling, focal weakness and weakness.  Endo/Heme/Allergies: Negative.   Psychiatric/Behavioral: Negative.    Blood pressure 148/99, pulse 74, temperature 97.6 F (36.4 C), temperature source Oral, resp. rate 18, height _0  (1.753 m), weight 84.641 kg (186 lb 9.6 oz), SpO2 98 %. Physical Exam  Assessment/Plan: Congestive heart failure systolic dysfunction Shortness of breath related heart failure Chronic renal insufficiency stage III Fatigue Cardiomyopathy systolic Mitral valvular disease status post repair Paroxysmal atrial fibrillation Coronary artery disease Hyperlipidemia History of AICD in place GERD Hypertension Generalized weakness Borderline  troponin/probably demand ischemia . PLAN Agree with admission to rule out for myocardial infarction Agree with telemetry for evaluation of possible arrhythmias Recommended outpatient follow-up with nephrology for renal insufficiency Continue hydralazine lisinopril metoprolol for heart failure Hypertension control with metoprolol lisinopril hydralazine Atrial fibrillation rhythm control with amiodarone Consider echocardiogram for assessment of LV function and valvular structures Poor anticoagulation candidate because of bleeding in the past DVT prophylaxis with Lovenox short-term Continue Lasix therapy for heart failure Recommend modest weight loss and  exercise Follow-up AICD interrogation If patient improves will recommend outpatient follow-up with cardiology one to 2 weeks   Kimble Hitchens D. 02/25/2016, 1:30 PM

## 2016-02-25 NOTE — Care Management Important Message (Signed)
Important Message  Patient Details  Name: Chris Nguyen MRN: [id number] Date of Birth: [birth year]   Medicare Important Message Given:  Yes    Katrina Stack, RN 02/25/2016, 9:05 AM

## 2016-02-25 NOTE — Patient Instructions (Signed)
Follow up with the [facility] Failure Clinic March 12, 2016 at 11:00am  [place] [phone]

## 2016-02-25 NOTE — Discharge Summary (Signed)
[facility] [place] NAME: Chris Nguyen    MR#:  [id number]  DATE OF BIRTH:  June 11, 1951  DATE OF ADMISSION:  02/24/2016 ADMITTING PHYSICIAN: Lance Coon, MD  DATE OF DISCHARGE: 02/25/2016  PRIMARY CARE PHYSICIAN: Lelon Huh, MD    ADMISSION DIAGNOSIS:  Weakness [R53.1] Dyspnea [R06.00] Elevated troponin I level [R79.89] Acute on chronic congestive heart failure, unspecified congestive heart failure type ([place]) [I50.9]  DISCHARGE DIAGNOSIS:  Principal Problem:   Acute on chronic systolic CHF (congestive heart failure) (HCC) Active Problems:   Hypertension   CAD (coronary artery disease)   Paroxysmal a-fib (HCC)   Chronic kidney disease (CKD), stage III (moderate)   HLD (hyperlipidemia)   SECONDARY DIAGNOSIS:   Past Medical History  Diagnosis Date  . CAD (coronary artery disease) 05/14/2006  . Osteoarthritis of left wrist   . BPH (benign prostatic hyperplasia)   . Erectile dysfunction   . Cardiac arrest ([place]) 11/12/2013    Overview:  December 2014: ICD implanted  Last Assessment & Plan:  Relevant Hx: Course: Daily Update: Today's Plan:   . H/O coronary artery bypass surgery 05/17/2015    Overview:  LIMA to LAD   . Chronic systolic CHF (congestive heart failure) ([place])   . Paroxysmal atrial fibrillation (HCC)   . HLD (hyperlipidemia)     HOSPITAL COURSE:  65 year old male with a history of ischemic cardiomyopathy EF of less than 15% who presented with acute on chronic systolic heart failure.    1. Acute on chronic systolic heart failure EF 20-25%: Patient has diuresed well with IV Lasix. He will need to continue metoprolol, Lasix and lisinopril. ECHO shows Biventricular enlargement mod/severe  EF=20-25%  Pacer lead in RV  Mod TR/MR  Mod/ Severe thickening of MV annulous   2. PAF: Continue amiodarone and metoprolol for heart rate control. 3. Essential hypertension: Blood pressureimproved. Continue  metoprolol and lisinopril.  4.. ASCVD: Continue aspirin, statin, metoprolol, lisinopril. 5. Chronic kidney disease stage III: Creatinine at baseline.  6. Hyperlipidemia: Continue atorvastatin.   DISCHARGE CONDITIONS AND DIET:  Stable for discharge Cardiac diet  CONSULTS OBTAINED:  Treatment Team:  Yolonda Kida, MD  DRUG ALLERGIES:   Allergies  Allergen Reactions  . Spironolactone Other (See Comments)    Hyperkalemia  . Ziac [Bisoprolol-Hydrochlorothiazide]     DISCHARGE MEDICATIONS:   Current Discharge Medication List    CONTINUE these medications which have NOT CHANGED   Details  albuterol (PROVENTIL HFA;VENTOLIN HFA) 108 (90 Base) MCG/ACT inhaler Inhale 2 puffs into the lungs every 6 (six) hours as needed for wheezing or shortness of breath. Qty: 1 Inhaler, Refills: 0   Associated Diagnoses: Bronchitis    amiodarone (PACERONE) 200 MG tablet TAKE 1 TABLET DAILY FOR ATRIAL FIBRILLATION Qty: 90 tablet, Refills: 3    aspirin 81 MG chewable tablet Chew by mouth.    atorvastatin (LIPITOR) 80 MG tablet TAKE 1 TABLET EVERY DAY Qty: 90 tablet, Refills: 3    folic acid (FOLVITE) 1 MG tablet TAKE ONE TABLET BY MOUTH ONCE DAILY Qty: 30 tablet, Refills: 6    furosemide (LASIX) 40 MG tablet Take 1 tablet by mouth daily.    HYDROcodone-homatropine (HYCODAN) 5-1.5 MG/5ML syrup Take 5 mLs by mouth every 8 (eight) hours as needed for cough. Qty: 120 mL, Refills: 0   Associated Diagnoses: Bronchitis    lisinopril (PRINIVIL,ZESTRIL) 20 MG tablet Take 1 tablet by mouth daily.    metoprolol succinate (TOPROL-XL) 25 MG 24 hr  tablet TAKE 1 TABLET EVERY DAY Qty: 90 tablet, Refills: 3    sildenafil (REVATIO) 20 MG tablet Take 5 tablets by mouth 30 minutes prior to intercourse.    tadalafil (CIALIS) 5 MG tablet Take 5 mg by mouth daily as needed for erectile dysfunction.    TraMADol HCl 50 MG TBDP Take 50 mg by mouth 2 (two) times daily as needed.      STOP taking these  medications     cefdinir (OMNICEF) 300 MG capsule               Today   CHIEF COMPLAINT:  Doing well denies SOB or CP improved with IV LASIX   VITAL SIGNS:  Blood pressure 148/99, pulse 74, temperature 97.6 F (36.4 C), temperature source Oral, resp. rate 18, height 5\' 9"  (1.753 m), weight 84.641 kg (186 lb 9.6 oz), SpO2 98 %.   REVIEW OF SYSTEMS:  Review of Systems  Constitutional: Negative for fever, chills and malaise/fatigue.  HENT: Negative for ear discharge, ear pain, hearing loss, nosebleeds and sore throat.   Eyes: Negative for blurred vision and pain.  Respiratory: Negative for cough, hemoptysis, shortness of breath and wheezing.   Cardiovascular: Negative for chest pain, palpitations and leg swelling.  Gastrointestinal: Negative for nausea, vomiting, abdominal pain, diarrhea and blood in stool.  Genitourinary: Negative for dysuria.  Musculoskeletal: Negative for back pain.  Neurological: Negative for dizziness, tremors, speech change, focal weakness, seizures and headaches.  Endo/Heme/Allergies: Does not bruise/bleed easily.  Psychiatric/Behavioral: Negative for depression, suicidal ideas and hallucinations.     PHYSICAL EXAMINATION:  GENERAL:  65 y.o.-year-old patient lying in the bed with no acute distress.  NECK:  Supple, no jugular venous distention. No thyroid enlargement, no tenderness.  LUNGS: Normal breath sounds bilaterally, no wheezing, rales,rhonchi  No use of accessory muscles of respiration.  CARDIOVASCULAR: S1, S2 normal. 3/6 SEmurmur, NOrubs, or gallops.  ABDOMEN: Soft, non-tender, non-distended. Bowel sounds present. No organomegaly or mass.  EXTREMITIES: No pedal edema, cyanosis, or clubbing.  PSYCHIATRIC: The patient is alert and oriented x 3.  SKIN: No obvious rash, lesion, or ulcer.   DATA REVIEW:   CBC  Recent Labs Lab 02/25/16 0233  WBC 7.8  HGB 15.2  HCT 45.1  PLT 431    Chemistries   Recent Labs Lab 02/24/16 2037  02/25/16 0233  NA 135 137  K 3.3* 3.3*  CL 100* 100*  CO2 25 28  GLUCOSE 116* 94  BUN 15 15  CREATININE 1.71* 1.67*  CALCIUM 8.8* 8.7*  AST 33  --   ALT 30  --   ALKPHOS 128*  --   BILITOT 0.9  --     Cardiac Enzymes  Recent Labs Lab 02/25/16 0233 02/25/16 0701 02/25/16 1302  TROPONINI 0.06* 0.11* 0.06*    Microbiology Results  @MICRORSLT48 @  RADIOLOGY:  Dg Chest Port 1 View  02/24/2016  CLINICAL DATA:  Patient has been feeling weak for a few days. Recently dx with bronchitis. Hx cad, cardiac arrest 2014, cabg 2006,2014, cardiac cath 2009, mitral valve repair, defibrillator EXAM: PORTABLE CHEST 1 VIEW COMPARISON:  11/04/2013 FINDINGS: There changes from previous cardiac surgery. Cardiac silhouette is mildly enlarged. No mediastinal or hilar masses.  No evidence of adenopathy. Lungs are mildly hyperexpanded. There are prominent bronchovascular markings in the lung bases. An area of discoid opacity is noted in the left mid to lower lung, likely atelectasis. No convincing pneumonia. No overt pulmonary edema. No pneumothorax. Left anterior chest wall single  lead pacemaker is well positioned. IMPRESSION: 1. No convincing acute cardiopulmonary disease. Electronically Signed   By: Lajean Manes M.D.   On: 02/24/2016 21:21      Management plans discussed with the patient and he is in agreement. Stable for discharge home  Patient should follow up with Dr Clayborn Bigness in 1 week  CODE STATUS:     Code Status Orders        Start     Ordered   02/25/16 0059  Full code   Continuous     02/25/16 0058    Code Status History    Date Active Date Inactive Code Status Order ID Comments User Context   This patient has a current code status but no historical code status.      TOTAL TIME TAKING CARE OF THIS PATIENT: 36 minutes.    Note: This dictation was prepared with Dragon dictation along with smaller phrase technology. Any transcriptional errors that result from this process are  unintentional.  Assata Juncaj M.D on 02/25/2016 at 3:36 PM  Between 7am to 6pm - Pager - [phone] After 6pm go to [email or web address] - password [email or web address] [facility]  Office  [phone]  CC: Primary care physician; Lelon Huh, MD

## 2016-02-25 NOTE — Progress Notes (Signed)
[facility] at [place] NAME: Chris Nguyen    MR#:  [id number]  DATE OF BIRTH:  [birth year]  SUBJECTIVE:   doing well this am SOB resolved  REVIEW OF SYSTEMS:    Review of Systems  Constitutional: Negative for fever, chills and malaise/fatigue.  HENT: Negative for ear discharge, ear pain, hearing loss, nosebleeds and sore throat.   Eyes: Negative for blurred vision and pain.  Respiratory: Negative for cough, hemoptysis, shortness of breath and wheezing.   Cardiovascular: Negative for chest pain, palpitations and leg swelling.  Gastrointestinal: Negative for nausea, vomiting, abdominal pain, diarrhea and blood in stool.  Genitourinary: Negative for dysuria.  Musculoskeletal: Negative for back pain.  Neurological: Negative for dizziness, tremors, speech change, focal weakness, seizures and headaches.  Endo/Heme/Allergies: Does not bruise/bleed easily.  Psychiatric/Behavioral: Negative for depression, suicidal ideas and hallucinations.    Tolerating Diet:yes      DRUG ALLERGIES:   Allergies  Allergen Reactions  . Spironolactone Other (See Comments)    Hyperkalemia  . Ziac [Bisoprolol-Hydrochlorothiazide]     VITALS:  Blood pressure 148/90, pulse 71, temperature 97.6 F (36.4 C), temperature source Oral, resp. rate 18, height 5\' 9"  (1.753 m), weight 84.641 kg (186 lb 9.6 oz), SpO2 93 %.  PHYSICAL EXAMINATION:   Physical Exam  Constitutional: He is oriented to person, place, and time and well-developed, well-nourished, and in no distress. No distress.  HENT:  Head: Normocephalic.  Eyes: No scleral icterus.  Neck: Normal range of motion. Neck supple. No JVD present. No tracheal deviation present.  Cardiovascular: Normal rate, regular rhythm and normal heart sounds.  Exam reveals no gallop and no friction rub.   No murmur heard. Pulmonary/Chest: Effort normal and breath sounds normal. No respiratory distress. He has no  wheezes. He has no rales. He exhibits no tenderness.  Abdominal: Soft. Bowel sounds are normal. He exhibits no distension and no mass. There is no tenderness. There is no rebound and no guarding.  Musculoskeletal: Normal range of motion. He exhibits no edema.  Neurological: He is alert and oriented to person, place, and time.  Skin: Skin is warm. No rash noted. No erythema.  Psychiatric: Affect and judgment normal.      LABORATORY PANEL:   CBC  Recent Labs Lab 02/25/16 0233  WBC 7.8  HGB 15.2  HCT 45.1  PLT 431   ------------------------------------------------------------------------------------------------------------------  Chemistries   Recent Labs Lab 02/24/16 2037 02/25/16 0233  NA 135 137  K 3.3* 3.3*  CL 100* 100*  CO2 25 28  GLUCOSE 116* 94  BUN 15 15  CREATININE 1.71* 1.67*  CALCIUM 8.8* 8.7*  AST 33  --   ALT 30  --   ALKPHOS 128*  --   BILITOT 0.9  --    ------------------------------------------------------------------------------------------------------------------  Cardiac Enzymes  Recent Labs Lab 02/24/16 2037 02/25/16 0233 02/25/16 0701  TROPONINI 0.06* 0.06* 0.11*   ------------------------------------------------------------------------------------------------------------------  RADIOLOGY:  Dg Chest Port 1 View  02/24/2016  CLINICAL DATA:  Patient has been feeling weak for a few days. Recently dx with bronchitis. Hx cad, cardiac arrest 2014, cabg 2006,2014, cardiac cath 2009, mitral valve repair, defibrillator EXAM: PORTABLE CHEST 1 VIEW COMPARISON:  11/04/2013 FINDINGS: There changes from previous cardiac surgery. Cardiac silhouette is mildly enlarged. No mediastinal or hilar masses.  No evidence of adenopathy. Lungs are mildly hyperexpanded. There are prominent bronchovascular markings in the lung bases. An area of discoid opacity is noted in the left mid to  lower lung, likely atelectasis. No convincing pneumonia. No overt pulmonary  edema. No pneumothorax. Left anterior chest wall single lead pacemaker is well positioned. IMPRESSION: 1. No convincing acute cardiopulmonary disease. Electronically Signed   By: Lajean Manes M.D.   On: 02/24/2016 21:21     ASSESSMENT AND PLAN:   65 year old male with a history of ischemic cardiomyopathy EF of less than 15% who presented with acute on chronic systolic heart failure.    1. Acute on chronic systolic heart failure: Patient has diuresed well with IV Lasix. He will need to continue metoprolol, Lasix and lisinopril.  2. PA F: Continue amiodarone and metoprolol for heart rate control. 3. Essential hypertension: Blood pressure better this morning. Continue metoprolol and lisinopril.  4.. ASCVD: Continue aspirin, statin, metoprolol, lisinopril. 5. Chronic kidney disease stage III: Creatinine at baseline.  6. Hyperlipidemia: Continue atorvastatin.   Management plans discussed with the patient and he is in agreement.  CODE STATUS: FULL  TOTAL TIME TAKING CARE OF THIS PATIENT: 30 minutes.     POSSIBLE D/C today??, DEPENDING ON CLINICAL CONDITION.   Celina Shiley M.D on 02/25/2016 at 11:11 AM  Between 7am to 6pm - Pager - [phone] After 6pm go to [email or web address] - password [email or web address] [facility]  Office  [phone]  CC: Primary care physician; Lelon Huh, MD  Note: This dictation was prepared with Dragon dictation along with smaller phrase technology. Any transcriptional errors that result from this process are unintentional.

## 2016-03-05 ENCOUNTER — Encounter: Payer: Self-pay | Admitting: Family Medicine

## 2016-03-05 ENCOUNTER — Ambulatory Visit (INDEPENDENT_AMBULATORY_CARE_PROVIDER_SITE_OTHER): Payer: Commercial Managed Care - HMO | Admitting: Family Medicine

## 2016-03-05 VITALS — BP 118/70 | HR 66 | Temp 97.5°F | Resp 16 | Ht 68.5 in | Wt 191.0 lb

## 2016-03-05 DIAGNOSIS — I5023 Acute on chronic systolic (congestive) heart failure: Secondary | ICD-10-CM | POA: Diagnosis not present

## 2016-03-05 DIAGNOSIS — I110 Hypertensive heart disease with heart failure: Secondary | ICD-10-CM | POA: Diagnosis not present

## 2016-03-05 DIAGNOSIS — I429 Cardiomyopathy, unspecified: Secondary | ICD-10-CM

## 2016-03-05 DIAGNOSIS — I43 Cardiomyopathy in diseases classified elsewhere: Secondary | ICD-10-CM

## 2016-03-05 MED ORDER — LISINOPRIL 20 MG PO TABS: 20.0000 mg | ORAL_TABLET | Freq: Every day | ORAL | Status: DC

## 2016-03-05 NOTE — Progress Notes (Signed)
Patient: Chris Nguyen Male    DOB: 24-Nov-1951   65 y.o.   MRN: [id number] Visit Date: 03/05/2016  Today's Provider: Lelon Huh, MD   Chief Complaint  Patient presents with  . Hospitalization Follow-up   Subjective:    HPI   Follow up Hospitalization  Patient was admitted to [facility] on 02/24/2016 and discharged on 02/25/2016. He was treated for Acute on chronic congestive heart failure, unspecified congestive heart failu Treatment for this included: Patient was diuresed with   IV Lasix. He reports good compliance with treatment. He had EF=20-25%. He had appointment scheduled with Dr. Clayborn Bigness on 4-3, but was rescheduled for May. He has follow up scheduled at CHF clinic on 03-12-16.  He reports this condition is Improved. He feels back to his baseline.   ----------------------------------------------------------------------    Allergies  Allergen Reactions  . Spironolactone Other (See Comments)    Hyperkalemia  . Ziac [Bisoprolol-Hydrochlorothiazide]    Previous Medications   ALBUTEROL (PROVENTIL HFA;VENTOLIN HFA) 108 (90 BASE) MCG/ACT INHALER    Inhale 2 puffs into the lungs every 6 (six) hours as needed for wheezing or shortness of breath.   AMIODARONE (PACERONE) 200 MG TABLET    TAKE 1 TABLET DAILY FOR ATRIAL FIBRILLATION   ASPIRIN 81 MG CHEWABLE TABLET    Chew by mouth.   ATORVASTATIN (LIPITOR) 80 MG TABLET    TAKE 1 TABLET EVERY DAY   FOLIC ACID (FOLVITE) 1 MG TABLET    TAKE ONE TABLET BY MOUTH ONCE DAILY   FUROSEMIDE (LASIX) 40 MG TABLET    Take 1 tablet by mouth daily.   HYDROCODONE-HOMATROPINE (HYCODAN) 5-1.5 MG/5ML SYRUP    Take 5 mLs by mouth every 8 (eight) hours as needed for cough.   LISINOPRIL (PRINIVIL,ZESTRIL) 20 MG TABLET    Take 1 tablet by mouth daily.   METOPROLOL SUCCINATE (TOPROL-XL) 25 MG 24 HR TABLET    TAKE 1 TABLET EVERY DAY   SILDENAFIL (REVATIO) 20 MG TABLET    Take 5 tablets by mouth 30 minutes prior to intercourse.   TADALAFIL (CIALIS) 5 MG  TABLET    Take 5 mg by mouth daily as needed for erectile dysfunction.   TRAMADOL HCL 50 MG TBDP    Take 50 mg by mouth 2 (two) times daily as needed.    Review of Systems  Constitutional: Negative for fever, chills and appetite change.  Respiratory: Negative for chest tightness, shortness of breath and wheezing.   Cardiovascular: Negative for chest pain and palpitations.  Gastrointestinal: Negative for nausea, vomiting and abdominal pain.    Social History  Substance Use Topics  . Smoking status: Former Smoker    Quit date: 12/01/2004  . Smokeless tobacco: Not on file  . Alcohol Use: 4.2 - 5.4 oz/week    1 Glasses of wine, 6-8 Cans of beer per week   Objective:   Pulse 66  Temp(Src) 97.5 F (36.4 C) (Oral)  Resp 16  Ht 5' 8.5" (1.74 m)  Wt 191 lb (86.637 kg)  BMI 28.62 kg/m2  SpO2 98%  Physical Exam   General Appearance:    Alert, cooperative, no distress  Eyes:    PERRL, conjunctiva/corneas clear, EOM's intact       Lungs:     Clear to auscultation bilaterally, respirations unlabored  Heart:    Regular rate and rhythm  Neurologic:   Awake, alert, oriented x 3. No apparent focal neurological  defect.          Assessment & Plan:     1. Acute on chronic systolic CHF (congestive heart failure) ([place]) Greatly improve since recent hospitalization. Continue current medications. Follow up CHF clinic as scheduled 4-12 and Dr. Clayborn Bigness in May - lisinopril (PRINIVIL,ZESTRIL) 20 MG tablet; Take 1 tablet (20 mg total) by mouth daily.  Dispense: 90 tablet; Refill: 4  2. Cardiomyopathy ([place])  - lisinopril (PRINIVIL,ZESTRIL) 20 MG tablet; Take 1 tablet (20 mg total) by mouth daily.  Dispense: 90 tablet; Refill: 4  3. Hypertensive cardiomyopathy, with heart failure (HCC)  - lisinopril (PRINIVIL,ZESTRIL) 20 MG tablet; Take 1 tablet (20 mg total) by mouth daily.  Dispense: 90 tablet; Refill: 4  Follow up her in 3 months.         Lelon Huh, MD  [facility]

## 2016-03-06 DIAGNOSIS — H5210 Myopia, unspecified eye: Secondary | ICD-10-CM | POA: Diagnosis not present

## 2016-03-06 DIAGNOSIS — E7800 Pure hypercholesterolemia, unspecified: Secondary | ICD-10-CM | POA: Diagnosis not present

## 2016-03-06 DIAGNOSIS — I10 Essential (primary) hypertension: Secondary | ICD-10-CM | POA: Diagnosis not present

## 2016-03-12 ENCOUNTER — Ambulatory Visit: Payer: Self-pay | Admitting: Family

## 2016-06-04 ENCOUNTER — Ambulatory Visit: Payer: Self-pay | Admitting: Family Medicine

## 2016-08-08 ENCOUNTER — Other Ambulatory Visit: Payer: Self-pay | Admitting: Family Medicine

## 2016-12-17 ENCOUNTER — Ambulatory Visit: Payer: Self-pay | Admitting: Family Medicine

## 2016-12-19 ENCOUNTER — Ambulatory Visit
Admission: RE | Admit: 2016-12-19 | Discharge: 2016-12-19 | Disposition: A | Discharge status: Home / Self Care | Payer: Commercial Managed Care - HMO | Source: Ambulatory Visit | Attending: Family Medicine | Admitting: Family Medicine

## 2016-12-19 ENCOUNTER — Ambulatory Visit (INDEPENDENT_AMBULATORY_CARE_PROVIDER_SITE_OTHER): Payer: Medicare HMO | Admitting: Family Medicine

## 2016-12-19 VITALS — BP 130/94 | HR 82 | Temp 97.7°F | Resp 18 | Wt 189.0 lb

## 2016-12-19 DIAGNOSIS — R05 Cough: Secondary | ICD-10-CM

## 2016-12-19 DIAGNOSIS — R059 Cough, unspecified: Secondary | ICD-10-CM

## 2016-12-19 DIAGNOSIS — I517 Cardiomegaly: Secondary | ICD-10-CM | POA: Diagnosis not present

## 2016-12-19 DIAGNOSIS — R0602 Shortness of breath: Secondary | ICD-10-CM | POA: Diagnosis not present

## 2016-12-19 DIAGNOSIS — R0609 Other forms of dyspnea: Secondary | ICD-10-CM

## 2016-12-19 NOTE — Patient Instructions (Signed)
Go to the [facility] on [address] for chest Xray  

## 2016-12-19 NOTE — Progress Notes (Signed)
Patient: Chris Nguyen Male    DOB: 1951/05/25   66 y.o.   MRN: [id number] Visit Date: 12/19/2016  Today's Provider: Lelon Huh, MD   Chief Complaint  Patient presents with  . Shortness of Breath  . Fatigue   Subjective:    Patient has been having shortness of breath for over a month. Patient has sob when he's walking and when he lays down flat. Patient stated that he has been having a hard time catching his breath when he walks. Patient has tightness in his chest during these episodes with wheezing. Patient uses his albuterol inhalers with very mild relief. Patient stated that he is not sleeping and resting. He is always feeling fatigued. Symptoms are slowing worsening.     Shortness of Breath  This is a new problem. The current episode started more than 1 month ago (1 month ago). The problem occurs constantly. The problem has been gradually worsening. Associated symptoms include PND, a rash and wheezing. Pertinent negatives include no abdominal pain, chest pain, claudication, coryza, ear pain, fever, headaches, hemoptysis, leg pain, leg swelling, neck pain, orthopnea, rhinorrhea, sore throat, sputum production, swollen glands, syncope or vomiting. The symptoms are aggravated by any activity and lying flat. He has tried ipratropium inhalers and steroid inhalers for the symptoms. The treatment provided mild relief. His past medical history is significant for bronchiolitis and CAD. There is no history of allergies, aspirin allergies, asthma, chronic lung disease, COPD, DVT, a heart failure, PE, pneumonia or a recent surgery.   States albuterol inhaler doesn't help.   Has history of atrial fibrillation and stopped taking amiodarone a few weeks ago. Has tried double dose of furosemide which hasn't seemed to help.   Also states taking atorvastatin about 2 weeks ago.    Wt Readings from Last 3 Encounters:  12/19/16 189 lb (85.7 kg)  03/05/16 191 lb (86.6 kg)  02/25/16 186 lb 9.6  oz (84.6 kg)       Allergies  Allergen Reactions  . Spironolactone Other (See Comments)    Hyperkalemia  . Ziac [Bisoprolol-Hydrochlorothiazide]      Current Outpatient Prescriptions:  .  albuterol (PROVENTIL HFA;VENTOLIN HFA) 108 (90 Base) MCG/ACT inhaler, Inhale 2 puffs into the lungs every 6 (six) hours as needed for wheezing or shortness of breath., Disp: 1 Inhaler, Rfl: 0 .  amiodarone (PACERONE) 200 MG tablet, TAKE 1 TABLET DAILY FOR ATRIAL FIBRILLATION, Disp: 90 tablet, Rfl: 3 .  aspirin 81 MG chewable tablet, Chew by mouth., Disp: , Rfl:  .  atorvastatin (LIPITOR) 80 MG tablet, TAKE 1 TABLET EVERY DAY, Disp: 90 tablet, Rfl: 3 .  folic acid (FOLVITE) 1 MG tablet, TAKE ONE TABLET BY MOUTH ONCE DAILY, Disp: 30 tablet, Rfl: 12 .  furosemide (LASIX) 40 MG tablet, Take 1 tablet by mouth daily., Disp: , Rfl:  .  lisinopril (PRINIVIL,ZESTRIL) 20 MG tablet, Take 1 tablet (20 mg total) by mouth daily., Disp: 90 tablet, Rfl: 4 .  metoprolol succinate (TOPROL-XL) 25 MG 24 hr tablet, TAKE 1 TABLET EVERY DAY, Disp: 90 tablet, Rfl: 3 .  sildenafil (REVATIO) 20 MG tablet, Take 5 tablets by mouth 30 minutes prior to intercourse., Disp: , Rfl:  .  tadalafil (CIALIS) 5 MG tablet, Take 5 mg by mouth daily as needed for erectile dysfunction., Disp: , Rfl:  .  TraMADol HCl 50 MG TBDP, Take 50 mg by mouth 2 (two) times daily as needed., Disp: , Rfl:   Review  of Systems  Constitutional: Positive for fatigue. Negative for appetite change, chills and fever.  HENT: Negative for ear pain, rhinorrhea and sore throat.   Respiratory: Positive for chest tightness and wheezing. Negative for hemoptysis, sputum production and shortness of breath.   Cardiovascular: Positive for PND. Negative for chest pain, palpitations, orthopnea, claudication, leg swelling and syncope.  Gastrointestinal: Negative for abdominal pain, nausea and vomiting.  Musculoskeletal: Negative for neck pain.  Skin: Positive for rash.    Neurological: Negative for headaches.  Dictation #1 [id number]   Social History  Substance Use Topics  . Smoking status: Former Smoker    Quit date: 12/01/2004  . Smokeless tobacco: Not on file  . Alcohol use 4.2 - 5.4 oz/week    1 Glasses of wine, 6 - 8 Cans of beer per week   Objective:   BP (!) 130/94 (BP Location: Right Arm, Patient Position: Sitting, Cuff Size: Large)   Pulse 82   Temp 97.7 F (36.5 C) (Oral)   Resp 18   Wt 189 lb (85.7 kg)   SpO2 96%   BMI 28.32 kg/m   Physical Exam   General Appearance:    Alert, cooperative, no distress  Eyes:    PERRL, conjunctiva/corneas clear, EOM's intact       Lungs:     Clear to auscultation bilaterally, respirations unlabored  Heart:    Regular rate and rhythm  Neurologic:   Awake, alert, oriented x 3. No apparent focal neurological           defect.       [id number]  Rhythm - occasional PAC   # PACs = 1. Voltage criteria for LVH (S(V2) exceeds 3.00 mV).  -Nonspecific ST depression  +  Nonspecific T-abnormality -Seen with left ventricular hypertrophy (strain) or digitalis effect.  Rightward axis and rotation -possible pulmonary disease.     Assessment & Plan:     1. Dyspnea on exertion  - EKG 12-Lead - CBC - Comprehensive metabolic panel - Brain natriuretic peptide  2. Cough  - EKG 12-Lead - DG Chest 2 View; Future       Lelon Huh, MD  [facility]

## 2016-12-20 LAB — COMPREHENSIVE METABOLIC PANEL
A/G RATIO: 1.4 (ref 1.2–2.2)
ALBUMIN: 4.6 g/dL (ref 3.6–4.8)
ALK PHOS: 119 IU/L — AB (ref 39–117)
ALT: 10 IU/L (ref 0–44)
AST: 17 IU/L (ref 0–40)
BILIRUBIN TOTAL: 0.6 mg/dL (ref 0.0–1.2)
BUN / CREAT RATIO: 8 — AB (ref 10–24)
BUN: 16 mg/dL (ref 8–27)
CHLORIDE: 98 mmol/L (ref 96–106)
CO2: 27 mmol/L (ref 18–29)
Calcium: 9.9 mg/dL (ref 8.6–10.2)
Creatinine, Ser: 2.03 mg/dL — ABNORMAL HIGH (ref 0.76–1.27)
GFR calc Af Amer: 38 mL/min/{1.73_m2} — ABNORMAL LOW (ref >59)
GFR calc non Af Amer: 33 mL/min/{1.73_m2} — ABNORMAL LOW (ref >59)
GLOBULIN, TOTAL: 3.2 g/dL (ref 1.5–4.5)
Glucose: 74 mg/dL (ref 65–99)
POTASSIUM: 4.3 mmol/L (ref 3.5–5.2)
SODIUM: 144 mmol/L (ref 134–144)
Total Protein: 7.8 g/dL (ref 6.0–8.5)

## 2016-12-20 LAB — CBC
HEMATOCRIT: 55.2 % — AB (ref 37.5–51.0)
HEMOGLOBIN: 18.7 g/dL — AB (ref 13.0–17.7)
MCH: 30.7 pg (ref 26.6–33.0)
MCHC: 33.9 g/dL (ref 31.5–35.7)
MCV: 91 fL (ref 79–97)
Platelets: 253 10*3/uL (ref 150–379)
RBC: 6.1 x10E6/uL — ABNORMAL HIGH (ref 4.14–5.80)
RDW: 14.8 % (ref 12.3–15.4)
WBC: 6.2 10*3/uL (ref 3.4–10.8)

## 2016-12-20 LAB — BRAIN NATRIURETIC PEPTIDE: BNP: 697.8 pg/mL — AB (ref 0.0–100.0)

## 2016-12-22 ENCOUNTER — Telehealth: Payer: Self-pay

## 2016-12-22 ENCOUNTER — Other Ambulatory Visit: Payer: Self-pay | Admitting: Family Medicine

## 2016-12-22 DIAGNOSIS — D751 Secondary polycythemia: Secondary | ICD-10-CM

## 2016-12-22 DIAGNOSIS — I5021 Acute systolic (congestive) heart failure: Secondary | ICD-10-CM

## 2016-12-22 DIAGNOSIS — I429 Cardiomyopathy, unspecified: Secondary | ICD-10-CM

## 2016-12-22 MED ORDER — METOPROLOL SUCCINATE ER 50 MG PO TB24: 50.0000 mg | ORAL_TABLET | Freq: Every day | ORAL | 3 refills | Status: DC

## 2016-12-22 NOTE — Telephone Encounter (Signed)
-----   Message from Birdie Sons, MD sent at 12/22/2016  8:59 AM EST ----- Elevated BNP indicates strain on heart. Need to increase metoprolol ER to Two x 25mg  tablets a day. Need to follow up with his cardiologist Dr. Clayborn Bigness within the next week. Will send referral to sarah to set up.  Also his elevated red blood cell count. I'm not sure if this has anything to due with shortness of breath, but needs referral to hematology for further evaluation.

## 2016-12-22 NOTE — Progress Notes (Unsigned)
Please refer cardiology, Dr. Clayborn Bigness for follow up worsening CHF Please refer hematology for polycythemia.

## 2016-12-22 NOTE — Telephone Encounter (Signed)
Advised pt of lab results. Pt verbally acknowledges understanding. New Metoprolol dose sent to pharmacy. Renaldo Fiddler, CMA

## 2016-12-23 DIAGNOSIS — J3489 Other specified disorders of nose and nasal sinuses: Secondary | ICD-10-CM | POA: Diagnosis not present

## 2016-12-23 DIAGNOSIS — I480 Paroxysmal atrial fibrillation: Secondary | ICD-10-CM | POA: Diagnosis not present

## 2016-12-23 DIAGNOSIS — I10 Essential (primary) hypertension: Secondary | ICD-10-CM | POA: Diagnosis not present

## 2016-12-23 DIAGNOSIS — K219 Gastro-esophageal reflux disease without esophagitis: Secondary | ICD-10-CM | POA: Diagnosis not present

## 2016-12-23 DIAGNOSIS — I255 Ischemic cardiomyopathy: Secondary | ICD-10-CM | POA: Diagnosis not present

## 2016-12-23 DIAGNOSIS — Z95810 Presence of automatic (implantable) cardiac defibrillator: Secondary | ICD-10-CM | POA: Diagnosis not present

## 2016-12-23 DIAGNOSIS — R0602 Shortness of breath: Secondary | ICD-10-CM | POA: Diagnosis not present

## 2016-12-23 DIAGNOSIS — N183 Chronic kidney disease, stage 3 (moderate): Secondary | ICD-10-CM | POA: Diagnosis not present

## 2016-12-23 DIAGNOSIS — G629 Polyneuropathy, unspecified: Secondary | ICD-10-CM | POA: Diagnosis not present

## 2017-01-08 ENCOUNTER — Ambulatory Visit: Payer: Self-pay | Admitting: Oncology

## 2017-01-08 DIAGNOSIS — H6980 Other specified disorders of Eustachian tube, unspecified ear: Secondary | ICD-10-CM | POA: Diagnosis not present

## 2017-01-08 DIAGNOSIS — J342 Deviated nasal septum: Secondary | ICD-10-CM | POA: Diagnosis not present

## 2017-01-08 DIAGNOSIS — J301 Allergic rhinitis due to pollen: Secondary | ICD-10-CM | POA: Diagnosis not present

## 2017-01-16 ENCOUNTER — Encounter: Payer: Self-pay | Admitting: Emergency Medicine

## 2017-01-16 ENCOUNTER — Inpatient Hospital Stay
Admission: EM | Admit: 2017-01-16 | Discharge: 2017-01-19 | DRG: 291 | Disposition: A | Discharge status: Home / Self Care | Payer: Medicare HMO | Attending: Internal Medicine | Admitting: Internal Medicine

## 2017-01-16 ENCOUNTER — Telehealth: Payer: Self-pay

## 2017-01-16 ENCOUNTER — Emergency Department: Payer: Medicare HMO

## 2017-01-16 DIAGNOSIS — Z87891 Personal history of nicotine dependence: Secondary | ICD-10-CM | POA: Diagnosis not present

## 2017-01-16 DIAGNOSIS — I5020 Unspecified systolic (congestive) heart failure: Secondary | ICD-10-CM | POA: Diagnosis not present

## 2017-01-16 DIAGNOSIS — K219 Gastro-esophageal reflux disease without esophagitis: Secondary | ICD-10-CM | POA: Diagnosis present

## 2017-01-16 DIAGNOSIS — R0602 Shortness of breath: Secondary | ICD-10-CM | POA: Diagnosis not present

## 2017-01-16 DIAGNOSIS — I509 Heart failure, unspecified: Secondary | ICD-10-CM

## 2017-01-16 DIAGNOSIS — Z952 Presence of prosthetic heart valve: Secondary | ICD-10-CM | POA: Diagnosis not present

## 2017-01-16 DIAGNOSIS — R531 Weakness: Secondary | ICD-10-CM | POA: Diagnosis present

## 2017-01-16 DIAGNOSIS — I10 Essential (primary) hypertension: Secondary | ICD-10-CM | POA: Diagnosis not present

## 2017-01-16 DIAGNOSIS — Z951 Presence of aortocoronary bypass graft: Secondary | ICD-10-CM | POA: Diagnosis not present

## 2017-01-16 DIAGNOSIS — E785 Hyperlipidemia, unspecified: Secondary | ICD-10-CM | POA: Diagnosis not present

## 2017-01-16 DIAGNOSIS — E876 Hypokalemia: Secondary | ICD-10-CM | POA: Diagnosis not present

## 2017-01-16 DIAGNOSIS — I110 Hypertensive heart disease with heart failure: Secondary | ICD-10-CM | POA: Diagnosis not present

## 2017-01-16 DIAGNOSIS — Z95810 Presence of automatic (implantable) cardiac defibrillator: Secondary | ICD-10-CM

## 2017-01-16 DIAGNOSIS — I5023 Acute on chronic systolic (congestive) heart failure: Secondary | ICD-10-CM | POA: Diagnosis present

## 2017-01-16 DIAGNOSIS — J329 Chronic sinusitis, unspecified: Secondary | ICD-10-CM | POA: Diagnosis present

## 2017-01-16 DIAGNOSIS — I255 Ischemic cardiomyopathy: Secondary | ICD-10-CM | POA: Diagnosis not present

## 2017-01-16 DIAGNOSIS — I480 Paroxysmal atrial fibrillation: Secondary | ICD-10-CM | POA: Diagnosis present

## 2017-01-16 DIAGNOSIS — I248 Other forms of acute ischemic heart disease: Secondary | ICD-10-CM | POA: Diagnosis not present

## 2017-01-16 DIAGNOSIS — I2510 Atherosclerotic heart disease of native coronary artery without angina pectoris: Secondary | ICD-10-CM | POA: Diagnosis not present

## 2017-01-16 DIAGNOSIS — R0902 Hypoxemia: Secondary | ICD-10-CM | POA: Diagnosis not present

## 2017-01-16 DIAGNOSIS — Z888 Allergy status to other drugs, medicaments and biological substances status: Secondary | ICD-10-CM | POA: Diagnosis not present

## 2017-01-16 DIAGNOSIS — Z8674 Personal history of sudden cardiac arrest: Secondary | ICD-10-CM

## 2017-01-16 DIAGNOSIS — Z7982 Long term (current) use of aspirin: Secondary | ICD-10-CM

## 2017-01-16 DIAGNOSIS — N400 Enlarged prostate without lower urinary tract symptoms: Secondary | ICD-10-CM | POA: Diagnosis present

## 2017-01-16 DIAGNOSIS — Z8249 Family history of ischemic heart disease and other diseases of the circulatory system: Secondary | ICD-10-CM | POA: Diagnosis not present

## 2017-01-16 DIAGNOSIS — Z7951 Long term (current) use of inhaled steroids: Secondary | ICD-10-CM | POA: Diagnosis not present

## 2017-01-16 DIAGNOSIS — N183 Chronic kidney disease, stage 3 (moderate): Secondary | ICD-10-CM | POA: Diagnosis not present

## 2017-01-16 DIAGNOSIS — I130 Hypertensive heart and chronic kidney disease with heart failure and stage 1 through stage 4 chronic kidney disease, or unspecified chronic kidney disease: Secondary | ICD-10-CM | POA: Diagnosis not present

## 2017-01-16 DIAGNOSIS — Z79899 Other long term (current) drug therapy: Secondary | ICD-10-CM | POA: Diagnosis not present

## 2017-01-16 DIAGNOSIS — N179 Acute kidney failure, unspecified: Secondary | ICD-10-CM | POA: Diagnosis not present

## 2017-01-16 DIAGNOSIS — I5031 Acute diastolic (congestive) heart failure: Secondary | ICD-10-CM | POA: Diagnosis not present

## 2017-01-16 DIAGNOSIS — I25119 Atherosclerotic heart disease of native coronary artery with unspecified angina pectoris: Secondary | ICD-10-CM | POA: Diagnosis present

## 2017-01-16 HISTORY — DX: Presence of automatic (implantable) cardiac defibrillator: Z95.810

## 2017-01-16 LAB — BASIC METABOLIC PANEL
Anion gap: 11 (ref 5–15)
BUN: 21 mg/dL — ABNORMAL HIGH (ref 6–20)
CHLORIDE: 101 mmol/L (ref 101–111)
CO2: 26 mmol/L (ref 22–32)
Calcium: 9.3 mg/dL (ref 8.9–10.3)
Creatinine, Ser: 1.71 mg/dL — ABNORMAL HIGH (ref 0.61–1.24)
GFR calc Af Amer: 46 mL/min — ABNORMAL LOW (ref >60)
GFR calc non Af Amer: 40 mL/min — ABNORMAL LOW (ref >60)
Glucose, Bld: 93 mg/dL (ref 65–99)
POTASSIUM: 3.3 mmol/L — AB (ref 3.5–5.1)
SODIUM: 138 mmol/L (ref 135–145)

## 2017-01-16 LAB — CBC
HEMATOCRIT: 50.6 % (ref 40.0–52.0)
HEMOGLOBIN: 17.4 g/dL (ref 13.0–18.0)
MCH: 30.5 pg (ref 26.0–34.0)
MCHC: 34.4 g/dL (ref 32.0–36.0)
MCV: 88.5 fL (ref 80.0–100.0)
Platelets: 218 10*3/uL (ref 150–440)
RBC: 5.71 MIL/uL (ref 4.40–5.90)
RDW: 14.5 % (ref 11.5–14.5)
WBC: 6.5 10*3/uL (ref 3.8–10.6)

## 2017-01-16 LAB — TROPONIN I
TROPONIN I: 0.06 ng/mL — AB (ref <0.03)
Troponin I: 0.05 ng/mL (ref <0.03)
Troponin I: 0.06 ng/mL (ref <0.03)

## 2017-01-16 LAB — BRAIN NATRIURETIC PEPTIDE: B Natriuretic Peptide: 1101 pg/mL — ABNORMAL HIGH (ref 0.0–100.0)

## 2017-01-16 MED ORDER — AZELASTINE HCL 0.1 % NA SOLN
2.0000 | Freq: Two times a day (BID) | NASAL | Status: DC
  Filled 2017-01-16: qty 30

## 2017-01-16 MED ORDER — SODIUM CHLORIDE 0.9% FLUSH
3.0000 mL | Freq: Two times a day (BID) | INTRAVENOUS | Status: DC
  Administered 2017-01-16 – 2017-01-18 (×5): 3 mL via INTRAVENOUS

## 2017-01-16 MED ORDER — AMIODARONE HCL 200 MG PO TABS
200.0000 mg | ORAL_TABLET | Freq: Every day | ORAL | Status: DC
  Administered 2017-01-16 – 2017-01-19 (×4): 200 mg via ORAL
  Filled 2017-01-16 (×4): qty 1

## 2017-01-16 MED ORDER — ADULT MULTIVITAMIN W/MINERALS CH
1.0000 | ORAL_TABLET | Freq: Every day | ORAL | Status: DC
  Administered 2017-01-16 – 2017-01-19 (×4): 1 via ORAL
  Filled 2017-01-16 (×4): qty 1

## 2017-01-16 MED ORDER — METOPROLOL SUCCINATE ER 50 MG PO TB24
50.0000 mg | ORAL_TABLET | Freq: Every day | ORAL | Status: DC
  Administered 2017-01-16 – 2017-01-19 (×4): 50 mg via ORAL
  Filled 2017-01-16 (×4): qty 1

## 2017-01-16 MED ORDER — ATORVASTATIN CALCIUM 20 MG PO TABS
80.0000 mg | ORAL_TABLET | Freq: Every day | ORAL | Status: DC
  Administered 2017-01-16 – 2017-01-19 (×4): 80 mg via ORAL
  Filled 2017-01-16 (×4): qty 4

## 2017-01-16 MED ORDER — FOLIC ACID 1 MG PO TABS
1.0000 mg | ORAL_TABLET | Freq: Every day | ORAL | Status: DC
  Administered 2017-01-16 – 2017-01-19 (×4): 1 mg via ORAL
  Filled 2017-01-16 (×4): qty 1

## 2017-01-16 MED ORDER — FUROSEMIDE 10 MG/ML IJ SOLN
20.0000 mg | Freq: Three times a day (TID) | INTRAMUSCULAR | Status: DC
  Administered 2017-01-16 – 2017-01-18 (×6): 20 mg via INTRAVENOUS
  Filled 2017-01-16 (×6): qty 2

## 2017-01-16 MED ORDER — FUROSEMIDE 10 MG/ML IJ SOLN
40.0000 mg | Freq: Once | INTRAMUSCULAR | Status: AC
  Administered 2017-01-16: 40 mg via INTRAVENOUS
  Filled 2017-01-16: qty 4

## 2017-01-16 MED ORDER — LISINOPRIL 20 MG PO TABS
20.0000 mg | ORAL_TABLET | Freq: Every day | ORAL | Status: DC
  Administered 2017-01-16 – 2017-01-17 (×2): 20 mg via ORAL
  Filled 2017-01-16 (×2): qty 1

## 2017-01-16 MED ORDER — HEPARIN SODIUM (PORCINE) 5000 UNIT/ML IJ SOLN
5000.0000 [IU] | Freq: Three times a day (TID) | INTRAMUSCULAR | Status: DC
  Administered 2017-01-16 – 2017-01-19 (×8): 5000 [IU] via SUBCUTANEOUS
  Filled 2017-01-16 (×7): qty 1

## 2017-01-16 MED ORDER — ASPIRIN 81 MG PO CHEW
81.0000 mg | CHEWABLE_TABLET | Freq: Every day | ORAL | Status: DC
  Administered 2017-01-16 – 2017-01-19 (×4): 81 mg via ORAL
  Filled 2017-01-16 (×4): qty 1

## 2017-01-16 MED ORDER — FLUTICASONE PROPIONATE 50 MCG/ACT NA SUSP
2.0000 | Freq: Every day | NASAL | Status: DC
  Filled 2017-01-16: qty 16

## 2017-01-16 MED ORDER — ALBUTEROL SULFATE (2.5 MG/3ML) 0.083% IN NEBU: 3.0000 mL | INHALATION_SOLUTION | Freq: Four times a day (QID) | RESPIRATORY_TRACT | Status: DC | PRN

## 2017-01-16 MED ORDER — TADALAFIL 5 MG PO TABS: 5.0000 mg | ORAL_TABLET | Freq: Every day | ORAL | Status: DC | PRN

## 2017-01-16 MED ORDER — DOCUSATE SODIUM 100 MG PO CAPS: 100.0000 mg | ORAL_CAPSULE | Freq: Two times a day (BID) | ORAL | Status: DC | PRN

## 2017-01-16 MED ORDER — SALINE SPRAY 0.65 % NA SOLN
1.0000 | NASAL | Status: DC | PRN
  Filled 2017-01-16: qty 44

## 2017-01-16 MED ORDER — LORATADINE 10 MG PO TABS
10.0000 mg | ORAL_TABLET | Freq: Every day | ORAL | Status: DC
  Administered 2017-01-16 – 2017-01-19 (×4): 10 mg via ORAL
  Filled 2017-01-16 (×4): qty 1

## 2017-01-16 NOTE — ED Notes (Signed)
Called Katie-charge RN and notified of troponin of .05.

## 2017-01-16 NOTE — Telephone Encounter (Signed)
Patient called office with concerns of shortness of breath, patient reports that he has had shortness of breath for the past two months and has followed here in the office for problem. Patient reports that we referred him to Dr. Clayborn Bigness and than Dr. Clayborn Bigness referred him to ENT but patient was unable to make appt. Patients breathing was labored on phone, he denied dizziness/lightheadiness, chest pain, visual disturbance, nausea/vomiting or fever. Patient report that he does have a cough and has been wheezing since last night. I urged the patient to seek immediate medical attention since his breathing was so labored, I asked patient if he needed for me to call EMS he declined and said his wife was on the way to the house to take him to ER. KW

## 2017-01-16 NOTE — ED Notes (Signed)
REDS VEST READING= 41 CHEST RULER=8.5  VEST FITTING TASKS: POSTURE=sitting HEIGHT MARKER=s CENTER STRIP=right  COMMENTS:approved by [employer]

## 2017-01-16 NOTE — ED Triage Notes (Signed)
Pt presents with shortness of breath for two mths. Denies any pain.

## 2017-01-16 NOTE — ED Provider Notes (Signed)
[facility] Emergency Department Provider Note  ____________________________________________   First MD Initiated Contact with Patient 01/16/17 1119     (approximate)  I have reviewed the triage vital signs and the nursing notes.   HISTORY  Chief Complaint Shortness of Breath   HPI Chris Nguyen is a 66 y.o. male with a history of CAD and heart failure on Lasix was presenting to the emergency department today with shortness of breath that has been worsening over the past 2 months. He is denying any pain. Says that he finally developed a cough yesterday and this is why came into the emergency department. He has been seen by his primary care doctor as well as her nose and throat with a hematocrit of been an ENT issue. However, the patient was found to be hypoxic to 81% here. Placed on nasal cannula oxygen.   Past Medical History:  Diagnosis Date  . BPH (benign prostatic hyperplasia)   . CAD (coronary artery disease) 05/14/2006  . Cardiac arrest ([place]) 11/12/2013   Overview:  December 2014: ICD implanted  Last Assessment & Plan:  Relevant Hx: Course: Daily Update: Today's Plan:   . Chronic systolic CHF (congestive heart failure) ([place])   . Erectile dysfunction   . H/O coronary artery bypass surgery 05/17/2015   Overview:  LIMA to LAD   . HLD (hyperlipidemia)   . Osteoarthritis of left wrist   . Paroxysmal atrial fibrillation [facility])     Patient Active Problem List   Diagnosis Date Noted  . HLD (hyperlipidemia) 02/25/2016  . Acute on chronic systolic CHF (congestive heart failure) ([place]) 02/24/2016  . Chronic kidney disease (CKD), stage III (moderate) 05/17/2015  . Acid reflux 05/17/2015  . Heart failure, systolic ([place]) [id number]  . Hyperlipidemia   . Hypertension   . Hypertensive cardiomyopathy ([place])   . Cardiomyopathy ([place])   . Foot pain, left   . BPH (benign prostatic hyperplasia)   . Erectile dysfunction   . Left leg weakness   . Encounter for  adjustment or management of automatic implantable cardioverter-defibrillator 02/07/2014  . Paroxysmal a-fib ([place]) 12/26/2013  . Automatic implantable cardioverter-defibrillator in situ 12/15/2013  . Osteoarthritis of left wrist 09/27/2010  . CAD (coronary artery disease) 05/14/2006    Past Surgical History:  Procedure Laterality Date  . CARDIAC CATHETERIZATION  06/27/2008  . CARPAL TUNNEL RELEASE    . CORONARY ARTERY BYPASS GRAFT  2006 and 2014   LIMA to LAD at [facility] 2014  . MITRAL VALVE REPAIR      Prior to Admission medications   Medication Sig Start Date End Date Taking? Authorizing Provider  albuterol (PROVENTIL HFA;VENTOLIN HFA) 108 (90 Base) MCG/ACT inhaler Inhale 2 puffs into the lungs every 6 (six) hours as needed for wheezing or shortness of breath. 02/08/16  Yes Birdie Sons, MD  amiodarone (PACERONE) 200 MG tablet TAKE 1 TABLET DAILY FOR ATRIAL FIBRILLATION 12/11/15  Yes Birdie Sons, MD  aspirin 81 MG chewable tablet Chew by mouth.   Yes Historical Provider, MD  azelastine (ASTELIN) 0.1 % nasal spray Place 2 sprays into both nostrils 2 (two) times daily. Use in each nostril as directed   Yes Historical Provider, MD  fluticasone (FLONASE) 50 MCG/ACT nasal spray Place 2 sprays into both nostrils daily.   Yes Historical Provider, MD  folic acid (FOLVITE) 1 MG tablet TAKE ONE TABLET BY MOUTH ONCE DAILY 08/08/16  Yes Birdie Sons, MD  furosemide (LASIX) 40 MG tablet Take 1 tablet  by mouth daily. 08/30/15 01/16/17 Yes Historical Provider, MD  lisinopril (PRINIVIL,ZESTRIL) 20 MG tablet Take 1 tablet (20 mg total) by mouth daily. 03/05/16  Yes Birdie Sons, MD  loratadine (CLARITIN) 10 MG tablet Take 10 mg by mouth daily.   Yes Historical Provider, MD  metoprolol succinate (TOPROL-XL) 50 MG 24 hr tablet Take 1 tablet (50 mg total) by mouth daily. 12/22/16  Yes Birdie Sons, MD  Multiple Vitamins-Minerals (CENTRUM SILVER ADULT 50+) TABS Take 1 tablet by mouth daily.   Yes  Historical Provider, MD  sodium chloride (OCEAN) 0.65 % SOLN nasal spray Place 1 spray into both nostrils as needed for congestion.   Yes Historical Provider, MD  tadalafil (CIALIS) 5 MG tablet Take 5 mg by mouth daily as needed for erectile dysfunction. 11/07/14  Yes Historical Provider, MD  TraMADol HCl 50 MG TBDP Take 50 mg by mouth 2 (two) times daily as needed. 02/10/14  Yes Historical Provider, MD  atorvastatin (LIPITOR) 80 MG tablet TAKE 1 TABLET EVERY DAY Patient not taking: Reported on 12/19/2016 02/05/16   Birdie Sons, MD    Allergies Spironolactone and Ziac [bisoprolol-hydrochlorothiazide]  Family History  Problem Relation Age of Onset  . CAD Mother 29  . Diabetes Mother     Diabetes Mellitus  . Dementia Father   . Glaucoma Father   . Heart disease      Social History Social History  Substance Use Topics  . Smoking status: Former Smoker    Quit date: 12/01/2004  . Smokeless tobacco: Never Used  . Alcohol use 4.2 - 5.4 oz/week    1 Glasses of wine, 6 - 8 Cans of beer per week    Review of Systems Constitutional: No fever/chills Eyes: No visual changes. ENT: No sore throat. Cardiovascular: Denies chest pain. Respiratory: as above. Gastrointestinal: No abdominal pain.  No nausea, no vomiting.  No diarrhea.  No constipation. Genitourinary: Negative for dysuria. Musculoskeletal: Negative for back pain. Skin: Negative for rash. Neurological: Negative for headaches, focal weakness or numbness.  10-point ROS otherwise negative.  ____________________________________________   PHYSICAL EXAM:  VITAL SIGNS: ED Triage Vitals [01/16/17 1013]  Enc Vitals Group     BP (!) 144/111     Pulse Rate 69     Resp (!) 24     Temp 97.7 F (36.5 C)     Temp Source Oral     SpO2 98 %     Weight 185 lb (83.9 kg)     Height 5' 8.5" (1.74 m)     Head Circumference      Peak Flow      Pain Score      Pain Loc      Pain Edu?      Excl. in [place]?     Constitutional: Alert  and oriented. Well appearing and in no acute distress. Eyes: Conjunctivae are normal. PERRL. EOMI. Head: Atraumatic. Nose: No congestion/rhinnorhea. Mouth/Throat: Mucous membranes are moist.   Neck: No stridor.   Cardiovascular: Normal rate, regular rhythm. Grossly normal heart sounds.   Respiratory: Normal respiratory effort.  Mild rales to the bilateral bases. Gastrointestinal: Soft and nontender. No distention.  Musculoskeletal: No lower extremity tenderness nor edema.  No joint effusions. Neurologic:  Normal speech and language. No gross focal neurologic deficits are appreciated.  Skin:  Skin is warm, dry and intact. No rash noted. Psychiatric: Mood and affect are normal. Speech and behavior are normal.  ____________________________________________   LABS (all labs ordered  are listed, but only abnormal results are displayed)  Labs Reviewed  BASIC METABOLIC PANEL - Abnormal; Notable for the following:       Result Value   Potassium 3.3 (*)    BUN 21 (*)    Creatinine, Ser 1.71 (*)    GFR calc non Af Amer 40 (*)    GFR calc Af Amer 46 (*)    All other components within normal limits  TROPONIN I - Abnormal; Notable for the following:    Troponin I 0.05 (*)    All other components within normal limits  BRAIN NATRIURETIC PEPTIDE - Abnormal; Notable for the following:    B Natriuretic Peptide 1,101.0 (*)    All other components within normal limits  CBC   ____________________________________________  EKG  ED ECG REPORT I, Doran Stabler, the attending physician, personally viewed and interpreted this ECG.   Date: 01/16/2017  EKG Time: 1025  Rate: 66  Rhythm: normal sinus rhythm with PVC times one.  Axis: normal axis.    Intervals:none  ST&T Change: No ST segment elevation or depression. No abnormal T-wave inversion. ____________________________________________  [id number]    DG Chest 2 View (Final result)  Result time 01/16/17 11:03:09  Final result by  Gilford Silvius, MD (01/16/17 11:03:09)           Narrative:   CLINICAL DATA: Shortness of breath. Fatigue for 2 months.  EXAM: CHEST 2 VIEW  COMPARISON: 12/19/2016  FINDINGS: Small bilateral pleural effusion. Chronic cardiomegaly. Status post CABG and right ventricular ICD/ pacer implant. No pulmonary edema or air bronchogram. Borderline hyperinflation. No pneumothorax.  IMPRESSION: Cardiomegaly and small bilateral pleural effusion. No pulmonary edema.  Borderline hyperinflation. Correlate for COPD symptoms.   Electronically Signed By: Monte Fantasia M.D. On: 01/16/2017 11:02          ____________________________________________   PROCEDURES  Procedure(s) performed:   Procedures  Critical Care performed:   ____________________________________________   INITIAL IMPRESSION / ASSESSMENT AND PLAN / ED COURSE  Pertinent labs & imaging results that were available during my care of the patient were reviewed by me and considered in my medical decision making (see chart for details).  ----------------------------------------- 12:45 PM on 01/16/2017 -----------------------------------------  Patient continues to saturate 100% on nasal cannula oxygen. Continues to be pain-free. When he is lying still he is in no distress without significant shortness of breath. Also had a vast reading of 41 which qualifies for admission. The patient will be started on IV Lasix and admitted to the hospital. He was made aware of the plan for admission and is willing to comply. Signed out to Dr. Anselm Jungling.      ____________________________________________   FINAL CLINICAL IMPRESSION(S) / ED DIAGNOSES  Shortness of breath. CHF.    NEW MEDICATIONS STARTED DURING THIS VISIT:  New Prescriptions   No medications on file     Note:  This document was prepared using Dragon voice recognition software and may include unintentional dictation errors.    Orbie Pyo, MD 01/16/17 [phone]

## 2017-01-16 NOTE — H&P (Signed)
[place] [facility] [place] NAME: Chris Nguyen    MR#:  [id number]  DATE OF BIRTH:  June 28, 1951  DATE OF ADMISSION:  01/16/2017  PRIMARY CARE PHYSICIAN: Lelon Huh, MD   REQUESTING/REFERRING PHYSICIAN: [place]  CHIEF COMPLAINT:   Chief Complaint  Patient presents with  . Shortness of Breath    HISTORY OF PRESENT ILLNESS: Chris Nguyen  is a 66 y.o. male with a known history of AICD, coronary artery disease, hyperlipidemia, coronary artery bypass surgery, paroxysmal atrial fibrillation- for last 1-2 weeks has worsening in his shortness of breath with minimal exertion off only 5-10 steps walking, also has symptoms of orthopnea and cannot sleep flat at nighttime. Concerned with this finally came to emergency room today. Found to have cardiomegaly and some fluid edema. He denies drinking excessive liquids or eating excessive salt and he claims to be compliant with his heart failure medications.  PAST MEDICAL HISTORY:   Past Medical History:  Diagnosis Date  . AICD (automatic cardioverter/defibrillator) present   . BPH (benign prostatic hyperplasia)   . CAD (coronary artery disease) 05/14/2006  . Cardiac arrest ([place]) 11/12/2013   Overview:  December 2014: ICD implanted  Last Assessment & Plan:  Relevant Hx: Course: Daily Update: Today's Plan:   . Chronic systolic CHF (congestive heart failure) ([place])   . Erectile dysfunction   . H/O coronary artery bypass surgery 05/17/2015   Overview:  LIMA to LAD   . HLD (hyperlipidemia)   . Osteoarthritis of left wrist   . Paroxysmal atrial fibrillation ([place])     PAST SURGICAL HISTORY: Past Surgical History:  Procedure Laterality Date  . CARDIAC CATHETERIZATION  06/27/2008  . CARPAL TUNNEL RELEASE    . CORONARY ARTERY BYPASS GRAFT  2006 and 2014   LIMA to LAD at [facility] 2014  . MITRAL VALVE REPAIR      SOCIAL HISTORY:  Social History  Substance Use Topics  . Smoking status: Former Smoker    Quit date:  12/01/2004  . Smokeless tobacco: Never Used  . Alcohol use 4.2 - 5.4 oz/week    1 Glasses of wine, 6 - 8 Cans of beer per week    FAMILY HISTORY:  Family History  Problem Relation Age of Onset  . CAD Mother 14  . Diabetes Mother     Diabetes Mellitus  . Dementia Father   . Glaucoma Father   . Heart disease      DRUG ALLERGIES:  Allergies  Allergen Reactions  . Spironolactone Other (See Comments)    Hyperkalemia  . Ziac [Bisoprolol-Hydrochlorothiazide]     REVIEW OF SYSTEMS:   CONSTITUTIONAL: No fever, fatigue or weakness.  EYES: No blurred or double vision.  EARS, NOSE, AND THROAT: No tinnitus or ear pain.  RESPIRATORY: No cough, positive for shortness of breath, no wheezing or hemoptysis.  CARDIOVASCULAR: No chest pain, positive for orthopnea, edema.  GASTROINTESTINAL: No nausea, vomiting, diarrhea or abdominal pain.  GENITOURINARY: No dysuria, hematuria.  ENDOCRINE: No polyuria, nocturia,  HEMATOLOGY: No anemia, easy bruising or bleeding SKIN: No rash or lesion. MUSCULOSKELETAL: No joint pain or arthritis.   NEUROLOGIC: No tingling, numbness, weakness.  PSYCHIATRY: No anxiety or depression.   MEDICATIONS AT HOME:  Prior to Admission medications   Medication Sig Start Date End Date Taking? Authorizing Provider  albuterol (PROVENTIL HFA;VENTOLIN HFA) 108 (90 Base) MCG/ACT inhaler Inhale 2 puffs into the lungs every 6 (six) hours as needed for wheezing or shortness of breath. 02/08/16  Yes Birdie Sons, MD  amiodarone (PACERONE) 200 MG tablet TAKE 1 TABLET DAILY FOR ATRIAL FIBRILLATION 12/11/15  Yes Birdie Sons, MD  aspirin 81 MG chewable tablet Chew by mouth.   Yes Historical Provider, MD  azelastine (ASTELIN) 0.1 % nasal spray Place 2 sprays into both nostrils 2 (two) times daily. Use in each nostril as directed   Yes Historical Provider, MD  fluticasone (FLONASE) 50 MCG/ACT nasal spray Place 2 sprays into both nostrils daily.   Yes Historical Provider, MD  folic  acid (FOLVITE) 1 MG tablet TAKE ONE TABLET BY MOUTH ONCE DAILY 08/08/16  Yes Birdie Sons, MD  furosemide (LASIX) 40 MG tablet Take 1 tablet by mouth daily. 08/30/15 01/16/17 Yes Historical Provider, MD  lisinopril (PRINIVIL,ZESTRIL) 20 MG tablet Take 1 tablet (20 mg total) by mouth daily. 03/05/16  Yes Birdie Sons, MD  loratadine (CLARITIN) 10 MG tablet Take 10 mg by mouth daily.   Yes Historical Provider, MD  metoprolol succinate (TOPROL-XL) 50 MG 24 hr tablet Take 1 tablet (50 mg total) by mouth daily. 12/22/16  Yes Birdie Sons, MD  Multiple Vitamins-Minerals (CENTRUM SILVER ADULT 50+) TABS Take 1 tablet by mouth daily.   Yes Historical Provider, MD  sodium chloride (OCEAN) 0.65 % SOLN nasal spray Place 1 spray into both nostrils as needed for congestion.   Yes Historical Provider, MD  tadalafil (CIALIS) 5 MG tablet Take 5 mg by mouth daily as needed for erectile dysfunction. 11/07/14  Yes Historical Provider, MD  TraMADol HCl 50 MG TBDP Take 50 mg by mouth 2 (two) times daily as needed. 02/10/14  Yes Historical Provider, MD  atorvastatin (LIPITOR) 80 MG tablet TAKE 1 TABLET EVERY DAY Patient not taking: Reported on 12/19/2016 02/05/16   Birdie Sons, MD      PHYSICAL EXAMINATION:   VITAL SIGNS: Blood pressure (!) 156/122, pulse 67, temperature 97.7 F (36.5 C), temperature source Oral, resp. rate (!) 31, height 5' 8.5" (1.74 m), weight 83.9 kg (185 lb), SpO2 100 %.  GENERAL:  66 y.o.-year-old patient lying in the bed with no acute distress.  EYES: Pupils equal, round, reactive to light and accommodation. No scleral icterus. Extraocular muscles intact.  HEENT: Head atraumatic, normocephalic. Oropharynx and nasopharynx clear.  NECK:  Supple, no jugular venous distention. No thyroid enlargement, no tenderness.  LUNGS: Normal breath sounds bilaterally, no wheezing, Some crepitation. No use of accessory muscles of respiration.  CARDIOVASCULAR: S1, S2 normal. No murmurs, rubs, or gallops.  AICD in-place, scar for cardiac bypass surgery present on chest. ABDOMEN: Soft, nontender, nondistended. Bowel sounds present. No organomegaly or mass.  EXTREMITIES: No pedal edema, cyanosis, or clubbing.  NEUROLOGIC: Cranial nerves II through XII are intact. Muscle strength 5/5 in all extremities. Sensation intact. Gait not checked.  PSYCHIATRIC: The patient is alert and oriented x 3.  SKIN: No obvious rash, lesion, or ulcer.   LABORATORY PANEL:   CBC  Recent Labs Lab 01/16/17 1027  WBC 6.5  HGB 17.4  HCT 50.6  PLT 218  MCV 88.5  MCH 30.5  MCHC 34.4  RDW 14.5   ------------------------------------------------------------------------------------------------------------------  Chemistries   Recent Labs Lab 01/16/17 1027  NA 138  K 3.3*  CL 101  CO2 26  GLUCOSE 93  BUN 21*  CREATININE 1.71*  CALCIUM 9.3   ------------------------------------------------------------------------------------------------------------------ estimated creatinine clearance is 45.3 mL/min (by C-G formula based on SCr of 1.71 mg/dL (H)). ------------------------------------------------------------------------------------------------------------------ No results for input(s): TSH, T4TOTAL, T3FREE, THYROIDAB in  the last 72 hours.  Invalid input(s): FREET3   Coagulation profile No results for input(s): INR, PROTIME in the last 168 hours. ------------------------------------------------------------------------------------------------------------------- No results for input(s): DDIMER in the last 72 hours. -------------------------------------------------------------------------------------------------------------------  Cardiac Enzymes  Recent Labs Lab 01/16/17 1027  TROPONINI 0.05*   ------------------------------------------------------------------------------------------------------------------ Invalid input(s):  POCBNP  ---------------------------------------------------------------------------------------------------------------  Urinalysis No results found for: COLORURINE, APPEARANCEUR, LABSPEC, PHURINE, GLUCOSEU, HGBUR, BILIRUBINUR, KETONESUR, PROTEINUR, UROBILINOGEN, NITRITE, LEUKOCYTESUR   RADIOLOGY: Dg Chest 2 View  Result Date: 01/16/2017 CLINICAL DATA:  Shortness of breath.  Fatigue for 2 months. EXAM: CHEST  2 VIEW COMPARISON:  12/19/2016 FINDINGS: Small bilateral pleural effusion. Chronic cardiomegaly. Status post CABG and right ventricular ICD/ pacer implant. No pulmonary edema or air bronchogram. Borderline hyperinflation. No pneumothorax. IMPRESSION: Cardiomegaly and small bilateral pleural effusion. No pulmonary edema. Borderline hyperinflation.  Correlate for COPD symptoms. Electronically Signed   By: Monte Fantasia M.D.   On: 01/16/2017 11:02    EKG: Orders placed or performed during the hospital encounter of 01/16/17  . ED EKG within 10 minutes  . ED EKG within 10 minutes    IMPRESSION AND PLAN:  * Acute on chronic systolic congestive heart failure   We'll admit to telemetry, IV Lasix, follow serial troponin, fluid restriction, intake and output measurement.   As per previous echocardiogram ejection fraction is 20%.   Patient already have AICD.   He is on lisinopril, continue that.  * History of coronary artery disease   Continue home medications.  * Hypertension   Under control, continue home medications.  * History of cardiac arrest and cardiac arrhythmia     Status post AICD, on amiodarone, continue.  * Hyperlipidemia   Continue atorvastatin.    All the records are reviewed and case discussed with ED provider. Management plans discussed with the patient, family and they are in agreement.  CODE STATUS: Full Code Status History    Date Active Date Inactive Code Status Order ID Comments User Context   02/25/2016 12:58 AM 02/25/2016  8:16 PM Full Code [id number]   Lance Coon, MD ED      His wife was present in the room during my visit.  TOTAL TIME TAKING CARE OF THIS PATIENT: 50 minutes.    Vaughan Basta M.D on 01/16/2017   Between 7am to 6pm - Pager - [phone]  After 6pm go to [email or web address] - password [email or web address] [facility]  Office  [phone]  CC: Primary care physician; Lelon Huh, MD   Note: This dictation was prepared with Dragon dictation along with smaller phrase technology. Any transcriptional errors that result from this process are unintentional.

## 2017-01-17 ENCOUNTER — Inpatient Hospital Stay: Payer: Medicare HMO

## 2017-01-17 LAB — BASIC METABOLIC PANEL
Anion gap: 10 (ref 5–15)
BUN: 20 mg/dL (ref 6–20)
CHLORIDE: 102 mmol/L (ref 101–111)
CO2: 30 mmol/L (ref 22–32)
Calcium: 9.2 mg/dL (ref 8.9–10.3)
Creatinine, Ser: 1.75 mg/dL — ABNORMAL HIGH (ref 0.61–1.24)
GFR calc Af Amer: 45 mL/min — ABNORMAL LOW (ref >60)
GFR calc non Af Amer: 39 mL/min — ABNORMAL LOW (ref >60)
Glucose, Bld: 84 mg/dL (ref 65–99)
POTASSIUM: 3.1 mmol/L — AB (ref 3.5–5.1)
SODIUM: 142 mmol/L (ref 135–145)

## 2017-01-17 LAB — CBC
HEMATOCRIT: 48.9 % (ref 40.0–52.0)
Hemoglobin: 16.4 g/dL (ref 13.0–18.0)
MCH: 30.1 pg (ref 26.0–34.0)
MCHC: 33.6 g/dL (ref 32.0–36.0)
MCV: 89.5 fL (ref 80.0–100.0)
Platelets: 196 10*3/uL (ref 150–440)
RBC: 5.46 MIL/uL (ref 4.40–5.90)
RDW: 14.1 % (ref 11.5–14.5)
WBC: 6.7 10*3/uL (ref 3.8–10.6)

## 2017-01-17 LAB — MAGNESIUM: Magnesium: 2.1 mg/dL (ref 1.7–2.4)

## 2017-01-17 LAB — POTASSIUM: Potassium: 4.2 mmol/L (ref 3.5–5.1)

## 2017-01-17 LAB — TROPONIN I: TROPONIN I: 0.06 ng/mL — AB (ref <0.03)

## 2017-01-17 MED ORDER — POTASSIUM CHLORIDE CRYS ER 20 MEQ PO TBCR
40.0000 meq | EXTENDED_RELEASE_TABLET | Freq: Once | ORAL | Status: AC
  Administered 2017-01-17: 40 meq via ORAL
  Filled 2017-01-17: qty 2

## 2017-01-17 NOTE — Progress Notes (Signed)
[place] for Electrolyte Monitoring and ReplacementIndication: Hypokalemia   Allergies  Allergen Reactions  . Spironolactone Other (See Comments)    Hyperkalemia  . Ziac [Bisoprolol-Hydrochlorothiazide]     Patient Measurements: Height: 5' 8.5" (174 cm) Weight: 180 lb 1.6 oz (81.7 kg) IBW/kg (Calculated) : 69.55  Vital Signs: Temp: 97.8 F (36.6 C) (02/17 0424) Temp Source: Oral (02/17 0424) BP: 137/85 (02/17 0424) Pulse Rate: 54 (02/17 0424) Intake/Output from previous day: 02/16 0701 - 02/17 0700 In: -  Out: 2075 [Urine:2075] Intake/Output from this shift: Total I/O In: 240 [P.O.:240] Out: 500 [Urine:500] Vent settings for last 24 hours:    Labs:  Recent Labs  01/16/17 1027 01/17/17 0027  WBC 6.5 6.7  HGB 17.4 16.4  HCT 50.6 48.9  PLT 218 196  CREATININE 1.71* 1.75*   Estimated Creatinine Clearance: 40.9 mL/min (by C-G formula based on SCr of 1.75 mg/dL (H)).  No results for input(s): GLUCAP in the last 72 hours.   BMP Latest Ref Rng & Units 01/17/2017 01/16/2017 12/19/2016  Glucose 65 - 99 mg/dL 84 93 74  BUN 6 - 20 mg/dL 20 21(H) 16  Creatinine 0.61 - 1.24 mg/dL 1.75(H) 1.71(H) 2.03(H)  BUN/Creat Ratio 10 - 24 - - 8(L)  Sodium 135 - 145 mmol/L 142 138 144  Potassium 3.5 - 5.1 mmol/L 3.1(L) 3.3(L) 4.3  Chloride 101 - 111 mmol/L 102 101 98  CO2 22 - 32 mmol/L 30 26 27   Calcium 8.9 - 10.3 mg/dL 9.2 9.3 9.9   Phosphorus  Date Value Ref Range Status  05/18/2015 3.3 2.5 - 4.5 mg/dL Final   Phosphorus, Ser  Date Value Ref Range Status  02/26/2015 2.6  Final   Magnesium  Date Value Ref Range Status  11/04/2013 1.5 (L) mg/dL Final    Comment:    1.8-2.4 THERAPEUTIC RANGE: 4-7 mg/dL TOXIC: > 10 mg/dL  -----------------------     Assessment: 66 y/o M with a known h/o AICD, CAD, and PAF admitted with SOB. Pharmacy consulted to assist in managing electrolytes.   Plan:  KCl 40 meq po once and will recheck at 1800.    Ulice Dash D 01/17/2017,11:09 AM

## 2017-01-17 NOTE — Progress Notes (Addendum)
[place] [facility] [place] NAME: Chris Nguyen    MR#:  [id number]  DATE OF BIRTH:  1951/06/18  SUBJECTIVE:  Sob at times no chest pain or LEE  REVIEW OF SYSTEMS:    Review of Systems  Constitutional: Negative.  Negative for chills, fever and malaise/fatigue.  HENT: Negative.  Negative for ear discharge, ear pain, hearing loss, nosebleeds and sore throat.   Eyes: Negative.  Negative for blurred vision and pain.  Respiratory: Positive for shortness of breath. Negative for cough, hemoptysis and wheezing.   Cardiovascular: Negative.  Negative for chest pain, palpitations and leg swelling.  Gastrointestinal: Negative.  Negative for abdominal pain, blood in stool, diarrhea, nausea and vomiting.  Genitourinary: Negative.  Negative for dysuria.  Musculoskeletal: Negative.  Negative for back pain.  Skin: Negative.   Neurological: Negative for dizziness, tremors, speech change, focal weakness, seizures and headaches.  Endo/Heme/Allergies: Negative.  Does not bruise/bleed easily.  Psychiatric/Behavioral: Negative.  Negative for depression, hallucinations and suicidal ideas.    Tolerating Diet:yes      DRUG ALLERGIES:   Allergies  Allergen Reactions  . Spironolactone Other (See Comments)    Hyperkalemia  . Ziac [Bisoprolol-Hydrochlorothiazide]     VITALS:  Blood pressure (!) 141/92, pulse 62, temperature 97.6 F (36.4 C), temperature source Oral, resp. rate 19, height 5' 8.5" (1.74 m), weight 81.7 kg (180 lb 1.6 oz), SpO2 100 %.  PHYSICAL EXAMINATION:   Physical Exam  Constitutional: He is oriented to person, place, and time and well-developed, well-nourished, and in no distress. No distress.  HENT:  Head: Normocephalic.  Eyes: No scleral icterus.  Neck: Normal range of motion. Neck supple. No JVD present. No tracheal deviation present.  Cardiovascular: Normal rate, regular rhythm and normal heart sounds.  Exam reveals no gallop and no  friction rub.   No murmur heard. Pulmonary/Chest: Effort normal. No respiratory distress. He has no wheezes. He has rales. He exhibits no tenderness.  Abdominal: Soft. Bowel sounds are normal. He exhibits no distension and no mass. There is no tenderness. There is no rebound and no guarding.  Musculoskeletal: Normal range of motion. He exhibits no edema.  Neurological: He is alert and oriented to person, place, and time.  Skin: Skin is warm. No rash noted. No erythema.  Psychiatric: Affect and judgment normal.      LABORATORY PANEL:   CBC  Recent Labs Lab 01/17/17 0027  WBC 6.7  HGB 16.4  HCT 48.9  PLT 196   ------------------------------------------------------------------------------------------------------------------  Chemistries   Recent Labs Lab 01/17/17 0027  NA 142  K 3.1*  CL 102  CO2 30  GLUCOSE 84  BUN 20  CREATININE 1.75*  CALCIUM 9.2  MG 2.1   ------------------------------------------------------------------------------------------------------------------  Cardiac Enzymes  Recent Labs Lab 01/16/17 1604 01/16/17 1950 01/17/17 0021  TROPONINI 0.06* 0.06* 0.06*   ------------------------------------------------------------------------------------------------------------------  RADIOLOGY:  Dg Chest 2 View  Result Date: 01/17/2017 CLINICAL DATA:  Shortness of Breath EXAM: CHEST  2 VIEW COMPARISON:  January 16, 2017 FINDINGS: There is mild bibasilar atelectasis, stable. There are small pleural effusions bilaterally. Lungs elsewhere are clear. Heart remains enlarged with pulmonary vascular within normal limits. There is atherosclerotic calcification in the aorta. Pacemaker lead is in the right ventricular area, although the tip of the pacemaker the deviates posteriorly, unchanged. No adenopathy. No evident bone lesions. IMPRESSION: Stable pacemaker position. Stable cardiomegaly. Aortic atherosclerosis. Stable mild bibasilar atelectasis with small  pleural effusions. No new opacity. Electronically Signed  By: Lowella Grip III M.D.   On: 01/17/2017 11:19   Dg Chest 2 View  Result Date: 01/16/2017 CLINICAL DATA:  Shortness of breath.  Fatigue for 2 months. EXAM: CHEST  2 VIEW COMPARISON:  12/19/2016 FINDINGS: Small bilateral pleural effusion. Chronic cardiomegaly. Status post CABG and right ventricular ICD/ pacer implant. No pulmonary edema or air bronchogram. Borderline hyperinflation. No pneumothorax. IMPRESSION: Cardiomegaly and small bilateral pleural effusion. No pulmonary edema. Borderline hyperinflation.  Correlate for COPD symptoms. Electronically Signed   By: Monte Fantasia M.D.   On: 01/16/2017 11:02     ASSESSMENT AND PLAN:    66 y/o male  With ischemic CM Ef 20%, AICD, PAF who presents with orthopnea and PND.  1. Acute on chronic systolic congestive heart failure   Continue IV Lasix and have cardiology evaluation  ECHO requested to evaluate Most recent ejection fraction Follow BMP Continue telemetry Continue metoprolol and lisinopril Allergy to spirolactone  2. PAF: Continue Amiodarone and metoprolol for heart rate control  3. History of coronary artery disease   Continue aspirin, atorvastatin and metoprolol   4 Hypertension: Continue Synthroid and metoprolol  5. Hypokalemia:Check magnesium level Replete as per pharmacy consultation  6  History of cardiac arrest and cardiac arrhythmia     Status post AICD, on amiodarone, continue.  7 Hyperlipidemia   Continue atorvastatin  8. Elevated troponin due to demand ischemia and not ACS 9. Chronic kidney disease stage III: Creatinine is near baseline  Management plans discussed with the patient and he is in agreement.  CODE STATUS: FULL  TOTAL TIME TAKING CARE OF THIS PATIENT: 30 minutes.     POSSIBLE D/C 1-2 days, DEPENDING ON CLINICAL CONDITION.   Gillian Kluever M.D on 01/17/2017 at 12:14 PM  Between 7am to 6pm - Pager - [phone] After 6pm go  to [email or web address] - password [email or web address] [facility]  Office  [phone]  CC: Primary care physician; Lelon Huh, MD  Note: This dictation was prepared with Dragon dictation along with smaller phrase technology. Any transcriptional errors that result from this process are unintentional.

## 2017-01-17 NOTE — Consult Note (Signed)
Reason for Consult: Shortness of breath congestion Referring Physician: Dr. Caryn Section primary, Dr Anselm Jungling hospitalist Cardiologist: Lanier Prude is an 66 y.o. male.  HPI: Patient is well-known to me history of cardiomyopathy systolic dysfunction EF of around 25-30% patient has a AICD in place. Patient has known heart failure symptoms dyspnea shortness of breath and leg edema fatigue. Patient's also had paroxysmal atrial fibrillation for anti-coagulation. Because of bleeding in the past. A. fib is been controlled with amiodarone heart failure with lisinopril metoprolol and Lasix. Patient was recently referred to ENT for evaluation of congestion she's been on loratadine without significant improvement in symptoms and presented to the emergency room for further assessment because of shortness of breath. Denies any chest pain denies any palpitations no blackout spells or syncope no worsening leg edema. No AICD discharges  Past Medical History:  Diagnosis Date  . AICD (automatic cardioverter/defibrillator) present   . BPH (benign prostatic hyperplasia)   . CAD (coronary artery disease) 05/14/2006  . Cardiac arrest ([place]) 11/12/2013   Overview:  December 2014: ICD implanted  Last Assessment & Plan:  Relevant Hx: Course: Daily Update: Today's Plan:   . Chronic systolic CHF (congestive heart failure) ([place])   . Erectile dysfunction   . H/O coronary artery bypass surgery 05/17/2015   Overview:  LIMA to LAD   . HLD (hyperlipidemia)   . Osteoarthritis of left wrist   . Paroxysmal atrial fibrillation [facility])     Past Surgical History:  Procedure Laterality Date  . CARDIAC CATHETERIZATION  06/27/2008  . CARPAL TUNNEL RELEASE    . CORONARY ARTERY BYPASS GRAFT  2006 and 2014   LIMA to LAD at [facility] 2014  . MITRAL VALVE REPAIR      Family History  Problem Relation Age of Onset  . CAD Mother 13  . Diabetes Mother     Diabetes Mellitus  . Dementia Father   . Glaucoma Father   . Heart disease       Social History:  reports that he quit smoking about 12 years ago. He has never used smokeless tobacco. He reports that he drinks about 4.2 - 5.4 oz of alcohol per week . He reports that he does not use drugs.  Allergies:  Allergies  Allergen Reactions  . Spironolactone Other (See Comments)    Hyperkalemia  . Ziac [Bisoprolol-Hydrochlorothiazide]     Medications: I have reviewed the patient's current medications.  Results for orders placed or performed during the hospital encounter of 01/16/17 (from the past 48 hour(s))  Basic metabolic panel     Status: Abnormal   Collection Time: 01/16/17 10:27 AM  Result Value Ref Range   Sodium 138 135 - 145 mmol/L   Potassium 3.3 (L) 3.5 - 5.1 mmol/L   Chloride 101 101 - 111 mmol/L   CO2 26 22 - 32 mmol/L   Glucose, Bld 93 65 - 99 mg/dL   BUN 21 (H) 6 - 20 mg/dL   Creatinine, Ser 1.71 (H) 0.61 - 1.24 mg/dL   Calcium 9.3 8.9 - 10.3 mg/dL   GFR calc non Af Amer 40 (L) >60 mL/min   GFR calc Af Amer 46 (L) >60 mL/min    Comment: (NOTE) The eGFR has been calculated using the CKD EPI equation. This calculation has not been validated in all clinical situations. eGFR's persistently <60 mL/min signify possible Chronic Kidney Disease.    Anion gap 11 5 - 15  CBC     Status: None   Collection  Time: 01/16/17 10:27 AM  Result Value Ref Range   WBC 6.5 3.8 - 10.6 K/uL   RBC 5.71 4.40 - 5.90 MIL/uL   Hemoglobin 17.4 13.0 - 18.0 g/dL   HCT 50.6 40.0 - 52.0 %   MCV 88.5 80.0 - 100.0 fL   MCH 30.5 26.0 - 34.0 pg   MCHC 34.4 32.0 - 36.0 g/dL   RDW 14.5 11.5 - 14.5 %   Platelets 218 150 - 440 K/uL  Troponin I     Status: Abnormal   Collection Time: 01/16/17 10:27 AM  Result Value Ref Range   Troponin I 0.05 (HH) <0.03 ng/mL    Comment: CRITICAL RESULT CALLED TO, READ BACK BY AND VERIFIED WITH ANNA HOLT @ 1107 01/16/17 BY MNS/TCH   Brain natriuretic peptide     Status: Abnormal   Collection Time: 01/16/17 10:27 AM  Result Value Ref Range    B Natriuretic Peptide 1,101.0 (H) 0.0 - 100.0 pg/mL  Troponin I     Status: Abnormal   Collection Time: 01/16/17  4:04 PM  Result Value Ref Range   Troponin I 0.06 (HH) <0.03 ng/mL    Comment: CRITICAL VALUE NOTED. VALUE IS CONSISTENT WITH PREVIOUSLY REPORTED/CALLED VALUE. [place].  Troponin I     Status: Abnormal   Collection Time: 01/16/17  7:50 PM  Result Value Ref Range   Troponin I 0.06 (HH) <0.03 ng/mL    Comment: CRITICAL VALUE NOTED. VALUE IS CONSISTENT WITH PREVIOUSLY REPORTED/CALLED VALUE [facility]   Troponin I     Status: Abnormal   Collection Time: 01/17/17 12:21 AM  Result Value Ref Range   Troponin I 0.06 (HH) <0.03 ng/mL    Comment: CRITICAL VALUE NOTED. VALUE IS CONSISTENT WITH PREVIOUSLY REPORTED/CALLED VALUE [facility]   Basic metabolic panel     Status: Abnormal   Collection Time: 01/17/17 12:27 AM  Result Value Ref Range   Sodium 142 135 - 145 mmol/L   Potassium 3.1 (L) 3.5 - 5.1 mmol/L   Chloride 102 101 - 111 mmol/L   CO2 30 22 - 32 mmol/L   Glucose, Bld 84 65 - 99 mg/dL   BUN 20 6 - 20 mg/dL   Creatinine, Ser 1.75 (H) 0.61 - 1.24 mg/dL   Calcium 9.2 8.9 - 10.3 mg/dL   GFR calc non Af Amer 39 (L) >60 mL/min   GFR calc Af Amer 45 (L) >60 mL/min    Comment: (NOTE) The eGFR has been calculated using the CKD EPI equation. This calculation has not been validated in all clinical situations. eGFR's persistently <60 mL/min signify possible Chronic Kidney Disease.    Anion gap 10 5 - 15  CBC     Status: None   Collection Time: 01/17/17 12:27 AM  Result Value Ref Range   WBC 6.7 3.8 - 10.6 K/uL   RBC 5.46 4.40 - 5.90 MIL/uL   Hemoglobin 16.4 13.0 - 18.0 g/dL   HCT 48.9 40.0 - 52.0 %   MCV 89.5 80.0 - 100.0 fL   MCH 30.1 26.0 - 34.0 pg   MCHC 33.6 32.0 - 36.0 g/dL   RDW 14.1 11.5 - 14.5 %   Platelets 196 150 - 440 K/uL  Magnesium     Status: None   Collection Time: 01/17/17 12:27 AM  Result Value Ref Range   Magnesium 2.1 1.7 - 2.4 mg/dL  Potassium     Status: None    Collection Time: 01/17/17  6:14 PM  Result Value Ref Range  Potassium 4.2 3.5 - 5.1 mmol/L    Dg Chest 2 View  Result Date: 01/17/2017 CLINICAL DATA:  Shortness of Breath EXAM: CHEST  2 VIEW COMPARISON:  January 16, 2017 FINDINGS: There is mild bibasilar atelectasis, stable. There are small pleural effusions bilaterally. Lungs elsewhere are clear. Heart remains enlarged with pulmonary vascular within normal limits. There is atherosclerotic calcification in the aorta. Pacemaker lead is in the right ventricular area, although the tip of the pacemaker the deviates posteriorly, unchanged. No adenopathy. No evident bone lesions. IMPRESSION: Stable pacemaker position. Stable cardiomegaly. Aortic atherosclerosis. Stable mild bibasilar atelectasis with small pleural effusions. No new opacity. Electronically Signed   By: Lowella Grip III M.D.   On: 01/17/2017 11:19   Dg Chest 2 View  Result Date: 01/16/2017 CLINICAL DATA:  Shortness of breath.  Fatigue for 2 months. EXAM: CHEST  2 VIEW COMPARISON:  12/19/2016 FINDINGS: Small bilateral pleural effusion. Chronic cardiomegaly. Status post CABG and right ventricular ICD/ pacer implant. No pulmonary edema or air bronchogram. Borderline hyperinflation. No pneumothorax. IMPRESSION: Cardiomegaly and small bilateral pleural effusion. No pulmonary edema. Borderline hyperinflation.  Correlate for COPD symptoms. Electronically Signed   By: Monte Fantasia M.D.   On: 01/16/2017 11:02    Review of Systems  Constitutional: Positive for diaphoresis and malaise/fatigue.  HENT: Positive for congestion.   Eyes: Negative.   Respiratory: Positive for cough, shortness of breath and wheezing.   Cardiovascular: Positive for orthopnea, leg swelling and PND.  Gastrointestinal: Negative.   Genitourinary: Negative.   Musculoskeletal: Negative.   Skin: Negative.   Neurological: Positive for dizziness, tingling, focal weakness, weakness and headaches.   Endo/Heme/Allergies: Negative.    Blood pressure 116/77, pulse (!) 55, temperature 98.1 F (36.7 C), temperature source Oral, resp. rate 17, height 5' 8.5" (1.74 m), weight 81.7 kg (180 lb 1.6 oz), SpO2 99 %. Physical Exam  Nursing note and vitals reviewed. Constitutional: He is oriented to person, place, and time. He appears well-developed and well-nourished.  HENT:  Head: Normocephalic and atraumatic.  Eyes: Conjunctivae and EOM are normal. Pupils are equal, round, and reactive to light.  Neck: Normal range of motion. Neck supple.  Cardiovascular: Normal rate and regular rhythm.  Exam reveals gallop.   Murmur heard. Respiratory: Effort normal and breath sounds normal.  GI: Soft. Bowel sounds are normal.  Musculoskeletal: Normal range of motion.  Neurological: He is alert and oriented to person, place, and time. He has normal reflexes.  Skin: Skin is warm and dry.  Psychiatric: He has a normal mood and affect.    Assessment/Plan: Congestive heart failure Shortness of breath Cardiomyopathy systolic dysfunction Hyperlipidemia Hypertension Paroxysmal A. Fib GERD Coronary artery disease Chronic renal insufficiency stage III AICD in place  hyperlipidemia Left leg weakness . Plan Agree with telemetry Continue current medical therapy Continue inhalers for shortness of breath possible COPD Maintain amiodarone for paroxysmal atrial fibrillation Hypertension control with lisinopril metoprolol Lasix   continue Lipitor for hyperlipidemia Currently on loratadine for sinus congestion consider referral to ENT Continue Lasix therapy Recommend conservative medical therapy do not recommend cardiac catheter at this point   Lajean Boese D Santrice Muzio 01/17/2017, 10:04 PM

## 2017-01-18 ENCOUNTER — Inpatient Hospital Stay
Admit: 2017-01-18 | Discharge: 2017-01-18 | Disposition: A | Discharge status: Home / Self Care | Payer: Medicare HMO | Attending: Internal Medicine | Admitting: Internal Medicine

## 2017-01-18 LAB — ECHOCARDIOGRAM COMPLETE
Height: 68.5 in
Weight: 2860.8 oz

## 2017-01-18 LAB — BASIC METABOLIC PANEL
Anion gap: 10 (ref 5–15)
BUN: 28 mg/dL — AB (ref 6–20)
CHLORIDE: 96 mmol/L — AB (ref 101–111)
CO2: 32 mmol/L (ref 22–32)
Calcium: 9.2 mg/dL (ref 8.9–10.3)
Creatinine, Ser: 2.08 mg/dL — ABNORMAL HIGH (ref 0.61–1.24)
GFR calc Af Amer: 37 mL/min — ABNORMAL LOW (ref >60)
GFR calc non Af Amer: 32 mL/min — ABNORMAL LOW (ref >60)
GLUCOSE: 85 mg/dL (ref 65–99)
POTASSIUM: 3.4 mmol/L — AB (ref 3.5–5.1)
Sodium: 138 mmol/L (ref 135–145)

## 2017-01-18 LAB — MAGNESIUM: Magnesium: 2.3 mg/dL (ref 1.7–2.4)

## 2017-01-18 LAB — PHOSPHORUS: Phosphorus: 3.7 mg/dL (ref 2.5–4.6)

## 2017-01-18 NOTE — Progress Notes (Signed)
Subjective:  Patient states to be done reasonably well has some mild shortness of breath with improves denies any pain has been ambulating in the hall  Objective:  Vital Signs in the last 24 hours: Temp:  [97.7 F (36.5 C)-98.4 F (36.9 C)] 98.4 F (36.9 C) (02/18 1150) Pulse Rate:  [50-59] 59 (02/18 1150) Resp:  [16-18] 18 (02/18 1150) BP: (116-139)/(77-97) 127/93 (02/18 1150) SpO2:  [93 %-100 %] 98 % (02/18 1150) Weight:  [81.1 kg (178 lb 12.8 oz)] 81.1 kg (178 lb 12.8 oz) (02/18 0443)  Intake/Output from previous day: 02/17 0701 - 02/18 0700 In: 480 [P.O.:480] Out: 1025 [Urine:1025] Intake/Output from this shift: Total I/O In: 240 [P.O.:240] Out: 300 [Urine:300]  Physical Exam: General appearance: appears stated age Neck: no adenopathy, no carotid bruit, no JVD, supple, symmetrical, trachea midline and thyroid not enlarged, symmetric, no tenderness/mass/nodules Lungs: diminished breath sounds bibasilar and bilaterally Heart: irregularly irregular rhythm and no S3 or S4 Abdomen: soft, non-tender; bowel sounds normal; no masses,  no organomegaly Extremities: extremities normal, atraumatic, no cyanosis or edema Pulses: 2+ and symmetric Skin: Skin color, texture, turgor normal. No rashes or lesions Neurologic: Alert and oriented X 3, normal strength and tone. Normal symmetric reflexes. Normal coordination and gait  Lab Results:  Recent Labs  01/16/17 1027 01/17/17 0027  WBC 6.5 6.7  HGB 17.4 16.4  PLT 218 196    Recent Labs  01/17/17 0027 01/17/17 1814 01/18/17 0530  NA 142  --  138  K 3.1* 4.2 3.4*  CL 102  --  96*  CO2 30  --  32  GLUCOSE 84  --  85  BUN 20  --  28*  CREATININE 1.75*  --  2.08*    Recent Labs  01/16/17 1950 01/17/17 0021  TROPONINI 0.06* 0.06*   Hepatic Function Panel No results for input(s): PROT, ALBUMIN, AST, ALT, ALKPHOS, BILITOT, BILIDIR, IBILI in the last 72 hours. No results for input(s): CHOL in the last 72 hours. No  results for input(s): PROTIME in the last 72 hours.  Imaging: Imaging results have been reviewed  Cardiac Studies:  Assessment/Plan:  Angina Arrhythmia Atrial Fibrillation CABG Cardiomyopathy Chest Pain CHF Coronary Artery Disease Edema MI Palpitations Shortness of Breath  Cardiomyopathy systolic dysfunction Left leg weakness . Plan Increase activity ambulate in the halls Continue lipid management with Lipitor Agree with amiodarone for A. Fib Do not recommend anticoagulation because of previous bleeding Continue treatment for congestive heart failure cardiomyopathy Maintain low-dose diuretic therapy for shortness of breath heart failure Inhalers as necessary Follow-up with ENT sinus congestion Have the patient follow-up with cardiology once his discharge 1-2 weeks  LOS: 2 days    Dwayne D Callwood 01/18/2017, 2:50 PM

## 2017-01-18 NOTE — Progress Notes (Signed)
[place] [facility] [place] NAME: Decory Freni    MR#:  [id number]  DATE OF BIRTH:  1951-09-02  SUBJECTIVE:  Sob improved No chest pain good UOP  REVIEW OF SYSTEMS:    Review of Systems  Constitutional: Negative.  Negative for chills, fever and malaise/fatigue.  HENT: Negative.  Negative for ear discharge, ear pain, hearing loss, nosebleeds and sore throat.   Eyes: Negative.  Negative for blurred vision and pain.  Respiratory: Negative for cough, hemoptysis, shortness of breath and wheezing.   Cardiovascular: Negative.  Negative for chest pain, palpitations and leg swelling.  Gastrointestinal: Negative.  Negative for abdominal pain, blood in stool, diarrhea, nausea and vomiting.  Genitourinary: Negative.  Negative for dysuria.  Musculoskeletal: Negative.  Negative for back pain.  Skin: Negative.   Neurological: Negative for dizziness, tremors, speech change, focal weakness, seizures and headaches.  Endo/Heme/Allergies: Negative.  Does not bruise/bleed easily.  Psychiatric/Behavioral: Negative.  Negative for depression, hallucinations and suicidal ideas.    Tolerating Diet:yes      DRUG ALLERGIES:   Allergies  Allergen Reactions  . Spironolactone Other (See Comments)    Hyperkalemia  . Ziac [Bisoprolol-Hydrochlorothiazide]     VITALS:  Blood pressure (!) 129/97, pulse (!) 52, temperature 97.7 F (36.5 C), temperature source Oral, resp. rate 16, height 5' 8.5" (1.74 m), weight 81.1 kg (178 lb 12.8 oz), SpO2 93 %.  PHYSICAL EXAMINATION:   Physical Exam  Constitutional: He is oriented to person, place, and time and well-developed, well-nourished, and in no distress. No distress.  HENT:  Head: Normocephalic.  Eyes: No scleral icterus.  Neck: Normal range of motion. Neck supple. No JVD present. No tracheal deviation present.  Cardiovascular: Normal rate, regular rhythm and normal heart sounds.  Exam reveals no gallop and no friction  rub.   No murmur heard. Pulmonary/Chest: Effort normal and breath sounds normal. No respiratory distress. He has no wheezes. He has no rales. He exhibits no tenderness.  Abdominal: Soft. Bowel sounds are normal. He exhibits no distension and no mass. There is no tenderness. There is no rebound and no guarding.  Musculoskeletal: Normal range of motion. He exhibits no edema.  Neurological: He is alert and oriented to person, place, and time.  Skin: Skin is warm. No rash noted. No erythema.  Psychiatric: Affect and judgment normal.      LABORATORY PANEL:   CBC  Recent Labs Lab 01/17/17 0027  WBC 6.7  HGB 16.4  HCT 48.9  PLT 196   ------------------------------------------------------------------------------------------------------------------  Chemistries   Recent Labs Lab 01/18/17 0530  NA 138  K 3.4*  CL 96*  CO2 32  GLUCOSE 85  BUN 28*  CREATININE 2.08*  CALCIUM 9.2  MG 2.3   ------------------------------------------------------------------------------------------------------------------  Cardiac Enzymes  Recent Labs Lab 01/16/17 1604 01/16/17 1950 01/17/17 0021  TROPONINI 0.06* 0.06* 0.06*   ------------------------------------------------------------------------------------------------------------------  RADIOLOGY:  Dg Chest 2 View  Result Date: 01/17/2017 CLINICAL DATA:  Shortness of Breath EXAM: CHEST  2 VIEW COMPARISON:  January 16, 2017 FINDINGS: There is mild bibasilar atelectasis, stable. There are small pleural effusions bilaterally. Lungs elsewhere are clear. Heart remains enlarged with pulmonary vascular within normal limits. There is atherosclerotic calcification in the aorta. Pacemaker lead is in the right ventricular area, although the tip of the pacemaker the deviates posteriorly, unchanged. No adenopathy. No evident bone lesions. IMPRESSION: Stable pacemaker position. Stable cardiomegaly. Aortic atherosclerosis. Stable mild bibasilar  atelectasis with small pleural effusions. No new opacity.  Electronically Signed   By: Lowella Grip III M.D.   On: 01/17/2017 11:19   Dg Chest 2 View  Result Date: 01/16/2017 CLINICAL DATA:  Shortness of breath.  Fatigue for 2 months. EXAM: CHEST  2 VIEW COMPARISON:  12/19/2016 FINDINGS: Small bilateral pleural effusion. Chronic cardiomegaly. Status post CABG and right ventricular ICD/ pacer implant. No pulmonary edema or air bronchogram. Borderline hyperinflation. No pneumothorax. IMPRESSION: Cardiomegaly and small bilateral pleural effusion. No pulmonary edema. Borderline hyperinflation.  Correlate for COPD symptoms. Electronically Signed   By: Monte Fantasia M.D.   On: 01/16/2017 11:02     ASSESSMENT AND PLAN:    66 y/o male  With ischemic CM Ef 20%, AICD, PAF who presents with orthopnea and PND.  1. Acute on chronic systolic congestive heart failure   Patient has diuresed well with IV Lasix. Due to increasing creatinine I will stop IV Lasix. Repeat BMP in a.m. Follow up on echocardiogram   Continue telemetry Continue metoprolol and lisinopril He has Allergy listed to spirolactone  2. PAF: Continue Amiodarone and metoprolol for heart rate control  3. History of coronary artery disease   Continue aspirin, atorvastatin and metoprolol   4 Hypertension: Continue Synthroid and metoprolol  5. Hypokalemia:Replete as per pharmacy consultation  6  History of cardiac arrest and cardiac arrhythmia     Status post AICD, on amiodarone, continue.  7 Hyperlipidemia   Continue atorvastatin  8. Elevated troponin due to demand ischemia and not ACS 9. Chronic kidney disease stage III: Plan as outlined above  Management plans discussed with the patient and he is in agreement.  CODE STATUS: FULL  TOTAL TIME TAKING CARE OF THIS PATIENT: 24 minutes.     POSSIBLE D/C tomorrow, DEPENDING ON CLINICAL CONDITION.   Raesean Bartoletti M.D on 01/18/2017 at 10:56 AM  Between 7am to 6pm -  Pager - [phone] After 6pm go to [email or web address] - password [email or web address] [facility]  Office  [phone]  CC: Primary care physician; Lelon Huh, MD  Note: This dictation was prepared with Dragon dictation along with smaller phrase technology. Any transcriptional errors that result from this process are unintentional.

## 2017-01-18 NOTE — Progress Notes (Signed)
Pt weaned off 02 today. sats 94 on r/a.  Ambulated on room air with cane assist around nurses' station and tolerated it well.

## 2017-01-19 ENCOUNTER — Telehealth: Payer: Self-pay

## 2017-01-19 ENCOUNTER — Encounter: Payer: Self-pay | Admitting: *Deleted

## 2017-01-19 LAB — BASIC METABOLIC PANEL
Anion gap: 8 (ref 5–15)
BUN: 27 mg/dL — AB (ref 6–20)
CO2: 31 mmol/L (ref 22–32)
CREATININE: 1.8 mg/dL — AB (ref 0.61–1.24)
Calcium: 9.3 mg/dL (ref 8.9–10.3)
Chloride: 100 mmol/L — ABNORMAL LOW (ref 101–111)
GFR calc Af Amer: 44 mL/min — ABNORMAL LOW (ref >60)
GFR, EST NON AFRICAN AMERICAN: 38 mL/min — AB (ref >60)
Glucose, Bld: 91 mg/dL (ref 65–99)
POTASSIUM: 3.4 mmol/L — AB (ref 3.5–5.1)
SODIUM: 139 mmol/L (ref 135–145)

## 2017-01-19 MED ORDER — POTASSIUM CHLORIDE CRYS ER 20 MEQ PO TBCR: 40.0000 meq | EXTENDED_RELEASE_TABLET | Freq: Once | ORAL | Status: DC

## 2017-01-19 MED ORDER — FUROSEMIDE 40 MG PO TABS: 40.0000 mg | ORAL_TABLET | Freq: Every day | ORAL | 0 refills | Status: DC

## 2017-01-19 NOTE — Discharge Summary (Signed)
[facility] [place] NAME: Chris Nguyen    MR#:  [id number]  DATE OF BIRTH:  Apr 27, 1951  DATE OF ADMISSION:  01/16/2017 ADMITTING PHYSICIAN: Vaughan Basta, MD  DATE OF DISCHARGE: 01/19/2017  PRIMARY CARE PHYSICIAN: Lelon Huh, MD    ADMISSION DIAGNOSIS:  Acute on chronic systolic CHF (congestive heart failure) (HCC) [I50.23] Congestive heart failure, unspecified congestive heart failure chronicity, unspecified congestive heart failure type ([place]) [I50.9]  DISCHARGE DIAGNOSIS:  Principal Problem:   Acute on chronic systolic CHF (congestive heart failure) ([place])   SECONDARY DIAGNOSIS:   Past Medical History:  Diagnosis Date  . AICD (automatic cardioverter/defibrillator) present   . BPH (benign prostatic hyperplasia)   . CAD (coronary artery disease) 05/14/2006  . Cardiac arrest ([place]) 11/12/2013   Overview:  December 2014: ICD implanted  Last Assessment & Plan:  Relevant Hx: Course: Daily Update: Today's Plan:   . Chronic systolic CHF (congestive heart failure) ([place])   . Erectile dysfunction   . H/O coronary artery bypass surgery 05/17/2015   Overview:  LIMA to LAD   . HLD (hyperlipidemia)   . Osteoarthritis of left wrist   . Paroxysmal atrial fibrillation [facility])     HOSPITAL COURSE:    66 y/o male  With ischemic CM Ef 20%, AICD, PAF who presents with orthopnea and PND.  1. Acute on chronic systolic congestive heart failure Patient has diuresed well with IV Lasix. Due to increasing creatinine, IV Lasix was discontinued. Patient's creatinine is now at baseline. Patient may resume her outpatient dose of Lasix. He will continue on beta blocker and ACE inhibitor.  Echocardiogram was performed during this hospitalization. Ejection fraction remains at 20-25%.   Continue telemetry He has an Allergy listed to spirolactone  2. PAF: Continue Amiodarone and metoprolol for heart rate control  3. History of coronary artery  disease Continue aspirin, atorvastatin and metoprolol   4 Hypertension: Continue ice and a pro-and metoprolol  5. Hypokalemia:This was repleted. 6  History of cardiac arrest and cardiac arrhythmia  Status post AICD, on amiodarone, continue.  7 Hyperlipidemia Continue atorvastatin  8. Elevated troponin due to demand ischemia and not ACS 9. Acute on Chronic kidney disease stage III: Creatinine has now improved and is at baseline.  10. Sinusitis: Patient follows up with ENT. DISCHARGE CONDITIONS AND DIET:   Patient is stable for discharge on cardiac diet  CONSULTS OBTAINED:  Treatment Team:  Yolonda Kida, MD  DRUG ALLERGIES:   Allergies  Allergen Reactions  . Spironolactone Other (See Comments)    Hyperkalemia  . Ziac [Bisoprolol-Hydrochlorothiazide]     DISCHARGE MEDICATIONS:   Current Discharge Medication List    CONTINUE these medications which have CHANGED   Details  furosemide (LASIX) 40 MG tablet Take 1 tablet (40 mg total) by mouth daily. Qty: 30 tablet, Refills: 0      CONTINUE these medications which have NOT CHANGED   Details  albuterol (PROVENTIL HFA;VENTOLIN HFA) 108 (90 Base) MCG/ACT inhaler Inhale 2 puffs into the lungs every 6 (six) hours as needed for wheezing or shortness of breath. Qty: 1 Inhaler, Refills: 0   Associated Diagnoses: Bronchitis    amiodarone (PACERONE) 200 MG tablet TAKE 1 TABLET DAILY FOR ATRIAL FIBRILLATION Qty: 90 tablet, Refills: 3    aspirin 81 MG chewable tablet Chew by mouth.    fluticasone (FLONASE) 50 MCG/ACT nasal spray Place 2 sprays into both nostrils daily.    folic acid (FOLVITE) 1 MG tablet TAKE  ONE TABLET BY MOUTH ONCE DAILY Qty: 30 tablet, Refills: 12    lisinopril (PRINIVIL,ZESTRIL) 20 MG tablet Take 1 tablet (20 mg total) by mouth daily. Qty: 90 tablet, Refills: 4   Associated Diagnoses: Acute on chronic systolic CHF (congestive heart failure) ([place]); Cardiomyopathy ([place]); Hypertensive  cardiomyopathy, with heart failure (HCC)    loratadine (CLARITIN) 10 MG tablet Take 10 mg by mouth daily.    metoprolol succinate (TOPROL-XL) 50 MG 24 hr tablet Take 1 tablet (50 mg total) by mouth daily. Qty: 90 tablet, Refills: 3    Multiple Vitamins-Minerals (CENTRUM SILVER ADULT 50+) TABS Take 1 tablet by mouth daily.    sodium chloride (OCEAN) 0.65 % SOLN nasal spray Place 1 spray into both nostrils as needed for congestion.    tadalafil (CIALIS) 5 MG tablet Take 5 mg by mouth daily as needed for erectile dysfunction.    TraMADol HCl 50 MG TBDP Take 50 mg by mouth 2 (two) times daily as needed.    atorvastatin (LIPITOR) 80 MG tablet TAKE 1 TABLET EVERY DAY Qty: 90 tablet, Refills: 3      STOP taking these medications     azelastine (ASTELIN) 0.1 % nasal spray           Today   CHIEF COMPLAINT:   Well this morning. Has some sinus drainage.   VITAL SIGNS:  Blood pressure (!) 131/106, pulse (!) 56, temperature 97.8 F (36.6 C), temperature source Oral, resp. rate 20, height 5' 8.5" (1.74 m), weight 82.1 kg (180 lb 14.4 oz), SpO2 96 %.   REVIEW OF SYSTEMS:  Review of Systems  Constitutional: Negative.  Negative for chills, fever and malaise/fatigue.  HENT: Negative for ear discharge, ear pain, hearing loss, nosebleeds and sore throat.        Sinus drainage  Eyes: Negative.  Negative for blurred vision and pain.  Respiratory: Negative.  Negative for cough, hemoptysis, shortness of breath and wheezing.   Cardiovascular: Negative.  Negative for chest pain, palpitations and leg swelling.  Gastrointestinal: Negative.  Negative for abdominal pain, blood in stool, diarrhea, nausea and vomiting.  Genitourinary: Negative.  Negative for dysuria.  Musculoskeletal: Negative.  Negative for back pain.  Skin: Negative.   Neurological: Negative for dizziness, tremors, speech change, focal weakness, seizures and headaches.  Endo/Heme/Allergies: Negative.  Does not bruise/bleed  easily.  Psychiatric/Behavioral: Negative.  Negative for depression, hallucinations and suicidal ideas.     PHYSICAL EXAMINATION:  GENERAL:  66 y.o.-year-old patient lying in the bed with no acute distress.  NECK:  Supple, no jugular venous distention. No thyroid enlargement, no tenderness.  LUNGS: Normal breath sounds bilaterally, no wheezing, rales,rhonchi  No use of accessory muscles of respiration.  CARDIOVASCULAR: S1, S2 normal. No murmurs, rubs, or gallops.  ABDOMEN: Soft, non-tender, non-distended. Bowel sounds present. No organomegaly or mass.  EXTREMITIES: No pedal edema, cyanosis, or clubbing.  PSYCHIATRIC: The patient is alert and oriented x 3.  SKIN: No obvious rash, lesion, or ulcer.   DATA REVIEW:   CBC  Recent Labs Lab 01/17/17 0027  WBC 6.7  HGB 16.4  HCT 48.9  PLT 196    Chemistries   Recent Labs Lab 01/18/17 0530 01/19/17 0613  NA 138 139  K 3.4* 3.4*  CL 96* 100*  CO2 32 31  GLUCOSE 85 91  BUN 28* 27*  CREATININE 2.08* 1.80*  CALCIUM 9.2 9.3  MG 2.3  --     Cardiac Enzymes  Recent Labs Lab 01/16/17 1604 01/16/17 1950  01/17/17 0021  TROPONINI 0.06* 0.06* 0.06*    Microbiology Results  @MICRORSLT48 @  RADIOLOGY:  Dg Chest 2 View  Result Date: 01/17/2017 CLINICAL DATA:  Shortness of Breath EXAM: CHEST  2 VIEW COMPARISON:  January 16, 2017 FINDINGS: There is mild bibasilar atelectasis, stable. There are small pleural effusions bilaterally. Lungs elsewhere are clear. Heart remains enlarged with pulmonary vascular within normal limits. There is atherosclerotic calcification in the aorta. Pacemaker lead is in the right ventricular area, although the tip of the pacemaker the deviates posteriorly, unchanged. No adenopathy. No evident bone lesions. IMPRESSION: Stable pacemaker position. Stable cardiomegaly. Aortic atherosclerosis. Stable mild bibasilar atelectasis with small pleural effusions. No new opacity. Electronically Signed   By: Lowella Grip III M.D.   On: 01/17/2017 11:19      Current Discharge Medication List    CONTINUE these medications which have CHANGED   Details  furosemide (LASIX) 40 MG tablet Take 1 tablet (40 mg total) by mouth daily. Qty: 30 tablet, Refills: 0      CONTINUE these medications which have NOT CHANGED   Details  albuterol (PROVENTIL HFA;VENTOLIN HFA) 108 (90 Base) MCG/ACT inhaler Inhale 2 puffs into the lungs every 6 (six) hours as needed for wheezing or shortness of breath. Qty: 1 Inhaler, Refills: 0   Associated Diagnoses: Bronchitis    amiodarone (PACERONE) 200 MG tablet TAKE 1 TABLET DAILY FOR ATRIAL FIBRILLATION Qty: 90 tablet, Refills: 3    aspirin 81 MG chewable tablet Chew by mouth.    fluticasone (FLONASE) 50 MCG/ACT nasal spray Place 2 sprays into both nostrils daily.    folic acid (FOLVITE) 1 MG tablet TAKE ONE TABLET BY MOUTH ONCE DAILY Qty: 30 tablet, Refills: 12    lisinopril (PRINIVIL,ZESTRIL) 20 MG tablet Take 1 tablet (20 mg total) by mouth daily. Qty: 90 tablet, Refills: 4   Associated Diagnoses: Acute on chronic systolic CHF (congestive heart failure) ([place]); Cardiomyopathy ([place]); Hypertensive cardiomyopathy, with heart failure (HCC)    loratadine (CLARITIN) 10 MG tablet Take 10 mg by mouth daily.    metoprolol succinate (TOPROL-XL) 50 MG 24 hr tablet Take 1 tablet (50 mg total) by mouth daily. Qty: 90 tablet, Refills: 3    Multiple Vitamins-Minerals (CENTRUM SILVER ADULT 50+) TABS Take 1 tablet by mouth daily.    sodium chloride (OCEAN) 0.65 % SOLN nasal spray Place 1 spray into both nostrils as needed for congestion.    tadalafil (CIALIS) 5 MG tablet Take 5 mg by mouth daily as needed for erectile dysfunction.    TraMADol HCl 50 MG TBDP Take 50 mg by mouth 2 (two) times daily as needed.    atorvastatin (LIPITOR) 80 MG tablet TAKE 1 TABLET EVERY DAY Qty: 90 tablet, Refills: 3      STOP taking these medications     azelastine (ASTELIN) 0.1 % nasal  spray           Beta Blocker Prescribed? Yes For EF <40%, was ACEI/ARB Prescribed? Yes  For EF <40%, Aldosterone Inhibitor Prescribed? No: allergy Was EF assessed during THIS hospitalization? Yes   Management plans discussed with the patient and he is in agreement. Stable for discharge home  Patient should follow up with cardiology3  CODE STATUS:     Code Status Orders        Start     Ordered   01/16/17 1521  Full code  Continuous     01/16/17 1521    Code Status History    Date Active  Date Inactive Code Status Order ID Comments User Context   02/25/2016 12:58 AM 02/25/2016  8:16 PM Full Code [id number]  Lance Coon, MD ED      TOTAL TIME TAKING CARE OF THIS PATIENT: 37 minutes.    Note: This dictation was prepared with Dragon dictation along with smaller phrase technology. Any transcriptional errors that result from this process are unintentional.  Juandaniel Manfredo M.D on 01/19/2017 at 9:30 AM  Between 7am to 6pm - Pager - [phone] After 6pm go to [email or web address] - password [email or web address] [facility]  Office  [phone]  CC: Primary care physician; Lelon Huh, MD

## 2017-01-19 NOTE — Progress Notes (Signed)
Pt to be discharged today. Iv and tele removed. disch istructions given to pt to his understanding. disch via w.c. Accompanied by family

## 2017-01-19 NOTE — Discharge Instructions (Signed)
Heart Failure Clinic appointment on January 26, 2017 at 12:00pm with Darylene Price, [place]. Please call [phone] to reschedule.

## 2017-01-19 NOTE — Telephone Encounter (Signed)
Patient is been discharged today for CHF. Appointment made to see Dr Caryn Section on 01/28/17 for follow up-aa

## 2017-01-19 NOTE — Progress Notes (Signed)
Initial HF Clinic appointment scheduled for January 26, 2017 at 12:00 noon. Thank you.

## 2017-01-20 NOTE — Telephone Encounter (Signed)
Transition Care Management Follow-up Telephone Call    Date discharged? 01/19/17  How have you been since you were released from the hospital? Still coughing, wheezing some, and has shortness of breath. Pt declines sputum, fever, nausea, vomiting and diarrhea.   Any patient concerns? Wheezing, turning into something else   Items Reviewed:  Medications reviewed: Yes  Allergies reviewed: Yes  Dietary changes reviewed: heart healthy diet  Referrals reviewed: Yes- Dr. Clayborn Bigness on 01/28/17 and Darylene Price 01/26/17.   Functional Questionnaire:  Independent - I Dependent - D    Activities of Daily Living (ADLs):    Personal hygiene - I Dressing - I Eating - I Maintaining continence - I Transferring - I   Independent Activities of Daily Living (iADLs): Basic communication skills - I Transportation - I Meal preparation - I Shopping - I Housework - I Managing medications - I  Managing personal finances - I   Confirmed importance and date/time of follow-up visits scheduled YES  Provider Appointment booked with PCP 01/28/17 @ 2 PM.  Confirmed with patient if condition begins to worsen call PCP or go to the ER.  Patient was given the office number and encouraged to call back with question or concerns: YES

## 2017-01-21 NOTE — Progress Notes (Deleted)
Chris Nguyen  [phone] [phone]  ID: Chris Nguyen OB: [birth year]  MR#: [id number]  [id number]  Patient Care Team: Birdie Sons, MD as PCP - General (Family Medicine)  CHIEF COMPLAINT: ***  INTERVAL HISTORY: ***  REVIEW OF SYSTEMS:   ROS  As per HPI. Otherwise, a complete review of systems is negative.  PAST MEDICAL HISTORY: Past Medical History:  Diagnosis Date  . AICD (automatic cardioverter/defibrillator) present   . BPH (benign prostatic hyperplasia)   . CAD (coronary artery disease) 05/14/2006  . Cardiac arrest ([place]) 11/12/2013   Overview:  December 2014: ICD implanted  Last Assessment & Plan:  Relevant Hx: Course: Daily Update: Today's Plan:   . Chronic systolic CHF (congestive heart failure) ([place])   . Erectile dysfunction   . H/O coronary artery bypass surgery 05/17/2015   Overview:  LIMA to LAD   . HLD (hyperlipidemia)   . Osteoarthritis of left wrist   . Paroxysmal atrial fibrillation ([place])     PAST SURGICAL HISTORY: Past Surgical History:  Procedure Laterality Date  . CARDIAC CATHETERIZATION  06/27/2008  . CARPAL TUNNEL RELEASE    . CORONARY ARTERY BYPASS GRAFT  2006 and 2014   LIMA to LAD at [facility] 2014  . MITRAL VALVE REPAIR      FAMILY HISTORY: Family History  Problem Relation Age of Onset  . CAD Mother 21  . Diabetes Mother     Diabetes Mellitus  . Dementia Father   . Glaucoma Father   . Heart disease      ADVANCED DIRECTIVES (Y/N):  N  HEALTH MAINTENANCE: Social History  Substance Use Topics  . Smoking status: Former Smoker    Quit date: 12/01/2004  . Smokeless tobacco: Never Used  . Alcohol use 4.2 - 5.4 oz/week    1 Glasses of wine, 6 - 8 Cans of beer per week     Colonoscopy:  PAP:  Bone density:  Lipid panel:  Allergies  Allergen Reactions  . Spironolactone Other (See Comments)    Hyperkalemia  . Ziac [Bisoprolol-Hydrochlorothiazide]     Current Outpatient Prescriptions    Medication Sig Dispense Refill  . albuterol (PROVENTIL HFA;VENTOLIN HFA) 108 (90 Base) MCG/ACT inhaler Inhale 2 puffs into the lungs every 6 (six) hours as needed for wheezing or shortness of breath. 1 Inhaler 0  . amiodarone (PACERONE) 200 MG tablet TAKE 1 TABLET DAILY FOR ATRIAL FIBRILLATION 90 tablet 3  . aspirin 81 MG chewable tablet Chew by mouth.    Marland Kitchen atorvastatin (LIPITOR) 80 MG tablet TAKE 1 TABLET EVERY DAY 90 tablet 3  . fluticasone (FLONASE) 50 MCG/ACT nasal spray Place 2 sprays into both nostrils daily.    . folic acid (FOLVITE) 1 MG tablet TAKE ONE TABLET BY MOUTH ONCE DAILY 30 tablet 12  . furosemide (LASIX) 40 MG tablet Take 1 tablet (40 mg total) by mouth daily. 30 tablet 0  . lisinopril (PRINIVIL,ZESTRIL) 20 MG tablet Take 1 tablet (20 mg total) by mouth daily. 90 tablet 4  . loratadine (CLARITIN) 10 MG tablet Take 10 mg by mouth daily.    . metoprolol succinate (TOPROL-XL) 50 MG 24 hr tablet Take 1 tablet (50 mg total) by mouth daily. 90 tablet 3  . Multiple Vitamins-Minerals (CENTRUM SILVER ADULT 50+) TABS Take 1 tablet by mouth daily.    . sodium chloride (OCEAN) 0.65 % SOLN nasal spray Place 1 spray into both nostrils as needed for congestion.    . tadalafil (CIALIS) 5  MG tablet Take 5 mg by mouth daily as needed for erectile dysfunction.    . TraMADol HCl 50 MG TBDP Take 50 mg by mouth 2 (two) times daily as needed.     No current facility-administered medications for this visit.     OBJECTIVE: There were no vitals filed for this visit.   There is no height or weight on file to calculate BMI.    ECOG FS:{CHL ONC [id number]  General: Well-developed, well-nourished, no acute distress. Eyes: Pink conjunctiva, anicteric sclera. HEENT: Normocephalic, moist mucous membranes, clear oropharnyx. Lungs: Clear to auscultation bilaterally. Heart: Regular rate and rhythm. No rubs, murmurs, or gallops. Abdomen: Soft, nontender, nondistended. No organomegaly noted, normoactive  bowel sounds. Musculoskeletal: No edema, cyanosis, or clubbing. Neuro: Alert, answering all questions appropriately. Cranial nerves grossly intact. Skin: No rashes or petechiae noted. Psych: Normal affect. Lymphatics: No cervical, calvicular, axillary or inguinal LAD.   LAB RESULTS:  Lab Results  Component Value Date   NA 139 01/19/2017   K 3.4 (L) 01/19/2017   CL 100 (L) 01/19/2017   CO2 31 01/19/2017   GLUCOSE 91 01/19/2017   BUN 27 (H) 01/19/2017   CREATININE 1.80 (H) 01/19/2017   CALCIUM 9.3 01/19/2017   PROT 7.8 12/19/2016   ALBUMIN 4.6 12/19/2016   AST 17 12/19/2016   ALT 10 12/19/2016   ALKPHOS 119 (H) 12/19/2016   BILITOT 0.6 12/19/2016   GFRNONAA 38 (L) 01/19/2017   GFRAA 44 (L) 01/19/2017    Lab Results  Component Value Date   WBC 6.7 01/17/2017   NEUTROABS 5.9 02/24/2016   HGB 16.4 01/17/2017   HCT 48.9 01/17/2017   MCV 89.5 01/17/2017   PLT 196 01/17/2017     STUDIES: Dg Chest 2 View  Result Date: 01/17/2017 CLINICAL DATA:  Shortness of Breath EXAM: CHEST  2 VIEW COMPARISON:  January 16, 2017 FINDINGS: There is mild bibasilar atelectasis, stable. There are small pleural effusions bilaterally. Lungs elsewhere are clear. Heart remains enlarged with pulmonary vascular within normal limits. There is atherosclerotic calcification in the aorta. Pacemaker lead is in the right ventricular area, although the tip of the pacemaker the deviates posteriorly, unchanged. No adenopathy. No evident bone lesions. IMPRESSION: Stable pacemaker position. Stable cardiomegaly. Aortic atherosclerosis. Stable mild bibasilar atelectasis with small pleural effusions. No new opacity. Electronically Signed   By: Lowella Grip III M.D.   On: 01/17/2017 11:19   Dg Chest 2 View  Result Date: 01/16/2017 CLINICAL DATA:  Shortness of breath.  Fatigue for 2 months. EXAM: CHEST  2 VIEW COMPARISON:  12/19/2016 FINDINGS: Small bilateral pleural effusion. Chronic cardiomegaly. Status post  CABG and right ventricular ICD/ pacer implant. No pulmonary edema or air bronchogram. Borderline hyperinflation. No pneumothorax. IMPRESSION: Cardiomegaly and small bilateral pleural effusion. No pulmonary edema. Borderline hyperinflation.  Correlate for COPD symptoms. Electronically Signed   By: Monte Fantasia M.D.   On: 01/16/2017 11:02    ASSESSMENT:   PLAN:    Patient expressed understanding and was in agreement with this plan. He also understands that He can call clinic at any time with any questions, concerns, or complaints.   Cancer Staging No matching staging information was found for the patient.  Lloyd Huger, MD   01/21/2017 10:45 PM

## 2017-01-22 ENCOUNTER — Inpatient Hospital Stay: Payer: Medicare HMO | Attending: Oncology | Admitting: Oncology

## 2017-01-26 ENCOUNTER — Ambulatory Visit: Payer: Self-pay | Admitting: Family

## 2017-01-28 ENCOUNTER — Inpatient Hospital Stay: Payer: Self-pay | Admitting: Family Medicine

## 2017-02-06 ENCOUNTER — Telehealth: Payer: Self-pay | Admitting: Family Medicine

## 2017-02-06 ENCOUNTER — Ambulatory Visit: Payer: Self-pay | Admitting: Oncology

## 2017-02-06 NOTE — Telephone Encounter (Signed)
Called Pt to schedule AWV with [facility] - knb °

## 2017-02-16 ENCOUNTER — Ambulatory Visit
Admission: RE | Admit: 2017-02-16 | Discharge: 2017-02-16 | Disposition: A | Discharge status: Home / Self Care | Payer: Medicare HMO | Source: Ambulatory Visit | Attending: Family Medicine | Admitting: Family Medicine

## 2017-02-16 ENCOUNTER — Encounter: Payer: Self-pay | Admitting: Family Medicine

## 2017-02-16 ENCOUNTER — Ambulatory Visit (INDEPENDENT_AMBULATORY_CARE_PROVIDER_SITE_OTHER): Payer: Medicare HMO | Admitting: Family Medicine

## 2017-02-16 VITALS — BP 126/82 | HR 48 | Temp 97.9°F | Resp 24 | Wt 187.0 lb

## 2017-02-16 DIAGNOSIS — I5021 Acute systolic (congestive) heart failure: Secondary | ICD-10-CM | POA: Insufficient documentation

## 2017-02-16 DIAGNOSIS — R0609 Other forms of dyspnea: Secondary | ICD-10-CM | POA: Diagnosis not present

## 2017-02-16 DIAGNOSIS — R0602 Shortness of breath: Secondary | ICD-10-CM | POA: Diagnosis not present

## 2017-02-16 DIAGNOSIS — J90 Pleural effusion, not elsewhere classified: Secondary | ICD-10-CM | POA: Insufficient documentation

## 2017-02-16 DIAGNOSIS — R918 Other nonspecific abnormal finding of lung field: Secondary | ICD-10-CM | POA: Diagnosis not present

## 2017-02-16 DIAGNOSIS — I700 Atherosclerosis of aorta: Secondary | ICD-10-CM | POA: Insufficient documentation

## 2017-02-16 DIAGNOSIS — R05 Cough: Secondary | ICD-10-CM | POA: Diagnosis not present

## 2017-02-16 NOTE — Patient Instructions (Signed)
Go to the [facility] on [address] for chest Xray  

## 2017-02-16 NOTE — Progress Notes (Signed)
Patient: Chris Nguyen Male    DOB: 1951-08-21   66 y.o.   MRN: [id number] Visit Date: 02/16/2017  Today's Provider: Lelon Huh, MD   Chief Complaint  Patient presents with  . Insomnia   Subjective:    Insomnia  Primary symptoms: difficulty falling asleep.  The current episode started one month. The onset quality is gradual. The problem occurs every several days. The problem has been gradually worsening since onset.   Patient feels that his shortness of breath and wheezing is making it difficult for him to sleep. Patient reports that he no longer uses an inhaler to help with wheezing because he ran out.   Wt Readings from Last 3 Encounters:  02/16/17 187 lb (84.8 kg)  01/19/17 180 lb 14.4 oz (82.1 kg)  12/19/16 189 lb (85.7 kg)       Allergies  Allergen Reactions  . Spironolactone Other (See Comments)    Hyperkalemia  . Ziac [Bisoprolol-Hydrochlorothiazide]      Current Outpatient Prescriptions:  .  amiodarone (PACERONE) 200 MG tablet, TAKE 1 TABLET DAILY FOR ATRIAL FIBRILLATION, Disp: 90 tablet, Rfl: 3 .  aspirin 81 MG chewable tablet, Chew by mouth., Disp: , Rfl:  .  atorvastatin (LIPITOR) 80 MG tablet, TAKE 1 TABLET EVERY DAY, Disp: 90 tablet, Rfl: 3 .  fluticasone (FLONASE) 50 MCG/ACT nasal spray, Place 2 sprays into both nostrils daily., Disp: , Rfl:  .  folic acid (FOLVITE) 1 MG tablet, TAKE ONE TABLET BY MOUTH ONCE DAILY, Disp: 30 tablet, Rfl: 12 .  furosemide (LASIX) 40 MG tablet, Take 1 tablet (40 mg total) by mouth daily., Disp: 30 tablet, Rfl: 0 .  lisinopril (PRINIVIL,ZESTRIL) 20 MG tablet, Take 1 tablet (20 mg total) by mouth daily., Disp: 90 tablet, Rfl: 4 .  loratadine (CLARITIN) 10 MG tablet, Take 10 mg by mouth daily., Disp: , Rfl:  .  metoprolol succinate (TOPROL-XL) 50 MG 24 hr tablet, Take 1 tablet (50 mg total) by mouth daily., Disp: 90 tablet, Rfl: 3 .  Multiple Vitamins-Minerals (CENTRUM SILVER ADULT 50+) TABS, Take 1 tablet by mouth  daily., Disp: , Rfl:  .  sodium chloride (OCEAN) 0.65 % SOLN nasal spray, Place 1 spray into both nostrils as needed for congestion., Disp: , Rfl:  .  tadalafil (CIALIS) 5 MG tablet, Take 5 mg by mouth daily as needed for erectile dysfunction., Disp: , Rfl:  .  TraMADol HCl 50 MG TBDP, Take 50 mg by mouth 2 (two) times daily as needed., Disp: , Rfl:  .  albuterol (PROVENTIL HFA;VENTOLIN HFA) 108 (90 Base) MCG/ACT inhaler, Inhale 2 puffs into the lungs every 6 (six) hours as needed for wheezing or shortness of breath. (Patient not taking: Reported on 02/16/2017), Disp: 1 Inhaler, Rfl: 0  Review of Systems  Constitutional: Positive for fatigue.  Respiratory: Positive for shortness of breath and wheezing.   Cardiovascular: Negative for chest pain, palpitations and leg swelling.  Psychiatric/Behavioral: The patient has insomnia.    Patient Active Problem List   Diagnosis Date Noted  . HLD (hyperlipidemia) 02/25/2016  . Acute on chronic systolic CHF (congestive heart failure) ([place]) 02/24/2016  . Chronic kidney disease (CKD), stage III (moderate) 05/17/2015  . Acid reflux 05/17/2015  . Heart failure, systolic ([place]) 40/98/1191  . Hyperlipidemia   . Hypertension   . Hypertensive cardiomyopathy ([place])   . Cardiomyopathy ([place])   . Foot pain, left   . BPH (benign prostatic hyperplasia)   . Erectile  dysfunction   . Left leg weakness   . Encounter for adjustment or management of automatic implantable cardioverter-defibrillator 02/07/2014  . Paroxysmal a-fib ([place]) 12/26/2013  . Automatic implantable cardioverter-defibrillator in situ 12/15/2013  . Osteoarthritis of left wrist 09/27/2010  . CAD (coronary artery disease) 05/14/2006    Social History  Substance Use Topics  . Smoking status: Former Smoker    Quit date: 12/01/2004  . Smokeless tobacco: Never Used  . Alcohol use 4.2 - 5.4 oz/week    1 Glasses of wine, 6 - 8 Cans of beer per week   Objective:   BP 126/82 (BP Location: Left Arm,  Patient Position: Sitting, Cuff Size: Normal)   Pulse (!) 48   Temp 97.9 F (36.6 C)   Resp (!) 24   Wt 187 lb (84.8 kg)   SpO2 97%   BMI 28.02 kg/m     Physical Exam   General Appearance:    Alert, cooperative, no distress  Eyes:    PERRL, conjunctiva/corneas clear, EOM's intact       Lungs:     Faint bibasilar wet rales  Heart:    Regular rate and rhythm  Neurologic:   Awake, alert, oriented x 3. No apparent focal neurological           defect.           Assessment & Plan:     1. Dyspnea on exertion Likely CHF exacerbation. Consider Entresto 49/51 BID for 2 weeks after reviewing labs and x-rays.  - CBC - Basic metabolic panel - Brain natriuretic peptide - DG Chest 2 View; Future  2. Acute systolic heart failure (HCC)  - CBC - Basic metabolic panel - Brain natriuretic peptide - DG Chest 2 View; Future       Lelon Huh, MD  [facility]

## 2017-02-17 LAB — CBC
HEMATOCRIT: 50.2 % (ref 37.5–51.0)
Hemoglobin: 16.5 g/dL (ref 13.0–17.7)
MCH: 29.4 pg (ref 26.6–33.0)
MCHC: 32.9 g/dL (ref 31.5–35.7)
MCV: 89 fL (ref 79–97)
PLATELETS: 205 10*3/uL (ref 150–379)
RBC: 5.62 x10E6/uL (ref 4.14–5.80)
RDW: 14.2 % (ref 12.3–15.4)
WBC: 5.2 10*3/uL (ref 3.4–10.8)

## 2017-02-17 LAB — BRAIN NATRIURETIC PEPTIDE: BNP: 523.7 pg/mL — ABNORMAL HIGH (ref 0.0–100.0)

## 2017-02-17 LAB — BASIC METABOLIC PANEL
BUN/Creatinine Ratio: 11 (ref 10–24)
BUN: 21 mg/dL (ref 8–27)
CO2: 29 mmol/L (ref 18–29)
Calcium: 9.6 mg/dL (ref 8.6–10.2)
Chloride: 98 mmol/L (ref 96–106)
Creatinine, Ser: 1.84 mg/dL — ABNORMAL HIGH (ref 0.76–1.27)
GFR, EST AFRICAN AMERICAN: 43 mL/min/{1.73_m2} — AB (ref >59)
GFR, EST NON AFRICAN AMERICAN: 37 mL/min/{1.73_m2} — AB (ref >59)
Glucose: 92 mg/dL (ref 65–99)
POTASSIUM: 4.4 mmol/L (ref 3.5–5.2)
Sodium: 144 mmol/L (ref 134–144)

## 2017-02-20 ENCOUNTER — Telehealth: Payer: Self-pay | Admitting: Family Medicine

## 2017-02-20 NOTE — Telephone Encounter (Signed)
Chris Nguyen is better for his heart, but if not tolerating can change back to lisinopril 20mg  a day.

## 2017-02-20 NOTE — Telephone Encounter (Signed)
[id number] 

## 2017-02-20 NOTE — Telephone Encounter (Signed)
Told the patient he could go back to the Lisnopril.  He has some on hand and will call for a refill as needed.  Thanks [employer]

## 2017-02-20 NOTE — Telephone Encounter (Signed)
Please advise 

## 2017-02-20 NOTE — Telephone Encounter (Signed)
Pt called saying the samples of blood pressure medication that you gave him caused him to have incontinence and diarrhea.  He wants to take the lisinopril.  He does not want to take the new medication  His call back is  [phone]

## 2017-02-27 ENCOUNTER — Telehealth: Payer: Self-pay | Admitting: Family Medicine

## 2017-02-27 NOTE — Telephone Encounter (Signed)
Called Pt to schedule AWV with [facility] - knb °

## 2017-03-17 ENCOUNTER — Encounter: Payer: Self-pay | Admitting: Family Medicine

## 2017-03-17 ENCOUNTER — Ambulatory Visit (INDEPENDENT_AMBULATORY_CARE_PROVIDER_SITE_OTHER): Payer: Medicare HMO | Admitting: Family Medicine

## 2017-03-17 VITALS — BP 122/74 | HR 68 | Temp 98.7°F | Resp 16 | Wt 181.0 lb

## 2017-03-17 DIAGNOSIS — R05 Cough: Secondary | ICD-10-CM | POA: Diagnosis not present

## 2017-03-17 DIAGNOSIS — J40 Bronchitis, not specified as acute or chronic: Secondary | ICD-10-CM

## 2017-03-17 DIAGNOSIS — R059 Cough, unspecified: Secondary | ICD-10-CM

## 2017-03-17 MED ORDER — DOXYCYCLINE MONOHYDRATE 100 MG PO TABS: 100.0000 mg | ORAL_TABLET | Freq: Two times a day (BID) | ORAL | 0 refills | Status: AC

## 2017-03-17 NOTE — Progress Notes (Signed)
Patient: Chris Nguyen Male    DOB: [birth year]   66 y.o.   MRN: [id number] Visit Date: 03/17/2017  Today's Provider: Lelon Huh, MD   Chief Complaint  Patient presents with  . Cough   Subjective:    Cough  This is a new problem. The current episode started 1 to 4 weeks ago (2 weeks). The problem has been gradually worsening. The cough is productive of sputum (white in color). Associated symptoms include headaches and postnasal drip. Pertinent negatives include no shortness of breath or wheezing. Nothing aggravates the symptoms. He has tried OTC cough suppressant (has also tried mucinex ) for the symptoms. The treatment provided mild relief. His past medical history is significant for environmental allergies.       Allergies  Allergen Reactions  . Spironolactone Other (See Comments)    Hyperkalemia  . Entresto [Sacubitril-Valsartan] Diarrhea  . Ziac [Bisoprolol-Hydrochlorothiazide]      Current Outpatient Prescriptions:  .  amiodarone (PACERONE) 200 MG tablet, TAKE 1 TABLET DAILY FOR ATRIAL FIBRILLATION, Disp: 90 tablet, Rfl: 3 .  aspirin 81 MG chewable tablet, Chew by mouth., Disp: , Rfl:  .  atorvastatin (LIPITOR) 80 MG tablet, TAKE 1 TABLET EVERY DAY, Disp: 90 tablet, Rfl: 3 .  fluticasone (FLONASE) 50 MCG/ACT nasal spray, Place 2 sprays into both nostrils daily., Disp: , Rfl:  .  folic acid (FOLVITE) 1 MG tablet, TAKE ONE TABLET BY MOUTH ONCE DAILY, Disp: 30 tablet, Rfl: 12 .  furosemide (LASIX) 40 MG tablet, Take 1 tablet (40 mg total) by mouth daily., Disp: 30 tablet, Rfl: 0 .  lisinopril (PRINIVIL,ZESTRIL) 20 MG tablet, Take 1 tablet (20 mg total) by mouth daily., Disp: 90 tablet, Rfl: 4 .  loratadine (CLARITIN) 10 MG tablet, Take 10 mg by mouth daily., Disp: , Rfl:  .  metoprolol succinate (TOPROL-XL) 50 MG 24 hr tablet, Take 1 tablet (50 mg total) by mouth daily., Disp: 90 tablet, Rfl: 3 .  Multiple Vitamins-Minerals (CENTRUM SILVER ADULT 50+) TABS, Take 1  tablet by mouth daily., Disp: , Rfl:  .  sodium chloride (OCEAN) 0.65 % SOLN nasal spray, Place 1 spray into both nostrils as needed for congestion., Disp: , Rfl:  .  tadalafil (CIALIS) 5 MG tablet, Take 5 mg by mouth daily as needed for erectile dysfunction., Disp: , Rfl:  .  TraMADol HCl 50 MG TBDP, Take 50 mg by mouth 2 (two) times daily as needed., Disp: , Rfl:  .  albuterol (PROVENTIL HFA;VENTOLIN HFA) 108 (90 Base) MCG/ACT inhaler, Inhale 2 puffs into the lungs every 6 (six) hours as needed for wheezing or shortness of breath. (Patient not taking: Reported on 02/16/2017), Disp: 1 Inhaler, Rfl: 0  Review of Systems  HENT: Positive for postnasal drip.   Respiratory: Positive for cough. Negative for shortness of breath and wheezing.   Allergic/Immunologic: Positive for environmental allergies.  Neurological: Positive for headaches.    Social History  Substance Use Topics  . Smoking status: Former Smoker    Quit date: 12/01/2004  . Smokeless tobacco: Never Used  . Alcohol use 4.2 - 5.4 oz/week    1 Glasses of wine, 6 - 8 Cans of beer per week   Objective:   BP 122/74 (BP Location: Left Arm, Patient Position: Sitting, Cuff Size: Normal)   Pulse 68   Temp 98.7 F (37.1 C)   Resp 16   Wt 181 lb (82.1 kg)   SpO2 98%   BMI 27.12  kg/m  Vitals:   03/17/17 1024  BP: 122/74  Pulse: 68  Resp: 16  Temp: 98.7 F (37.1 C)  SpO2: 98%  Weight: 181 lb (82.1 kg)     Physical Exam  General Appearance:    Alert, cooperative, no distress  HENT:   bilateral TM normal without fluid or infection, neck without nodes, throat normal without erythema or exudate, sinuses nontender and nasal mucosa pale and congested  Eyes:    PERRL, conjunctiva/corneas clear, EOM's intact       Lungs:     Occasional expiratory wheeze, no rales, , respirations unlabored  Heart:    Regular rate and rhythm  Neurologic:   Awake, alert, oriented x 3. No apparent focal neurological           defect.             Assessment & Plan:     1. Bronchitis  - doxycycline (ADOXA) 100 MG tablet; Take 1 tablet (100 mg total) by mouth 2 (two) times daily.  Dispense: 20 tablet; Refill: 0  2. Cough  Call if symptoms change or if not rapidly improving.         The entirety of the information documented in the History of Present Illness, Review of Systems and Physical Exam were personally obtained by me. Portions of this information were initially documented by Wilburt Finlay, CMA and reviewed by me for thoroughness and accuracy.    Lelon Huh, MD  [facility]

## 2017-03-17 NOTE — Patient Instructions (Signed)

## 2017-04-01 ENCOUNTER — Other Ambulatory Visit: Payer: Self-pay | Admitting: Family Medicine

## 2017-04-01 DIAGNOSIS — I110 Hypertensive heart disease with heart failure: Secondary | ICD-10-CM

## 2017-04-01 DIAGNOSIS — I5023 Acute on chronic systolic (congestive) heart failure: Secondary | ICD-10-CM

## 2017-04-01 DIAGNOSIS — I429 Cardiomyopathy, unspecified: Secondary | ICD-10-CM

## 2017-04-01 DIAGNOSIS — I43 Cardiomyopathy in diseases classified elsewhere: Secondary | ICD-10-CM

## 2017-04-30 ENCOUNTER — Telehealth: Payer: Self-pay | Admitting: Family Medicine

## 2017-05-14 ENCOUNTER — Telehealth: Payer: Self-pay | Admitting: Family Medicine

## 2017-06-04 ENCOUNTER — Telehealth: Payer: Self-pay | Admitting: Family Medicine

## 2017-11-05 ENCOUNTER — Ambulatory Visit: Payer: Medicare HMO | Admitting: Family Medicine

## 2017-11-05 ENCOUNTER — Encounter: Payer: Self-pay | Admitting: Family Medicine

## 2017-11-05 VITALS — BP 130/90 | HR 59 | Temp 97.8°F | Resp 16 | Wt 184.0 lb

## 2017-11-05 DIAGNOSIS — J309 Allergic rhinitis, unspecified: Secondary | ICD-10-CM | POA: Diagnosis not present

## 2017-11-05 DIAGNOSIS — R5383 Other fatigue: Secondary | ICD-10-CM

## 2017-11-05 DIAGNOSIS — I5021 Acute systolic (congestive) heart failure: Secondary | ICD-10-CM

## 2017-11-05 DIAGNOSIS — I10 Essential (primary) hypertension: Secondary | ICD-10-CM | POA: Diagnosis not present

## 2017-11-05 DIAGNOSIS — R0609 Other forms of dyspnea: Secondary | ICD-10-CM

## 2017-11-05 MED ORDER — MONTELUKAST SODIUM 10 MG PO TABS: 10.0000 mg | ORAL_TABLET | Freq: Every day | ORAL | 3 refills | Status: DC

## 2017-11-05 NOTE — Progress Notes (Signed)
Patient: Chris Nguyen Male    DOB: October 27, 1951   66 y.o.   MRN: [id number] Visit Date: 11/05/2017  Today's Provider: Lelon Huh, MD   Chief Complaint  Patient presents with  . Weight Gain   Subjective:    HPI  Weight gain:  Patient presents today reporting that he has gained several pounds over the past couple of days. He usually weighs around 178lbs, but today he is weighing 184lbs. Patient denies any swelling or dizziness. He has a history of CHF and has been short of breath. Patient states since he has noticed the weight gain, he has not been able to sleep on his back due to the shortness of breath. Has been more fatigued the last couple of months off and on. He gets short of breath with minimal exertion and when lying down. He has also been participating in drug study by cardiologist and is apparently taking Jardiance 10 for several months. He has no history of diabetes, so I presume this is a CHF outcomes study for non-diabetics. He states he has follow up regarding study next week He also reports that he stopped atorvastatin several months ago due to size of pill.   Wt Readings from Last 3 Encounters:  11/05/17 184 lb (83.5 kg)  03/17/17 181 lb (82.1 kg)  02/16/17 187 lb (84.8 kg)    He also complains of persistent nasal drainage and congestion in ears, left more than right for several months. He states he had evaluation by ENT and was advised to take loratadine by his cardiologist, but symptoms have not improved.     Allergies  Allergen Reactions  . Spironolactone Other (See Comments)    Hyperkalemia  . Entresto [Sacubitril-Valsartan] Diarrhea  . Ziac [Bisoprolol-Hydrochlorothiazide]      Current Outpatient Medications:  .  amiodarone (PACERONE) 200 MG tablet, TAKE 1 TABLET DAILY FOR ATRIAL FIBRILLATION, Disp: 90 tablet, Rfl: 3 .  aspirin 81 MG chewable tablet, Chew by mouth., Disp: , Rfl:  .  empagliflozin (JARDIANCE) 10 MG TABS tablet, Take 10 mg by mouth  daily., Disp: , Rfl:  .  furosemide (LASIX) 40 MG tablet, Take 1 tablet (40 mg total) by mouth daily., Disp: 30 tablet, Rfl: 0 .  lisinopril (PRINIVIL,ZESTRIL) 20 MG tablet, TAKE 1 TABLET EVERY DAY, Disp: 90 tablet, Rfl: 4 .  loratadine (CLARITIN) 10 MG tablet, Take 10 mg by mouth daily., Disp: , Rfl:  .  metoprolol succinate (TOPROL-XL) 50 MG 24 hr tablet, Take 1 tablet (50 mg total) by mouth daily., Disp: 90 tablet, Rfl: 3 .  Multiple Vitamins-Minerals (CENTRUM SILVER ADULT 50+) TABS, Take 1 tablet by mouth daily., Disp: , Rfl:  .  tadalafil (CIALIS) 5 MG tablet, Take 5 mg by mouth daily as needed for erectile dysfunction., Disp: , Rfl:  .  albuterol (PROVENTIL HFA;VENTOLIN HFA) 108 (90 Base) MCG/ACT inhaler, Inhale 2 puffs into the lungs every 6 (six) hours as needed for wheezing or shortness of breath. (Patient not taking: Reported on 02/16/2017), Disp: 1 Inhaler, Rfl: 0 .  atorvastatin (LIPITOR) 80 MG tablet, TAKE 1 TABLET EVERY DAY (Patient not taking: Reported on 11/05/2017), Disp: 90 tablet, Rfl: 3 .  fluticasone (FLONASE) 50 MCG/ACT nasal spray, Place 2 sprays into both nostrils daily., Disp: , Rfl:  .  folic acid (FOLVITE) 1 MG tablet, TAKE ONE TABLET BY MOUTH ONCE DAILY, Disp: 30 tablet, Rfl: 12 .  sodium chloride (OCEAN) 0.65 % SOLN nasal spray, Place 1  spray into both nostrils as needed for congestion., Disp: , Rfl:  .  TraMADol HCl 50 MG TBDP, Take 50 mg by mouth 2 (two) times daily as needed., Disp: , Rfl:   Review of Systems  Constitutional: Positive for fatigue and unexpected weight change. Negative for appetite change, chills and fever.  Respiratory: Positive for shortness of breath. Negative for chest tightness and wheezing.   Cardiovascular: Negative for chest pain and palpitations.  Gastrointestinal: Negative for abdominal pain, nausea and vomiting.  Psychiatric/Behavioral: Positive for sleep disturbance.    Social History   Tobacco Use  . Smoking status: Former Smoker     Last attempt to quit: 12/01/2004    Years since quitting: 12.9  . Smokeless tobacco: Never Used  Substance Use Topics  . Alcohol use: Yes    Alcohol/week: 4.2 - 5.4 oz    Types: 1 Glasses of wine, 6 - 8 Cans of beer per week   Objective:   BP 130/90 (BP Location: Right Arm, Patient Position: Sitting, Cuff Size: Large)   Pulse (!) 59   Temp 97.8 F (36.6 C) (Oral)   Resp 16   Wt 184 lb (83.5 kg)   SpO2 99% Comment: room air  BMI 27.57 kg/m     Physical Exam      General Appearance:    Alert, cooperative, no distress  HENT:   bilateral TM normal without fluid or infection, neck without nodes, post nasal drip noted and nasal mucosa pale and congested  Eyes:    PERRL, conjunctiva/corneas clear, EOM's intact       Lungs:     Clear to auscultation bilaterally, respirations unlabored  Heart:    Regular rate and rhythm  Neurologic:   Awake, alert, oriented x 3. No apparent focal neurological           defect.       EKG. NSR, no acute change compared to EKG of January 2018.  Assessment & Plan:     1. Essential hypertension  - EKG 12-Lead - COMPLETE METABOLIC PANEL WITH GFR - CBC - TSH - Brain natriuretic peptide - Troponin I  2. Dyspnea on exertion Likely subacute CHF exacerbation. He can double furosemide today and see how labs look.  He does have follow up regarding CHF study next week. May need early follow up with cardiologist.  - COMPLETE METABOLIC PANEL WITH GFR - CBC - TSH - Brain natriuretic peptide - Troponin I  3. Other fatigue  - COMPLETE METABOLIC PANEL WITH GFR - CBC - TSH - Brain natriuretic peptide - Troponin I  4. Acute systolic heart failure (HCC)  - COMPLETE METABOLIC PANEL WITH GFR - CBC - TSH - Brain natriuretic peptide - Troponin I  5. Allergic rhinitis, unspecified seasonality, unspecified trigger Failing loratadine.  Send prescription for Singulair to try in its place.          Lelon Huh, MD  [facility]

## 2017-11-05 NOTE — Patient Instructions (Signed)
   Take an extra dose of furosemide today

## 2017-11-06 ENCOUNTER — Telehealth: Payer: Self-pay

## 2017-11-06 LAB — COMPLETE METABOLIC PANEL WITH GFR
AG RATIO: 1.1 (calc) (ref 1.0–2.5)
ALKALINE PHOSPHATASE (APISO): 131 U/L — AB (ref 40–115)
ALT: 21 U/L (ref 9–46)
AST: 23 U/L (ref 10–35)
Albumin: 4.1 g/dL (ref 3.6–5.1)
BUN/Creatinine Ratio: 10 (calc) (ref 6–22)
BUN: 21 mg/dL (ref 7–25)
CO2: 29 mmol/L (ref 20–32)
Calcium: 9.6 mg/dL (ref 8.6–10.3)
Chloride: 97 mmol/L — ABNORMAL LOW (ref 98–110)
Creat: 2.04 mg/dL — ABNORMAL HIGH (ref 0.70–1.25)
GFR, Est African American: 38 mL/min/{1.73_m2} — ABNORMAL LOW (ref >=60)
GFR, Est Non African American: 33 mL/min/{1.73_m2} — ABNORMAL LOW (ref >=60)
GLOBULIN: 3.6 g/dL (ref 1.9–3.7)
Glucose, Bld: 78 mg/dL (ref 65–99)
POTASSIUM: 3.6 mmol/L (ref 3.5–5.3)
SODIUM: 140 mmol/L (ref 135–146)
Total Bilirubin: 1 mg/dL (ref 0.2–1.2)
Total Protein: 7.7 g/dL (ref 6.1–8.1)

## 2017-11-06 LAB — CBC
HCT: 56.3 % — ABNORMAL HIGH (ref 38.5–50.0)
HEMOGLOBIN: 19.4 g/dL — AB (ref 13.2–17.1)
MCH: 31.1 pg (ref 27.0–33.0)
MCHC: 34.5 g/dL (ref 32.0–36.0)
MCV: 90.4 fL (ref 80.0–100.0)
MPV: 10.3 fL (ref 7.5–12.5)
PLATELETS: 181 10*3/uL (ref 140–400)
RBC: 6.23 10*6/uL — AB (ref 4.20–5.80)
RDW: 15.3 % — ABNORMAL HIGH (ref 11.0–15.0)
WBC: 6.7 10*3/uL (ref 3.8–10.8)

## 2017-11-06 LAB — BRAIN NATRIURETIC PEPTIDE: BRAIN NATRIURETIC PEPTIDE: 573 pg/mL — AB (ref <100)

## 2017-11-06 LAB — TEST AUTHORIZATION

## 2017-11-06 LAB — TSH: TSH: 32.33 m[IU]/L — AB (ref 0.40–4.50)

## 2017-11-06 LAB — T4: T4, Total: 5.5 ug/dL (ref 4.9–10.5)

## 2017-11-06 LAB — TROPONIN I: Troponin I: 0.1 ng/mL (ref <=0.0)

## 2017-11-06 MED ORDER — LEVOTHYROXINE SODIUM 25 MCG PO TABS: 25.0000 ug | ORAL_TABLET | Freq: Every day | ORAL | 0 refills | Status: DC

## 2017-11-06 NOTE — Telephone Encounter (Signed)
Patient advised. He verbalized understanding. RX sent to [facility]. T4 added to labs.

## 2017-11-06 NOTE — Telephone Encounter (Signed)
-----   Message from Birdie Sons, MD sent at 11/06/2017 11:00 AM EST ----- Labs show is very low  thyroid hormone and CHF is worse. Please add free T4 to labs. Needs to see his cardiologist within a week.  Need to continue 2 furosemide a day until he sees cardiologist.  Need to start levothyroxine 45mcg once tablet daily  #30. Follow up her regarding thyroid in 3 week.

## 2017-11-07 ENCOUNTER — Other Ambulatory Visit: Payer: Self-pay

## 2017-11-07 ENCOUNTER — Emergency Department: Payer: Medicare HMO

## 2017-11-07 ENCOUNTER — Encounter: Payer: Self-pay | Admitting: Emergency Medicine

## 2017-11-07 ENCOUNTER — Emergency Department
Admission: EM | Admit: 2017-11-07 | Discharge: 2017-11-07 | Disposition: A | Discharge status: Home / Self Care | Payer: Medicare HMO | Attending: Emergency Medicine | Admitting: Emergency Medicine

## 2017-11-07 DIAGNOSIS — Z7982 Long term (current) use of aspirin: Secondary | ICD-10-CM | POA: Diagnosis not present

## 2017-11-07 DIAGNOSIS — R0600 Dyspnea, unspecified: Secondary | ICD-10-CM | POA: Diagnosis present

## 2017-11-07 DIAGNOSIS — Z79899 Other long term (current) drug therapy: Secondary | ICD-10-CM | POA: Diagnosis not present

## 2017-11-07 DIAGNOSIS — I2510 Atherosclerotic heart disease of native coronary artery without angina pectoris: Secondary | ICD-10-CM | POA: Diagnosis not present

## 2017-11-07 DIAGNOSIS — Z950 Presence of cardiac pacemaker: Secondary | ICD-10-CM | POA: Insufficient documentation

## 2017-11-07 DIAGNOSIS — I130 Hypertensive heart and chronic kidney disease with heart failure and stage 1 through stage 4 chronic kidney disease, or unspecified chronic kidney disease: Secondary | ICD-10-CM | POA: Insufficient documentation

## 2017-11-07 DIAGNOSIS — I4891 Unspecified atrial fibrillation: Secondary | ICD-10-CM | POA: Insufficient documentation

## 2017-11-07 DIAGNOSIS — N183 Chronic kidney disease, stage 3 (moderate): Secondary | ICD-10-CM | POA: Insufficient documentation

## 2017-11-07 DIAGNOSIS — Z87891 Personal history of nicotine dependence: Secondary | ICD-10-CM | POA: Insufficient documentation

## 2017-11-07 DIAGNOSIS — E785 Hyperlipidemia, unspecified: Secondary | ICD-10-CM | POA: Insufficient documentation

## 2017-11-07 DIAGNOSIS — E8779 Other fluid overload: Secondary | ICD-10-CM | POA: Diagnosis not present

## 2017-11-07 DIAGNOSIS — I509 Heart failure, unspecified: Secondary | ICD-10-CM | POA: Insufficient documentation

## 2017-11-07 DIAGNOSIS — J90 Pleural effusion, not elsewhere classified: Secondary | ICD-10-CM | POA: Diagnosis not present

## 2017-11-07 DIAGNOSIS — R0602 Shortness of breath: Secondary | ICD-10-CM | POA: Diagnosis not present

## 2017-11-07 DIAGNOSIS — I110 Hypertensive heart disease with heart failure: Secondary | ICD-10-CM | POA: Diagnosis not present

## 2017-11-07 LAB — COMPREHENSIVE METABOLIC PANEL
ALK PHOS: 148 U/L — AB (ref 38–126)
ALT: 26 U/L (ref 17–63)
ANION GAP: 10 (ref 5–15)
AST: 35 U/L (ref 15–41)
Albumin: 4 g/dL (ref 3.5–5.0)
BUN: 27 mg/dL — ABNORMAL HIGH (ref 6–20)
CALCIUM: 9.3 mg/dL (ref 8.9–10.3)
CO2: 27 mmol/L (ref 22–32)
Chloride: 99 mmol/L — ABNORMAL LOW (ref 101–111)
Creatinine, Ser: 2.38 mg/dL — ABNORMAL HIGH (ref 0.61–1.24)
GFR calc non Af Amer: 27 mL/min — ABNORMAL LOW (ref >60)
GFR, EST AFRICAN AMERICAN: 31 mL/min — AB (ref >60)
Glucose, Bld: 88 mg/dL (ref 65–99)
POTASSIUM: 3.3 mmol/L — AB (ref 3.5–5.1)
Sodium: 136 mmol/L (ref 135–145)
TOTAL PROTEIN: 8.2 g/dL — AB (ref 6.5–8.1)
Total Bilirubin: 1.1 mg/dL (ref 0.3–1.2)

## 2017-11-07 LAB — CBC WITH DIFFERENTIAL/PLATELET
BASOS ABS: 0.1 10*3/uL (ref 0–0.1)
BASOS PCT: 1 %
Eosinophils Absolute: 0.1 10*3/uL (ref 0–0.7)
Eosinophils Relative: 1 %
HEMATOCRIT: 58.8 % — AB (ref 40.0–52.0)
HEMOGLOBIN: 19.5 g/dL — AB (ref 13.0–18.0)
LYMPHS PCT: 13 %
Lymphs Abs: 0.9 10*3/uL — ABNORMAL LOW (ref 1.0–3.6)
MCH: 31.5 pg (ref 26.0–34.0)
MCHC: 33.1 g/dL (ref 32.0–36.0)
MCV: 95.1 fL (ref 80.0–100.0)
Monocytes Absolute: 0.9 10*3/uL (ref 0.2–1.0)
Monocytes Relative: 14 %
NEUTROS ABS: 4.9 10*3/uL (ref 1.4–6.5)
NEUTROS PCT: 71 %
Platelets: 160 10*3/uL (ref 150–440)
RBC: 6.18 MIL/uL — AB (ref 4.40–5.90)
RDW: 16.1 % — AB (ref 11.5–14.5)
WBC: 6.8 10*3/uL (ref 3.8–10.6)

## 2017-11-07 LAB — TROPONIN I: TROPONIN I: 0.06 ng/mL — AB (ref <0.03)

## 2017-11-07 LAB — BRAIN NATRIURETIC PEPTIDE: B Natriuretic Peptide: 724 pg/mL — ABNORMAL HIGH (ref 0.0–100.0)

## 2017-11-07 MED ORDER — FUROSEMIDE 10 MG/ML IJ SOLN
40.0000 mg | Freq: Once | INTRAMUSCULAR | Status: AC
  Administered 2017-11-07: 40 mg via INTRAVENOUS
  Filled 2017-11-07: qty 4

## 2017-11-07 NOTE — ED Notes (Signed)
Radiology at bedside

## 2017-11-07 NOTE — ED Triage Notes (Signed)
Arrives with c/o worsening SOB/ DOE and weight gain this week.  States weight has gone up to 183 from baseline of 176 lb.  Denies chest pain.

## 2017-11-07 NOTE — Discharge Instructions (Signed)
Please increase your dose of furosemide (lasix) to 80mg  in the morning and 40mg  at night.  Follow up with Dr. Clayborn Bigness this coming Monday as scheduled - if the snow storm cancels the appointment make sure you reschedule for Tuesday.  Return to the ED sooner for any concerns whatsoever.  It was a pleasure to take care of you today, and thank you for coming to our emergency department.  If you have any questions or concerns before leaving please ask the nurse to grab me and I'm more than happy to go through your aftercare instructions again.  If you were prescribed any opioid pain medication today such as Norco, Vicodin, Percocet, morphine, hydrocodone, or oxycodone please make sure you do not drive when you are taking this medication as it can alter your ability to drive safely.  If you have any concerns once you are home that you are not improving or are in fact getting worse before you can make it to your follow-up appointment, please do not hesitate to call [phone] and come back for further evaluation.  [name], MD  Results for orders placed or performed during the hospital encounter of 11/07/17  Troponin I  Result Value Ref Range   Troponin I 0.06 (HH) <0.03 ng/mL  Brain natriuretic peptide  Result Value Ref Range   B Natriuretic Peptide 724.0 (H) 0.0 - 100.0 pg/mL  Comprehensive metabolic panel  Result Value Ref Range   Sodium 136 135 - 145 mmol/L   Potassium 3.3 (L) 3.5 - 5.1 mmol/L   Chloride 99 (L) 101 - 111 mmol/L   CO2 27 22 - 32 mmol/L   Glucose, Bld 88 65 - 99 mg/dL   BUN 27 (H) 6 - 20 mg/dL   Creatinine, Ser 2.38 (H) 0.61 - 1.24 mg/dL   Calcium 9.3 8.9 - 10.3 mg/dL   Total Protein 8.2 (H) 6.5 - 8.1 g/dL   Albumin 4.0 3.5 - 5.0 g/dL   AST 35 15 - 41 U/L   ALT 26 17 - 63 U/L   Alkaline Phosphatase 148 (H) 38 - 126 U/L   Total Bilirubin 1.1 0.3 - 1.2 mg/dL   GFR calc non Af Amer 27 (L) >60 mL/min   GFR calc Af Amer 31 (L) >60 mL/min   Anion gap 10 5 - 15  CBC with  Differential  Result Value Ref Range   WBC 6.8 3.8 - 10.6 K/uL   RBC 6.18 (H) 4.40 - 5.90 MIL/uL   Hemoglobin 19.5 (H) 13.0 - 18.0 g/dL   HCT 58.8 (H) 40.0 - 52.0 %   MCV 95.1 80.0 - 100.0 fL   MCH 31.5 26.0 - 34.0 pg   MCHC 33.1 32.0 - 36.0 g/dL   RDW 16.1 (H) 11.5 - 14.5 %   Platelets 160 150 - 440 K/uL   Neutrophils Relative % 71 %   Neutro Abs 4.9 1.4 - 6.5 K/uL   Lymphocytes Relative 13 %   Lymphs Abs 0.9 (L) 1.0 - 3.6 K/uL   Monocytes Relative 14 %   Monocytes Absolute 0.9 0.2 - 1.0 K/uL   Eosinophils Relative 1 %   Eosinophils Absolute 0.1 0 - 0.7 K/uL   Basophils Relative 1 %   Basophils Absolute 0.1 0 - 0.1 K/uL   Dg Chest Port 1 View  Result Date: 11/07/2017 CLINICAL DATA:  66 year old male with progressive shortness of breath and weight gain. Evaluate for CHF. EXAM: PORTABLE CHEST 1 VIEW COMPARISON:  Prior chest x-ray 02/16/2017 FINDINGS:  Stable position of left subclavian approach cardiac rhythm maintenance device with the intracardiac defibrillator lead projecting over the right ventricle. Stable cardiomegaly with left heart enlargement. Atherosclerotic calcifications again noted in the transverse aorta. While there is no evidence of overt pulmonary edema. There is slight interval enlargement of chronic bilateral pleural effusions and associated bibasilar atelectasis. No acute osseous abnormality. IMPRESSION: 1. Slight interval enlargement of chronic bilateral pleural effusions. There is associated bibasilar atelectasis. 2. Stable cardiomegaly with left heart enlargement. No overt pulmonary edema. Electronically Signed   By: Jacqulynn Cadet M.D.   On: 11/07/2017 09:12

## 2017-11-07 NOTE — ED Provider Notes (Signed)
[facility] Emergency Department Provider Note  ____________________________________________   First MD Initiated Contact with Patient 11/07/17 [phone]     (approximate)  I have reviewed the triage vital signs and the nursing notes.   HISTORY  Chief Complaint Shortness of Breath   HPI Chris Nguyen is a 66 y.o. male who self presents to the emergency department with roughly 1 week of worsening dyspnea on exertion and shortness of breath.  He has a past medical history of congestive heart failure with last echocardiogram below.  His cardiologist is Dr. Clayborn Bigness and he scheduled an appointment for this coming Monday however the patient is concerned about the snowstorm tomorrow and wanted to get checked out "just to be sure".  He is sleeping on 2-3 pillows.  He has woken up several times short of breath.  He has gained 7 pounds unintentionally recently.  He takes 40 mg of Lasix twice a day however when he feels like he has too much fluid he takes an extra dose or 2.  His symptoms have been gradual onset and slowly progressive.  They are worsened with exertion has no pain at all.  01/18/17 Echo: Impressions:  - Severely depressed left ventricular function globally   moderate to severe the dilated left ventricle   globally depressed   ejection fraction of less than 25%   dilated RV reduced dysfunction   pacemaker in RV   Past Medical History:  Diagnosis Date  . AICD (automatic cardioverter/defibrillator) present   . BPH (benign prostatic hyperplasia)   . CAD (coronary artery disease) 05/14/2006  . Cardiac arrest ([place]) 11/12/2013   Overview:  December 2014: ICD implanted  Last Assessment & Plan:  Relevant Hx: Course: Daily Update: Today's Plan:   . Chronic systolic CHF (congestive heart failure) ([place])   . Erectile dysfunction   . H/O coronary artery bypass surgery 05/17/2015   Overview:  LIMA to LAD   . HLD (hyperlipidemia)   . Osteoarthritis of left wrist     . Paroxysmal atrial fibrillation [facility])     Patient Active Problem List   Diagnosis Date Noted  . Allergic rhinitis 11/05/2017  . HLD (hyperlipidemia) 02/25/2016  . Acute on chronic systolic CHF (congestive heart failure) ([place]) 02/24/2016  . Chronic kidney disease (CKD), stage III (moderate) ([place]) 05/17/2015  . Acid reflux 05/17/2015  . Heart failure, systolic ([place]) 85/27/7824  . Hyperlipidemia   . Hypertension   . Hypertensive cardiomyopathy ([place])   . Cardiomyopathy ([place])   . Foot pain, left   . BPH (benign prostatic hyperplasia)   . Erectile dysfunction   . Left leg weakness   . Encounter for adjustment or management of automatic implantable cardioverter-defibrillator 02/07/2014  . Paroxysmal a-fib ([place]) 12/26/2013  . Automatic implantable cardioverter-defibrillator in situ 12/15/2013  . Osteoarthritis of left wrist 09/27/2010  . CAD (coronary artery disease) 05/14/2006    Past Surgical History:  Procedure Laterality Date  . CARDIAC CATHETERIZATION  06/27/2008  . CARPAL TUNNEL RELEASE    . CORONARY ARTERY BYPASS GRAFT  2006 and 2014   LIMA to LAD at [facility] 2014  . MITRAL VALVE REPAIR      Prior to Admission medications   Medication Sig Start Date End Date Taking? Authorizing Provider  albuterol (PROVENTIL HFA;VENTOLIN HFA) 108 (90 Base) MCG/ACT inhaler Inhale 2 puffs into the lungs every 6 (six) hours as needed for wheezing or shortness of breath. Patient not taking: Reported on 02/16/2017 02/08/16   Birdie Sons, MD  amiodarone (PACERONE) 200 MG tablet TAKE 1 TABLET DAILY FOR ATRIAL FIBRILLATION 12/11/15   Birdie Sons, MD  aspirin 81 MG chewable tablet Chew by mouth.    [provider]  atorvastatin (LIPITOR) 80 MG tablet TAKE 1 TABLET EVERY DAY Patient not taking: Reported on 11/05/2017 02/05/16   Birdie Sons, MD  empagliflozin (JARDIANCE) 10 MG TABS tablet Take 10 mg by mouth daily.    [provider]  fluticasone (FLONASE) 50 MCG/ACT nasal  spray Place 2 sprays into both nostrils daily.    [provider]  folic acid (FOLVITE) 1 MG tablet TAKE ONE TABLET BY MOUTH ONCE DAILY 08/08/16   Birdie Sons, MD  furosemide (LASIX) 40 MG tablet Take 1 tablet (40 mg total) by mouth daily. 01/19/17 06/08/18  Bettey Costa, MD  levothyroxine (SYNTHROID, LEVOTHROID) 25 MCG tablet Take 1 tablet (25 mcg total) by mouth daily before breakfast. 11/06/17   Birdie Sons, MD  lisinopril (PRINIVIL,ZESTRIL) 20 MG tablet TAKE 1 TABLET EVERY DAY 04/01/17   Birdie Sons, MD  loratadine (CLARITIN) 10 MG tablet Take 10 mg by mouth daily.    [provider]  metoprolol succinate (TOPROL-XL) 50 MG 24 hr tablet Take 1 tablet (50 mg total) by mouth daily. 12/22/16   Birdie Sons, MD  montelukast (SINGULAIR) 10 MG tablet Take 1 tablet (10 mg total) by mouth at bedtime. 11/05/17   Birdie Sons, MD  Multiple Vitamins-Minerals (CENTRUM SILVER ADULT 50+) TABS Take 1 tablet by mouth daily.    [provider]  sodium chloride (OCEAN) 0.65 % SOLN nasal spray Place 1 spray into both nostrils as needed for congestion.    [provider]  tadalafil (CIALIS) 5 MG tablet Take 5 mg by mouth daily as needed for erectile dysfunction. 11/07/14   [provider]  TraMADol HCl 50 MG TBDP Take 50 mg by mouth 2 (two) times daily as needed. 02/10/14   [provider]    Allergies Spironolactone; Entresto [sacubitril-valsartan]; and Ziac [bisoprolol-hydrochlorothiazide]  Family History  Problem Relation Age of Onset  . CAD Mother 35  . Diabetes Mother        Diabetes Mellitus  . Dementia Father   . Glaucoma Father   . Heart disease Unknown     Social History Social History   Tobacco Use  . Smoking status: Former Smoker    Last attempt to quit: 12/01/2004    Years since quitting: 12.9  . Smokeless tobacco: Never Used  Substance Use Topics  . Alcohol use: Yes    Alcohol/week: 4.2 - 5.4 oz    Types: 1 Glasses of  wine, 6 - 8 Cans of beer per week  . Drug use: No    Review of Systems Constitutional: No fever/chills Eyes: No visual changes. ENT: No sore throat. Cardiovascular: Denies chest pain. Respiratory: Positive for shortness of breath. Gastrointestinal: No abdominal pain.  No nausea, no vomiting.  No diarrhea.  No constipation. Genitourinary: Negative for dysuria. Musculoskeletal: Negative for back pain. Skin: Negative for rash. Neurological: Negative for headaches, focal weakness or numbness.   ____________________________________________   PHYSICAL EXAM:  VITAL SIGNS: ED Triage Vitals  Enc Vitals Group     BP      Pulse      Resp      Temp      Temp src      SpO2      Weight      Height  Head Circumference      Peak Flow      Pain Score      Pain Loc      Pain Edu?      Excl. in [place]?     Constitutional: Alert and oriented x4 joking laughing well-appearing nontoxic no diaphoresis Eyes: PERRL EOMI. Head: Atraumatic. Nose: No congestion/rhinnorhea. Mouth/Throat: No trismus Neck: No stridor.  Emboli completely flat with just mild jugular venous distention Cardiovascular: Normal rate, regular rhythm. Grossly normal heart sounds.  Good peripheral circulation. Respiratory: Normal respiratory effort.  No retractions.  Faint crackles at bases Gastrointestinal: Soft and nontender Musculoskeletal: 1+ pitting edema to midshin bilaterally legs are equal in size Neurologic:  Normal speech and language. No gross focal neurologic deficits are appreciated. Skin:  Skin is warm, dry and intact. No rash noted. Psychiatric: Mood and affect are normal. Speech and behavior are normal.    ____________________________________________   DIFFERENTIAL includes but not limited to  Congestive heart failure exacerbation, pulmonary edema, fluid overload, acute coronary syndrome, metabolic derangement ____________________________________________   LABS (all labs ordered are listed,  but only abnormal results are displayed)  Labs Reviewed  TROPONIN I - Abnormal; Notable for the following components:      Result Value   Troponin I 0.06 (*)    All other components within normal limits  BRAIN NATRIURETIC PEPTIDE - Abnormal; Notable for the following components:   B Natriuretic Peptide 724.0 (*)    All other components within normal limits  COMPREHENSIVE METABOLIC PANEL - Abnormal; Notable for the following components:   Potassium 3.3 (*)    Chloride 99 (*)    BUN 27 (*)    Creatinine, Ser 2.38 (*)    Total Protein 8.2 (*)    Alkaline Phosphatase 148 (*)    GFR calc non Af Amer 27 (*)    GFR calc Af Amer 31 (*)    All other components within normal limits  CBC WITH DIFFERENTIAL/PLATELET - Abnormal; Notable for the following components:   RBC 6.18 (*)    Hemoglobin 19.5 (*)    HCT 58.8 (*)    RDW 16.1 (*)    Lymphs Abs 0.9 (*)    All other components within normal limits    Blood work reviewed by me shows elevated troponin and beta natruretic peptide which are roughly at his baseline and likely secondary to chronic stretch.  Slight decrease in GFR from previous is nonspecific __________________________________________  EKG  ED ECG REPORT I, [name], the attending physician, personally viewed and interpreted this ECG.  Date: 11/07/2017 EKG Time:  Rate: 58 Rhythm: Sinus bradycardia QRS Axis: normal Intervals: normal ST/T Wave abnormalities: normal Narrative Interpretation: no evidence of acute ischemia.  Poor R wave progression  ____________________________________________  RADIOLOGY  Chest x-ray reviewed by me with chronic changes but actually improved from previous ____________________________________________   PROCEDURES  Procedure(s) performed: no  Procedures  Critical Care performed: no  Observation: no ____________________________________________   INITIAL IMPRESSION / ASSESSMENT AND PLAN / ED COURSE  Pertinent labs &  imaging results that were available during my care of the patient were reviewed by me and considered in my medical decision making (see chart for details).  The patient arrives quite well-appearing and able to lie completely flat.  He has significant congestive heart failure and is clearly somewhat fluid up although he does not require BiPAP and does not require nitroglycerin.  A single IV dose of Lasix has been given and he feels significantly  improved.  Encouraged him to increase his home dose of Lasix from 40 twice daily to 80 in the morning and 40 at night and to follow-up on Monday or Tuesday as scheduled.  Strict return precautions have been given and the patient verbalized understanding and agreement with plan.      ____________________________________________   FINAL CLINICAL IMPRESSION(S) / ED DIAGNOSES  Final diagnoses:  Other hypervolemia  Congestive heart failure, unspecified HF chronicity, unspecified heart failure type ([place])      NEW MEDICATIONS STARTED DURING THIS VISIT:  This SmartLink is deprecated. Use AVSMEDLIST instead to display the medication list for a patient.   Note:  This document was prepared using Dragon voice recognition software and may include unintentional dictation errors.     [name], MD 11/08/17 [phone]

## 2017-11-12 ENCOUNTER — Other Ambulatory Visit: Payer: Self-pay | Admitting: *Deleted

## 2017-11-19 DIAGNOSIS — I10 Essential (primary) hypertension: Secondary | ICD-10-CM | POA: Diagnosis not present

## 2017-11-19 DIAGNOSIS — R0602 Shortness of breath: Secondary | ICD-10-CM | POA: Diagnosis not present

## 2017-11-19 DIAGNOSIS — I480 Paroxysmal atrial fibrillation: Secondary | ICD-10-CM | POA: Diagnosis not present

## 2017-11-19 DIAGNOSIS — K219 Gastro-esophageal reflux disease without esophagitis: Secondary | ICD-10-CM | POA: Diagnosis not present

## 2017-11-19 DIAGNOSIS — Z95810 Presence of automatic (implantable) cardiac defibrillator: Secondary | ICD-10-CM | POA: Diagnosis not present

## 2017-11-19 DIAGNOSIS — I5022 Chronic systolic (congestive) heart failure: Secondary | ICD-10-CM | POA: Diagnosis not present

## 2017-11-19 DIAGNOSIS — G629 Polyneuropathy, unspecified: Secondary | ICD-10-CM | POA: Diagnosis not present

## 2017-11-19 DIAGNOSIS — I255 Ischemic cardiomyopathy: Secondary | ICD-10-CM | POA: Diagnosis not present

## 2017-11-19 DIAGNOSIS — N183 Chronic kidney disease, stage 3 (moderate): Secondary | ICD-10-CM | POA: Diagnosis not present

## 2017-12-10 NOTE — Telephone Encounter (Signed)
duplicate

## 2018-01-06 ENCOUNTER — Telehealth: Payer: Self-pay | Admitting: Family Medicine

## 2018-01-06 NOTE — Telephone Encounter (Signed)
LM again on 01/06/2018 re need for [facility] and physical

## 2018-01-19 ENCOUNTER — Ambulatory Visit (INDEPENDENT_AMBULATORY_CARE_PROVIDER_SITE_OTHER): Payer: Medicare HMO | Admitting: Family Medicine

## 2018-01-19 VITALS — BP 114/90 | HR 85 | Temp 97.4°F | Resp 18 | Wt 181.0 lb

## 2018-01-19 DIAGNOSIS — I43 Cardiomyopathy in diseases classified elsewhere: Secondary | ICD-10-CM

## 2018-01-19 DIAGNOSIS — I110 Hypertensive heart disease with heart failure: Secondary | ICD-10-CM

## 2018-01-19 DIAGNOSIS — I5023 Acute on chronic systolic (congestive) heart failure: Secondary | ICD-10-CM

## 2018-01-19 DIAGNOSIS — J309 Allergic rhinitis, unspecified: Secondary | ICD-10-CM

## 2018-01-19 DIAGNOSIS — E039 Hypothyroidism, unspecified: Secondary | ICD-10-CM

## 2018-01-19 MED ORDER — IPRATROPIUM BROMIDE 0.03 % NA SOLN: 2.0000 | Freq: Two times a day (BID) | NASAL | 12 refills | Status: DC

## 2018-01-19 NOTE — Patient Instructions (Signed)
   Go ahead and start taking levothyroxine 32mcg daily for thyroid   You can take an extra furosemide if you have increased swelling or shortness of breath

## 2018-01-19 NOTE — Progress Notes (Signed)
Patient: Chris Nguyen Male    DOB: 31-Oct-1951   67 y.o.   MRN: [id number] Visit Date: 01/19/2018  Today's Provider: Lelon Huh, MD   Chief Complaint  Patient presents with  . Foot Swelling    x 1 week   Subjective:    HPI Swelling in both feet: Patient presents reporting that he has had swelling in both feet for the past week. He has also been a little more short of breath during exertion. Swelling is worse in the left foot.  Patient was seen in ER 11/07/2017 for CHF. Patient was given a single IV dose of Lasix and symptoms improved. Patient was encouraged to increase his home dose of Lasix from 40 mg bid to 80 mg in the am and 40 mg at night. Patient says that since the ER visit, he was seen by his Cardiologist (Dr. Clayborn Bigness) on 11-19-2017 and changed Furosemide back to 40mg  every morning.  He did not take furosemide at all this morning.  He stopped taking metoprolol prior to his last cardiology visit. He is also enrolled in a CHF study and is apparently taking empagliflozin as part of the study.    Complains of persistent nasal drainage. States loratadine didn't help and montelukast kept him up all night. Tried fluticasone and astelin nasal sprays which he reports didn't help.   Wt Readings from Last 3 Encounters:  01/19/18 181 lb (82.1 kg)  11/07/17 184 lb (83.5 kg)  11/05/17 184 lb (83.5 kg)    Follow up hypothyroid Lab Results  Component Value Date   TSH 32.33 (H) 11/05/2017   He was prescribed low dose levo thyroxine after tsh checked in December. He filled prescription, but has not yet started to take it.   Allergies  Allergen Reactions  . Spironolactone Other (See Comments)    Hyperkalemia  . Entresto [Sacubitril-Valsartan] Diarrhea  . Ziac [Bisoprolol-Hydrochlorothiazide]      Current Outpatient Medications:  .  aspirin 81 MG chewable tablet, Chew by mouth., Disp: , Rfl:  .  empagliflozin (JARDIANCE) 10 MG TABS tablet, Take 10 mg by mouth daily.,  Disp: , Rfl:  .  furosemide (LASIX) 40 MG tablet, Take 1 tablet (40 mg total) by mouth daily., Disp: 30 tablet, Rfl: 0 .  lisinopril (PRINIVIL,ZESTRIL) 20 MG tablet, TAKE 1 TABLET EVERY DAY, Disp: 90 tablet, Rfl: 4 .  albuterol (PROVENTIL HFA;VENTOLIN HFA) 108 (90 Base) MCG/ACT inhaler, Inhale 2 puffs into the lungs every 6 (six) hours as needed for wheezing or shortness of breath. (Patient not taking: Reported on 02/16/2017), Disp: 1 Inhaler, Rfl: 0 .  amiodarone (PACERONE) 200 MG tablet, TAKE 1 TABLET DAILY FOR ATRIAL FIBRILLATION (Patient not taking: Reported on 01/19/2018), Disp: 90 tablet, Rfl: 3 .  atorvastatin (LIPITOR) 80 MG tablet, TAKE 1 TABLET EVERY DAY (Patient not taking: Reported on 11/05/2017), Disp: 90 tablet, Rfl: 3 .  fluticasone (FLONASE) 50 MCG/ACT nasal spray, Place 2 sprays into both nostrils daily., Disp: , Rfl:  .  folic acid (FOLVITE) 1 MG tablet, TAKE ONE TABLET BY MOUTH ONCE DAILY (Patient not taking: Reported on 01/19/2018), Disp: 30 tablet, Rfl: 12 .  levothyroxine (SYNTHROID, LEVOTHROID) 25 MCG tablet, Take 1 tablet (25 mcg total) by mouth daily before breakfast. (Patient not taking: Reported on 01/19/2018), Disp: 30 tablet, Rfl: 0 .  loratadine (CLARITIN) 10 MG tablet, Take 10 mg by mouth daily., Disp: , Rfl:  .  metoprolol succinate (TOPROL-XL) 50 MG 24 hr tablet, Take  1 tablet (50 mg total) by mouth daily. (Patient not taking: Reported on 01/19/2018), Disp: 90 tablet, Rfl: 3 .  montelukast (SINGULAIR) 10 MG tablet, Take 1 tablet (10 mg total) by mouth at bedtime. (Patient not taking: Reported on 01/19/2018), Disp: 30 tablet, Rfl: 3 .  Multiple Vitamins-Minerals (CENTRUM SILVER ADULT 50+) TABS, Take 1 tablet by mouth daily., Disp: , Rfl:  .  sodium chloride (OCEAN) 0.65 % SOLN nasal spray, Place 1 spray into both nostrils as needed for congestion., Disp: , Rfl:  .  tadalafil (CIALIS) 5 MG tablet, Take 5 mg by mouth daily as needed for erectile dysfunction., Disp: , Rfl:  .   TraMADol HCl 50 MG TBDP, Take 50 mg by mouth 2 (two) times daily as needed., Disp: , Rfl:   Review of Systems  Constitutional: Positive for fatigue. Negative for appetite change, chills and fever.  Respiratory: Positive for cough (dry) and shortness of breath (during exertion). Negative for chest tightness and wheezing.   Cardiovascular: Positive for leg swelling (in both feet). Negative for chest pain and palpitations.  Gastrointestinal: Negative for abdominal pain, nausea and vomiting.    Social History   Tobacco Use  . Smoking status: Former Smoker    Last attempt to quit: 12/01/2004    Years since quitting: 13.1  . Smokeless tobacco: Never Used  Substance Use Topics  . Alcohol use: Yes    Alcohol/week: 4.2 - 5.4 oz    Types: 1 Glasses of wine, 6 - 8 Cans of beer per week   Objective:   BP 114/90 (BP Location: Right Arm, Cuff Size: Normal)   Pulse 85   Temp (!) 97.4 F (36.3 C) (Oral)   Resp 18   Wt 181 lb (82.1 kg)   SpO2 95% Comment: room air  BMI 26.73 kg/m  There were no vitals filed for this visit.   Physical Exam   General Appearance:    Alert, cooperative, no distress  Eyes:    PERRL, conjunctiva/corneas clear, EOM's intact       Lungs:     Clear to auscultation bilaterally, respirations unlabored  Heart:    Regular rate and rhythm  Neurologic:   Awake, alert, oriented x 3. No apparent focal neurological           defect.   Ext:  1+ edema left lower leg, trace right lower leg.        Assessment & Plan:     1. Allergic rhinitis, unspecified seasonality, unspecified trigger Failed several allergy medications, but he is very frustrated by this. try- ipratropium (ATROVENT) 0.03 % nasal spray; Place 2 sprays into both nostrils every 12 (twelve) hours.  Dispense: 30 mL; Refill: 12  2. Acute on chronic systolic CHF (congestive heart failure) (HCC) Subjectively a little more dyspnic, but clinically stable. Advised he can take additional furosemide when swelling  more than usual or weight increase >3 pounds in a day  3. Hypertensive cardiomyopathy, with heart failure ([place]) He is currently not taking metoprolol. He needs to advise clinical study coordinator about this. Continue ACEI and furosemide for the time being.   4. Hypothyroidism, unspecified type He is going to start low 15mcg dose levothyroxine previously presciribed and follow up in 1 month.        Lelon Huh, MD  [facility]

## 2018-01-23 ENCOUNTER — Emergency Department: Payer: Medicare HMO

## 2018-01-23 ENCOUNTER — Inpatient Hospital Stay
Admission: EM | Admit: 2018-01-23 | Discharge: 2018-01-31 | DRG: 291 | Disposition: A | Discharge status: Home Health | Payer: Medicare HMO | Attending: Internal Medicine | Admitting: Internal Medicine

## 2018-01-23 ENCOUNTER — Other Ambulatory Visit: Payer: Self-pay

## 2018-01-23 ENCOUNTER — Encounter: Payer: Self-pay | Admitting: Emergency Medicine

## 2018-01-23 DIAGNOSIS — Z83511 Family history of glaucoma: Secondary | ICD-10-CM

## 2018-01-23 DIAGNOSIS — Z79890 Hormone replacement therapy (postmenopausal): Secondary | ICD-10-CM | POA: Diagnosis not present

## 2018-01-23 DIAGNOSIS — I10 Essential (primary) hypertension: Secondary | ICD-10-CM | POA: Diagnosis not present

## 2018-01-23 DIAGNOSIS — Z8674 Personal history of sudden cardiac arrest: Secondary | ICD-10-CM | POA: Diagnosis not present

## 2018-01-23 DIAGNOSIS — R809 Proteinuria, unspecified: Secondary | ICD-10-CM | POA: Diagnosis not present

## 2018-01-23 DIAGNOSIS — Z833 Family history of diabetes mellitus: Secondary | ICD-10-CM

## 2018-01-23 DIAGNOSIS — Z95810 Presence of automatic (implantable) cardiac defibrillator: Secondary | ICD-10-CM

## 2018-01-23 DIAGNOSIS — Z7982 Long term (current) use of aspirin: Secondary | ICD-10-CM | POA: Diagnosis not present

## 2018-01-23 DIAGNOSIS — Z951 Presence of aortocoronary bypass graft: Secondary | ICD-10-CM

## 2018-01-23 DIAGNOSIS — N400 Enlarged prostate without lower urinary tract symptoms: Secondary | ICD-10-CM | POA: Diagnosis present

## 2018-01-23 DIAGNOSIS — E785 Hyperlipidemia, unspecified: Secondary | ICD-10-CM | POA: Diagnosis not present

## 2018-01-23 DIAGNOSIS — N183 Chronic kidney disease, stage 3 (moderate): Secondary | ICD-10-CM | POA: Diagnosis not present

## 2018-01-23 DIAGNOSIS — M19032 Primary osteoarthritis, left wrist: Secondary | ICD-10-CM | POA: Diagnosis present

## 2018-01-23 DIAGNOSIS — E782 Mixed hyperlipidemia: Secondary | ICD-10-CM

## 2018-01-23 DIAGNOSIS — I119 Hypertensive heart disease without heart failure: Secondary | ICD-10-CM | POA: Diagnosis present

## 2018-01-23 DIAGNOSIS — I2510 Atherosclerotic heart disease of native coronary artery without angina pectoris: Secondary | ICD-10-CM | POA: Diagnosis not present

## 2018-01-23 DIAGNOSIS — I272 Pulmonary hypertension, unspecified: Secondary | ICD-10-CM | POA: Diagnosis present

## 2018-01-23 DIAGNOSIS — I130 Hypertensive heart and chronic kidney disease with heart failure and stage 1 through stage 4 chronic kidney disease, or unspecified chronic kidney disease: Secondary | ICD-10-CM | POA: Diagnosis not present

## 2018-01-23 DIAGNOSIS — E039 Hypothyroidism, unspecified: Secondary | ICD-10-CM | POA: Diagnosis present

## 2018-01-23 DIAGNOSIS — E876 Hypokalemia: Secondary | ICD-10-CM

## 2018-01-23 DIAGNOSIS — I255 Ischemic cardiomyopathy: Secondary | ICD-10-CM | POA: Diagnosis present

## 2018-01-23 DIAGNOSIS — Z8249 Family history of ischemic heart disease and other diseases of the circulatory system: Secondary | ICD-10-CM

## 2018-01-23 DIAGNOSIS — I129 Hypertensive chronic kidney disease with stage 1 through stage 4 chronic kidney disease, or unspecified chronic kidney disease: Secondary | ICD-10-CM | POA: Diagnosis not present

## 2018-01-23 DIAGNOSIS — I509 Heart failure, unspecified: Secondary | ICD-10-CM

## 2018-01-23 DIAGNOSIS — I429 Cardiomyopathy, unspecified: Secondary | ICD-10-CM

## 2018-01-23 DIAGNOSIS — Z888 Allergy status to other drugs, medicaments and biological substances status: Secondary | ICD-10-CM

## 2018-01-23 DIAGNOSIS — I5020 Unspecified systolic (congestive) heart failure: Secondary | ICD-10-CM | POA: Diagnosis not present

## 2018-01-23 DIAGNOSIS — I5023 Acute on chronic systolic (congestive) heart failure: Secondary | ICD-10-CM | POA: Diagnosis not present

## 2018-01-23 DIAGNOSIS — I43 Cardiomyopathy in diseases classified elsewhere: Secondary | ICD-10-CM | POA: Diagnosis not present

## 2018-01-23 DIAGNOSIS — I5031 Acute diastolic (congestive) heart failure: Secondary | ICD-10-CM | POA: Diagnosis not present

## 2018-01-23 DIAGNOSIS — I5043 Acute on chronic combined systolic (congestive) and diastolic (congestive) heart failure: Secondary | ICD-10-CM | POA: Diagnosis not present

## 2018-01-23 DIAGNOSIS — J310 Chronic rhinitis: Secondary | ICD-10-CM | POA: Diagnosis present

## 2018-01-23 DIAGNOSIS — I071 Rheumatic tricuspid insufficiency: Secondary | ICD-10-CM | POA: Diagnosis present

## 2018-01-23 DIAGNOSIS — R0602 Shortness of breath: Secondary | ICD-10-CM

## 2018-01-23 DIAGNOSIS — E871 Hypo-osmolality and hyponatremia: Secondary | ICD-10-CM | POA: Diagnosis not present

## 2018-01-23 DIAGNOSIS — R05 Cough: Secondary | ICD-10-CM | POA: Diagnosis not present

## 2018-01-23 DIAGNOSIS — I480 Paroxysmal atrial fibrillation: Secondary | ICD-10-CM | POA: Diagnosis present

## 2018-01-23 DIAGNOSIS — Z79899 Other long term (current) drug therapy: Secondary | ICD-10-CM | POA: Diagnosis not present

## 2018-01-23 DIAGNOSIS — J209 Acute bronchitis, unspecified: Secondary | ICD-10-CM | POA: Diagnosis present

## 2018-01-23 DIAGNOSIS — J9601 Acute respiratory failure with hypoxia: Secondary | ICD-10-CM | POA: Diagnosis present

## 2018-01-23 DIAGNOSIS — N179 Acute kidney failure, unspecified: Secondary | ICD-10-CM

## 2018-01-23 DIAGNOSIS — I110 Hypertensive heart disease with heart failure: Secondary | ICD-10-CM | POA: Diagnosis not present

## 2018-01-23 DIAGNOSIS — Z87891 Personal history of nicotine dependence: Secondary | ICD-10-CM

## 2018-01-23 LAB — COMPREHENSIVE METABOLIC PANEL
ALT: 24 U/L (ref 17–63)
AST: 51 U/L — AB (ref 15–41)
Albumin: 3.9 g/dL (ref 3.5–5.0)
Alkaline Phosphatase: 145 U/L — ABNORMAL HIGH (ref 38–126)
Anion gap: 13 (ref 5–15)
BUN: 22 mg/dL — ABNORMAL HIGH (ref 6–20)
CHLORIDE: 93 mmol/L — AB (ref 101–111)
CO2: 26 mmol/L (ref 22–32)
Calcium: 9.4 mg/dL (ref 8.9–10.3)
Creatinine, Ser: 1.99 mg/dL — ABNORMAL HIGH (ref 0.61–1.24)
GFR, EST AFRICAN AMERICAN: 38 mL/min — AB (ref >60)
GFR, EST NON AFRICAN AMERICAN: 33 mL/min — AB (ref >60)
Glucose, Bld: 104 mg/dL — ABNORMAL HIGH (ref 65–99)
POTASSIUM: 2.8 mmol/L — AB (ref 3.5–5.1)
Sodium: 132 mmol/L — ABNORMAL LOW (ref 135–145)
Total Bilirubin: 1.7 mg/dL — ABNORMAL HIGH (ref 0.3–1.2)
Total Protein: 8.2 g/dL — ABNORMAL HIGH (ref 6.5–8.1)

## 2018-01-23 LAB — CBC WITH DIFFERENTIAL/PLATELET
BASOS ABS: 0.1 10*3/uL (ref 0–0.1)
Basophils Relative: 1 %
EOS PCT: 1 %
Eosinophils Absolute: 0.1 10*3/uL (ref 0–0.7)
HCT: 55 % — ABNORMAL HIGH (ref 40.0–52.0)
Hemoglobin: 18.4 g/dL — ABNORMAL HIGH (ref 13.0–18.0)
LYMPHS PCT: 10 %
Lymphs Abs: 0.7 10*3/uL — ABNORMAL LOW (ref 1.0–3.6)
MCH: 30.7 pg (ref 26.0–34.0)
MCHC: 33.4 g/dL (ref 32.0–36.0)
MCV: 91.8 fL (ref 80.0–100.0)
MONO ABS: 1 10*3/uL (ref 0.2–1.0)
Monocytes Relative: 15 %
Neutro Abs: 5 10*3/uL (ref 1.4–6.5)
Neutrophils Relative %: 73 %
PLATELETS: 158 10*3/uL (ref 150–440)
RBC: 5.99 MIL/uL — ABNORMAL HIGH (ref 4.40–5.90)
RDW: 15.4 % — AB (ref 11.5–14.5)
WBC: 6.8 10*3/uL (ref 3.8–10.6)

## 2018-01-23 LAB — TROPONIN I
TROPONIN I: 0.07 ng/mL — AB (ref <0.03)
Troponin I: 0.08 ng/mL (ref <0.03)
Troponin I: 0.08 ng/mL (ref <0.03)

## 2018-01-23 LAB — BRAIN NATRIURETIC PEPTIDE
B NATRIURETIC PEPTIDE 5: 957 pg/mL — AB (ref 0.0–100.0)
B NATRIURETIC PEPTIDE 5: 888 pg/mL — AB (ref 0.0–100.0)

## 2018-01-23 LAB — MAGNESIUM: MAGNESIUM: 2.1 mg/dL (ref 1.7–2.4)

## 2018-01-23 LAB — TSH: TSH: 21.056 u[IU]/mL — AB (ref 0.350–4.500)

## 2018-01-23 LAB — POTASSIUM: Potassium: 3.1 mmol/L — ABNORMAL LOW (ref 3.5–5.1)

## 2018-01-23 MED ORDER — FUROSEMIDE 10 MG/ML IJ SOLN
40.0000 mg | Freq: Every day | INTRAMUSCULAR | Status: DC
Start: 2018-01-23 — End: 2018-01-24
  Administered 2018-01-23 – 2018-01-24 (×2): 40 mg via INTRAVENOUS
  Filled 2018-01-23 (×2): qty 4

## 2018-01-23 MED ORDER — CARVEDILOL 3.125 MG PO TABS
3.1250 mg | ORAL_TABLET | Freq: Two times a day (BID) | ORAL | Status: DC
  Administered 2018-01-23 – 2018-01-31 (×13): 3.125 mg via ORAL
  Filled 2018-01-23 (×16): qty 1

## 2018-01-23 MED ORDER — ASPIRIN 81 MG PO CHEW
81.0000 mg | CHEWABLE_TABLET | Freq: Every day | ORAL | Status: DC
  Administered 2018-01-23 – 2018-01-31 (×9): 81 mg via ORAL
  Filled 2018-01-23 (×9): qty 1

## 2018-01-23 MED ORDER — LEVOTHYROXINE SODIUM 25 MCG PO TABS
25.0000 ug | ORAL_TABLET | Freq: Every day | ORAL | Status: DC
  Administered 2018-01-24: 25 ug via ORAL
  Filled 2018-01-23: qty 1

## 2018-01-23 MED ORDER — ATORVASTATIN CALCIUM 20 MG PO TABS
80.0000 mg | ORAL_TABLET | Freq: Every day | ORAL | Status: DC
  Administered 2018-01-23 – 2018-01-31 (×9): 80 mg via ORAL
  Filled 2018-01-23 (×9): qty 4

## 2018-01-23 MED ORDER — SPIRONOLACTONE 25 MG PO TABS
50.0000 mg | ORAL_TABLET | Freq: Two times a day (BID) | ORAL | Status: DC
  Administered 2018-01-23 – 2018-01-25 (×6): 50 mg via ORAL
  Filled 2018-01-23 (×3): qty 2
  Filled 2018-01-23: qty 1
  Filled 2018-01-23 (×4): qty 2

## 2018-01-23 MED ORDER — LISINOPRIL 20 MG PO TABS: 20.0000 mg | ORAL_TABLET | Freq: Every day | ORAL | Status: DC

## 2018-01-23 MED ORDER — POTASSIUM CHLORIDE 20 MEQ/15ML (10%) PO SOLN
40.0000 meq | Freq: Once | ORAL | Status: AC
  Administered 2018-01-23: 40 meq via ORAL
  Filled 2018-01-23: qty 30

## 2018-01-23 MED ORDER — ONDANSETRON HCL 4 MG/2ML IJ SOLN: 4.0000 mg | Freq: Four times a day (QID) | INTRAMUSCULAR | Status: DC | PRN

## 2018-01-23 MED ORDER — SODIUM CHLORIDE 0.9% FLUSH
3.0000 mL | Freq: Two times a day (BID) | INTRAVENOUS | Status: DC
  Administered 2018-01-23 – 2018-01-30 (×16): 3 mL via INTRAVENOUS

## 2018-01-23 MED ORDER — HEPARIN SODIUM (PORCINE) 5000 UNIT/ML IJ SOLN
5000.0000 [IU] | Freq: Three times a day (TID) | INTRAMUSCULAR | Status: DC
  Administered 2018-01-23 – 2018-01-30 (×15): 5000 [IU] via SUBCUTANEOUS
  Filled 2018-01-23 (×16): qty 1

## 2018-01-23 MED ORDER — POTASSIUM CHLORIDE 10 MEQ/100ML IV SOLN
10.0000 meq | Freq: Once | INTRAVENOUS | Status: AC
  Administered 2018-01-23: 10 meq via INTRAVENOUS
  Filled 2018-01-23: qty 100

## 2018-01-23 MED ORDER — SODIUM CHLORIDE 0.9% FLUSH: 3.0000 mL | INTRAVENOUS | Status: DC | PRN

## 2018-01-23 MED ORDER — ACETAMINOPHEN 325 MG PO TABS: 650.0000 mg | ORAL_TABLET | ORAL | Status: DC | PRN

## 2018-01-23 MED ORDER — LISINOPRIL 10 MG PO TABS
10.0000 mg | ORAL_TABLET | Freq: Every day | ORAL | Status: DC
  Administered 2018-01-24 – 2018-01-25 (×2): 10 mg via ORAL
  Filled 2018-01-23 (×2): qty 1

## 2018-01-23 MED ORDER — POTASSIUM CHLORIDE CRYS ER 20 MEQ PO TBCR
EXTENDED_RELEASE_TABLET | ORAL | Status: AC
  Filled 2018-01-23: qty 2

## 2018-01-23 MED ORDER — SODIUM CHLORIDE 0.9 % IV SOLN: 250.0000 mL | INTRAVENOUS | Status: DC | PRN

## 2018-01-23 MED ORDER — ALPRAZOLAM 0.25 MG PO TABS
0.2500 mg | ORAL_TABLET | Freq: Two times a day (BID) | ORAL | Status: DC | PRN
  Administered 2018-01-23 – 2018-01-26 (×4): 0.25 mg via ORAL
  Filled 2018-01-23 (×5): qty 1

## 2018-01-23 NOTE — ED Notes (Signed)
Date and time results received: 01/23/18 0957  Test: Troponin Critical Value: 0.07  Name of Provider Notified: Dr. Jacqualine Code  Orders Received? Or Actions Taken?: no new orders.  Will continue to monitor.

## 2018-01-23 NOTE — Progress Notes (Signed)
PAGE VIA PHONE DR. SALARY HOWEVER MD DID NOT RETURN PAGED. SENT DR. SALARY A TEXT PAGE TO MAKE AWARE TROPONIN IS 0.8 AND POTASSIUM 3.1 WAITING FOR MD TO RETURN

## 2018-01-23 NOTE — Progress Notes (Signed)
Spoke with dr. Jerelyn Charles to make aware of potasium 3.1 and troponin 0.08. Per md continue to monitor and order cardiology consult

## 2018-01-23 NOTE — H&P (Signed)
[facility] NAME: Chris Nguyen    MR#:  [id number]  DATE OF BIRTH:  20-Dec-1950  DATE OF ADMISSION:  01/23/2018  PRIMARY CARE PHYSICIAN: Birdie Sons, MD   REQUESTING/REFERRING PHYSICIAN:   CHIEF COMPLAINT:   Chief Complaint  Patient presents with  . Cough  . Shortness of Breath    HISTORY OF PRESENT ILLNESS: Chris Nguyen  is a 67 y.o. male with a known history per below, presented to the emergency room with 2-week history of nonproductive cough, inability to lay flat while in bed for shortness of breath, worsening swelling in the legs, in the emergency room patient was found to have congestive heart failure, son is at the bedside, resting comfortably in bed in no apparent distress, patient is now being admitted for acute on chronic systolic congestive heart failure exacerbation.  PAST MEDICAL HISTORY:   Past Medical History:  Diagnosis Date  . AICD (automatic cardioverter/defibrillator) present   . BPH (benign prostatic hyperplasia)   . CAD (coronary artery disease) 05/14/2006  . Cardiac arrest ([place]) 11/12/2013   Overview:  December 2014: ICD implanted  Last Assessment & Plan:  Relevant Hx: Course: Daily Update: Today's Plan:   . Chronic systolic CHF (congestive heart failure) ([place])   . Erectile dysfunction   . H/O coronary artery bypass surgery 05/17/2015   Overview:  LIMA to LAD   . HLD (hyperlipidemia)   . Osteoarthritis of left wrist   . Paroxysmal atrial fibrillation ([place])     PAST SURGICAL HISTORY:  Past Surgical History:  Procedure Laterality Date  . CARDIAC CATHETERIZATION  06/27/2008  . CARPAL TUNNEL RELEASE    . CORONARY ARTERY BYPASS GRAFT  2006 and 2014   LIMA to LAD at [facility] 2014  . MITRAL VALVE REPAIR      SOCIAL HISTORY:  Social History   Tobacco Use  . Smoking status: Former Smoker    Last attempt to quit: 12/01/2004    Years since quitting: 13.1  . Smokeless tobacco: Never Used  Substance Use  Topics  . Alcohol use: Yes    Alcohol/week: 4.2 - 5.4 oz    Types: 1 Glasses of wine, 6 - 8 Cans of beer per week    FAMILY HISTORY:  Family History  Problem Relation Age of Onset  . CAD Mother 48  . Diabetes Mother        Diabetes Mellitus  . Dementia Father   . Glaucoma Father   . Heart disease Unknown     DRUG ALLERGIES:  Allergies  Allergen Reactions  . Spironolactone Other (See Comments)    Hyperkalemia  . Entresto [Sacubitril-Valsartan] Diarrhea  . Ziac [Bisoprolol-Hydrochlorothiazide]     REVIEW OF SYSTEMS:   CONSTITUTIONAL: No fever, +fatigue/weakness.  EYES: No blurred or double vision.  EARS, NOSE, AND THROAT: No tinnitus or ear pain.  RESPIRATORY: + cough, shortness of breath, no wheezing or hemoptysis.  CARDIOVASCULAR: No chest pain, orthopnea, edema.  GASTROINTESTINAL: No nausea, vomiting, diarrhea or abdominal pain.  GENITOURINARY: No dysuria, hematuria.  ENDOCRINE: No polyuria, nocturia,  HEMATOLOGY: No anemia, easy bruising or bleeding SKIN: No rash or lesion. MUSCULOSKELETAL: No joint pain or arthritis.   NEUROLOGIC: No tingling, numbness, weakness.  PSYCHIATRY: No anxiety or depression.   MEDICATIONS AT HOME:  Prior to Admission medications   Medication Sig Start Date End Date Taking? Authorizing Provider  aspirin 81 MG chewable tablet Chew 81 mg by mouth daily.    Yes  [provider]  furosemide (LASIX) 40 MG tablet Take 1 tablet (40 mg total) by mouth daily. Patient taking differently: Take 40 mg by mouth 2 (two) times daily.  01/19/17 06/08/18 Yes Bettey Costa, MD  levothyroxine (SYNTHROID, LEVOTHROID) 25 MCG tablet Take 1 tablet (25 mcg total) by mouth daily before breakfast. 11/06/17  Yes Birdie Sons, MD  lisinopril (PRINIVIL,ZESTRIL) 20 MG tablet TAKE 1 TABLET EVERY DAY 04/01/17  Yes Birdie Sons, MD  atorvastatin (LIPITOR) 80 MG tablet TAKE 1 TABLET EVERY DAY Patient not taking: Reported on 11/05/2017 02/05/16   Birdie Sons, MD   metoprolol succinate (TOPROL-XL) 50 MG 24 hr tablet Take 1 tablet (50 mg total) by mouth daily. Patient not taking: Reported on 01/19/2018 12/22/16   Birdie Sons, MD      PHYSICAL EXAMINATION:   VITAL SIGNS: Blood pressure (!) 156/102, pulse 85, temperature 97.7 F (36.5 C), temperature source Oral, resp. rate 18, height 5\' 10"  (1.778 m), weight 81.6 kg (180 lb), SpO2 96 %.  GENERAL:  67 y.o.-year-old patient lying in the bed with no acute distress.  EYES: Pupils equal, round, reactive to light and accommodation. No scleral icterus. Extraocular muscles intact.  HEENT: Head atraumatic, normocephalic. Oropharynx and nasopharynx clear.  NECK:  Supple, no jugular venous distention. No thyroid enlargement, no tenderness.  LUNGS: Rales on auscultation of the lungs bilaterally at bases. No use of accessory muscles of respiration.  CARDIOVASCULAR: S1, S2 normal. No murmurs, rubs, or gallops.  ABDOMEN: Soft, nontender, nondistended. Bowel sounds present. No organomegaly or mass.  EXTREMITIES: Bilateral lower extremity pitting edema, cyanosis, or clubbing.  NEUROLOGIC: Cranial nerves II through XII are intact. Muscle strength 5/5 in all extremities. Sensation intact. Gait not checked.  PSYCHIATRIC: The patient is alert and oriented x 3.  SKIN: No obvious rash, lesion, or ulcer.   LABORATORY PANEL:   CBC Recent Labs  Lab 01/23/18 0916  WBC 6.8  HGB 18.4*  HCT 55.0*  PLT 158  MCV 91.8  MCH 30.7  MCHC 33.4  RDW 15.4*  LYMPHSABS 0.7*  MONOABS 1.0  EOSABS 0.1  BASOSABS 0.1   ------------------------------------------------------------------------------------------------------------------  Chemistries  Recent Labs  Lab 01/23/18 0916  NA 132*  K 2.8*  CL 93*  CO2 26  GLUCOSE 104*  BUN 22*  CREATININE 1.99*  CALCIUM 9.4  AST 51*  ALT 24  ALKPHOS 145*  BILITOT 1.7*    ------------------------------------------------------------------------------------------------------------------ estimated creatinine clearance is 37.2 mL/min (A) (by C-G formula based on SCr of 1.99 mg/dL (H)). ------------------------------------------------------------------------------------------------------------------ No results for input(s): TSH, T4TOTAL, T3FREE, THYROIDAB in the last 72 hours.  Invalid input(s): FREET3   Coagulation profile No results for input(s): INR, PROTIME in the last 168 hours. ------------------------------------------------------------------------------------------------------------------- No results for input(s): DDIMER in the last 72 hours. -------------------------------------------------------------------------------------------------------------------  Cardiac Enzymes Recent Labs  Lab 01/23/18 0916  TROPONINI 0.07*   ------------------------------------------------------------------------------------------------------------------ Invalid input(s): POCBNP  ---------------------------------------------------------------------------------------------------------------  Urinalysis No results found for: COLORURINE, APPEARANCEUR, LABSPEC, PHURINE, GLUCOSEU, HGBUR, BILIRUBINUR, KETONESUR, PROTEINUR, UROBILINOGEN, NITRITE, LEUKOCYTESUR   RADIOLOGY: Dg Chest 2 View  Result Date: 01/23/2018 CLINICAL DATA:  Cough and shortness of breath EXAM: CHEST  2 VIEW COMPARISON:  November 07, 2017 FINDINGS: Small bilateral pleural effusions with underlying atelectasis. Mild pulmonary venous congestion without overt edema. Stable AICD device. Cardiomegaly. No other changes. IMPRESSION: Cardiomegaly, pulmonary venous congestion, and small bilateral pleural effusions. Electronically Signed   By: Dorise Bullion III M.D   On: 01/23/2018 10:07    EKG: Orders placed  or performed during the hospital encounter of 01/23/18  . EKG 12-Lead  . EKG 12-Lead  . ED EKG   . ED EKG    IMPRESSION AND PLAN: 1 acute on chronic systolic and diastolic congestive heart failure exacerbation with Status post AICD Admit to medical floor on our congestive heart failure protocol, spironolactone twice daily given severe hypokalemia, IV Lasix daily, strict I&O monitoring, daily weights, lisinopril, Coreg, aspirin, check lipids in the morning, repeat echocardiogram, and continue close medical monitoring Most recent echocardiogram noted for ejection fraction 20-25% with stage I diastolic dysfunction  2 acute severe hypokalemia Did receive IV and p.o. potassium in the emergency room, start Spironolactone twice daily, repeat potassium level at 1400 hrs., check magnesium level, BMP in the morning  3 chronic hyperlipidemia, unspecified Continue statin therapy and check lipids in the morning  4 history of coronary artery disease status post CABG Continue aspirin, statin therapy, lisinopril, Coreg, and continue close medical monitoring  5 history of BPH Stable Continue conservative management/observation  All the records are reviewed and case discussed with ED provider. Management plans discussed with the patient, family and they are in agreement.  CODE STATUS:full Code Status History    Date Active Date Inactive Code Status Order ID Comments User Context   01/16/2017 15:21 01/19/2017 15:03 Full Code [id number]  Vaughan Basta, MD Inpatient   02/25/2016 00:58 02/25/2016 20:16 Full Code [id number]  Lance Coon, MD ED       TOTAL TIME TAKING CARE OF THIS PATIENT: 45 minutes.    Avel Peace Candise Crabtree M.D on 01/23/2018   Between 7am to 6pm - Pager - [phone]  After 6pm go to [email or web address] - password [email or web address] [facility]  Office  [phone]  CC: Primary care physician; Birdie Sons, MD   Note: This dictation was prepared with Dragon dictation along with smaller phrase technology. Any transcriptional errors that result from this  process are unintentional.

## 2018-01-23 NOTE — ED Provider Notes (Signed)
[facility] Emergency Department Provider Note   ____________________________________________   First MD Initiated Contact with Patient 01/23/18 [phone]     (approximate)  I have reviewed the triage vital signs and the nursing notes.   HISTORY  Chief Complaint Cough and Shortness of Breath    HPI Chris Nguyen is a 67 y.o. male of cardiomyopathy, heart failure, previous cardiac arrest, and recent rhinitis  Patient reports for about 2 weeks now he is been having a slight increased cough, he saw his doctor just a couple days ago, reports his symptoms seem to worsen the last day and 1/2-2 days where he estimates he is gained about 5 pounds of water weight.  He is noticed his ankles both seem more swollen and is noticed the veins around his neck seem a little more swollen as well.  Reports the symptoms of shortness of breath or noticeable slightly at rest, but when he lays down at night it is worse.  Denies fevers.  No productive cough.  No productive white sputum.  He does take Lasix, usually takes 1 pill daily, but today he took 2 pills of his he says about December or so he had similar symptoms and had to have his Lasix increased.  No chest pain.  No calf pain.  Remains compliant with his medications.  No fevers or chills.  Past Medical History:  Diagnosis Date  . AICD (automatic cardioverter/defibrillator) present   . BPH (benign prostatic hyperplasia)   . CAD (coronary artery disease) 05/14/2006  . Cardiac arrest ([place]) 11/12/2013   Overview:  December 2014: ICD implanted  Last Assessment & Plan:  Relevant Hx: Course: Daily Update: Today's Plan:   . Chronic systolic CHF (congestive heart failure) ([place])   . Erectile dysfunction   . H/O coronary artery bypass surgery 05/17/2015   Overview:  LIMA to LAD   . HLD (hyperlipidemia)   . Osteoarthritis of left wrist   . Paroxysmal atrial fibrillation [facility])     Patient Active Problem List   Diagnosis Date Noted    . CHF (congestive heart failure) ([place]) 01/23/2018  . Hypothyroid 01/19/2018  . Allergic rhinitis 11/05/2017  . HLD (hyperlipidemia) 02/25/2016  . Acute on chronic systolic CHF (congestive heart failure) ([place]) 02/24/2016  . Chronic kidney disease (CKD), stage III (moderate) ([place]) 05/17/2015  . Acid reflux 05/17/2015  . Heart failure, systolic ([place]) 32/67/1245  . Hyperlipidemia   . Hypertension   . Hypertensive cardiomyopathy ([place])   . Cardiomyopathy ([place])   . Foot pain, left   . BPH (benign prostatic hyperplasia)   . Erectile dysfunction   . Left leg weakness   . Encounter for adjustment or management of automatic implantable cardioverter-defibrillator 02/07/2014  . Paroxysmal a-fib ([place]) 12/26/2013  . Automatic implantable cardioverter-defibrillator in situ 12/15/2013  . Osteoarthritis of left wrist 09/27/2010  . CAD (coronary artery disease) 05/14/2006    Past Surgical History:  Procedure Laterality Date  . CARDIAC CATHETERIZATION  06/27/2008  . CARPAL TUNNEL RELEASE    . CORONARY ARTERY BYPASS GRAFT  2006 and 2014   LIMA to LAD at [facility] 2014  . MITRAL VALVE REPAIR      Prior to Admission medications   Medication Sig Start Date End Date Taking? Authorizing Provider  aspirin 81 MG chewable tablet Chew 81 mg by mouth daily.    Yes [provider]  furosemide (LASIX) 40 MG tablet Take 1 tablet (40 mg total) by mouth daily. Patient taking differently: Take 40  mg by mouth 2 (two) times daily.  01/19/17 06/08/18 Yes Bettey Costa, MD  levothyroxine (SYNTHROID, LEVOTHROID) 25 MCG tablet Take 1 tablet (25 mcg total) by mouth daily before breakfast. 11/06/17  Yes Birdie Sons, MD  lisinopril (PRINIVIL,ZESTRIL) 20 MG tablet TAKE 1 TABLET EVERY DAY 04/01/17  Yes Birdie Sons, MD  atorvastatin (LIPITOR) 80 MG tablet TAKE 1 TABLET EVERY DAY Patient not taking: Reported on 11/05/2017 02/05/16   Birdie Sons, MD  metoprolol succinate (TOPROL-XL) 50 MG 24 hr tablet Take 1 tablet  (50 mg total) by mouth daily. Patient not taking: Reported on 01/19/2018 12/22/16   Birdie Sons, MD    Allergies Spironolactone; Entresto [sacubitril-valsartan]; and Ziac [bisoprolol-hydrochlorothiazide]  Family History  Problem Relation Age of Onset  . CAD Mother 35  . Diabetes Mother        Diabetes Mellitus  . Dementia Father   . Glaucoma Father   . Heart disease Unknown     Social History Social History   Tobacco Use  . Smoking status: Former Smoker    Last attempt to quit: 12/01/2004    Years since quitting: 13.1  . Smokeless tobacco: Never Used  Substance Use Topics  . Alcohol use: Yes    Alcohol/week: 4.2 - 5.4 oz    Types: 1 Glasses of wine, 6 - 8 Cans of beer per week  . Drug use: No    Review of Systems Constitutional: No fever/chills Eyes: No visual changes. ENT: No sore throat. Cardiovascular: Denies chest pain. Respiratory: See HPI Gastrointestinal: No abdominal pain.  No nausea, no vomiting.  No diarrhea.  No constipation. Genitourinary: Negative for dysuria. Musculoskeletal: Negative for back pain.  Swelling in both ankles. Skin: Negative for rash. Neurological: Negative for headaches, focal weakness or numbness.  ____________________________________________   PHYSICAL EXAM:  VITAL SIGNS: ED Triage Vitals  Enc Vitals Group     BP 01/23/18 0859 (!) 156/102     Pulse Rate 01/23/18 0859 85     Resp 01/23/18 0859 18     Temp 01/23/18 0859 97.7 F (36.5 C)     Temp Source 01/23/18 0859 Oral     SpO2 01/23/18 0859 96 %     Weight 01/23/18 0900 180 lb (81.6 kg)     Height 01/23/18 0900 5\' 10"  (1.778 m)     Head Circumference --      Peak Flow --      Pain Score --      Pain Loc --      Pain Edu? --      Excl. in [place]? --     Constitutional: Alert and oriented. Well appearing and in no acute distress though very slight tachypnea. Eyes: Conjunctivae are normal. Head: Atraumatic. Nose: No congestion/rhinnorhea. Mouth/Throat: Mucous  membranes are moist. Neck: No stridor.  Patient has notable JVD to the level of mandible bilateral while sitting upright Cardiovascular: Normal rate, regular rhythm. Grossly normal heart sounds.  Good peripheral circulation. Respiratory: Very slight increase in respiratory rate.  Minimal use of accessory muscle.  Speaks in full sentences though.  Upper lung fields are clear.  The lower lung fields bilaterally suspect there is a trace amount of rales.  No wheezing.  No increased expiratory phase Gastrointestinal: Soft and nontender. No distention. Musculoskeletal: No lower extremity tenderness he does have 2+ lower extremity pitting edema, equal bilaterally. Neurologic:  Normal speech and language. No gross focal neurologic deficits are appreciated.  Skin:  Skin is warm, dry  and intact. No rash noted. Psychiatric: Mood and affect are normal. Speech and behavior are normal.  ____________________________________________   LABS (all labs ordered are listed, but only abnormal results are displayed)  Labs Reviewed  CBC WITH DIFFERENTIAL/PLATELET - Abnormal; Notable for the following components:      Result Value   RBC 5.99 (*)    Hemoglobin 18.4 (*)    HCT 55.0 (*)    RDW 15.4 (*)    Lymphs Abs 0.7 (*)    All other components within normal limits  COMPREHENSIVE METABOLIC PANEL - Abnormal; Notable for the following components:   Sodium 132 (*)    Potassium 2.8 (*)    Chloride 93 (*)    Glucose, Bld 104 (*)    BUN 22 (*)    Creatinine, Ser 1.99 (*)    Total Protein 8.2 (*)    AST 51 (*)    Alkaline Phosphatase 145 (*)    Total Bilirubin 1.7 (*)    GFR calc non Af Amer 33 (*)    GFR calc Af Amer 38 (*)    All other components within normal limits  TROPONIN I - Abnormal; Notable for the following components:   Troponin I 0.07 (*)    All other components within normal limits  BRAIN NATRIURETIC PEPTIDE - Abnormal; Notable for the following components:   B Natriuretic Peptide 957.0 (*)      All other components within normal limits  MAGNESIUM  POTASSIUM  TROPONIN I  TROPONIN I  TROPONIN I   ____________________________________________  EKG  Reviewed interrupt me at 9:05 AM Heart rate 80 QRS 110 QTC 520 Normal sinus rhythm, occasional PAC.  There is T wave inversion noted in inferior and lateral disc compared with his previous EKG from November 10, 2017 no significant changes found. ____________________________________________  RADIOLOGY  Chest x-ray reviewed, vascular congestion.  See radiology report for full detail.  Small bilateral pleural effusion    ____________________________________________   PROCEDURES  Procedure(s) performed: None  Procedures  Critical Care performed: No  ____________________________________________   INITIAL IMPRESSION / ASSESSMENT AND PLAN / ED COURSE  Pertinent labs & imaging results that were available during my care of the patient were reviewed by me and considered in my medical decision making (see chart for details).  Patient returns for evaluation of dyspnea.  Does have a known history of congestive heart failure.  His symptoms do not sound infectious.  Denies any acute cardiac symptoms, denies any obvious infectious symptoms.  Treated for possible rhinitis by his primary doctor recently.  Patient does demonstrate JVD, peripheral edema, hypertension.  On lab check his potassium is low, also slight prolongation of his QT on his EKG.  Discussed case and care with Dr. Towanda Malkin, after discussion decision made that we will admit patient, start him on potassium with plan to diurese him Via Lasix.  Dr. Wilburn Cornelia advises starting the potassium infusion prior to giving Lasix.  ----------------------------------------- 11:40 AM on 01/23/2018 ----------------------------------------- Patient agreeable with plan for admission.  Resting at this time.         ____________________________________________   FINAL  CLINICAL IMPRESSION(S) / ED DIAGNOSES  Final diagnoses:  Acute on chronic congestive heart failure, unspecified heart failure type (HCC)  Hypokalemia      NEW MEDICATIONS STARTED DURING THIS VISIT:  New Prescriptions   No medications on file     Note:  This document was prepared using Dragon voice recognition software and may include unintentional dictation errors.  Delman Kitten, MD 01/23/18 [phone]

## 2018-01-23 NOTE — ED Triage Notes (Signed)
Cough x 2 weeks. Resp labored in triage.

## 2018-01-23 NOTE — Consult Note (Signed)
Reason for Consult: Congestive heart failure shortness of breath cough Referring Physician: Dr. Clare Charon Salary hospitalist Juanetta Beets primary Cardiologist Clayborn Bigness  Chris Nguyen is an 67 y.o. male.  HPI: Patient is a 67 year old black male history of multiple medical problems ischemic cardiomyopathy ejection fraction around 20-25% coronary disease coronary bypass surgery cardiac arrest with subsequent AICD implantation chronic systolic congestive heart failure hyperlipidemia states that he is gotten progressively short of breath with PND and orthopnea over the last 3-4 days.  Patient increase his Lasix without significant improvement.  Because of worsening heart failure and cough he finally decided come to the emergency room for evaluation he been seen by his primary physician and was treated as an outpatient but he continued to do poorly so presented for further assessment.  Patient denies much in the way of chest pain.  Borderline troponins were unusual in this current state of chronic renal insufficiency.  Past Medical History:  Diagnosis Date  . AICD (automatic cardioverter/defibrillator) present   . BPH (benign prostatic hyperplasia)   . CAD (coronary artery disease) 05/14/2006  . Cardiac arrest ([place]) 11/12/2013   Overview:  December 2014: ICD implanted  Last Assessment & Plan:  Relevant Hx: Course: Daily Update: Today's Plan:   . Chronic systolic CHF (congestive heart failure) ([place])   . Erectile dysfunction   . H/O coronary artery bypass surgery 05/17/2015   Overview:  LIMA to LAD   . HLD (hyperlipidemia)   . Osteoarthritis of left wrist   . Paroxysmal atrial fibrillation [facility])     Past Surgical History:  Procedure Laterality Date  . CARDIAC CATHETERIZATION  06/27/2008  . CARPAL TUNNEL RELEASE    . CORONARY ARTERY BYPASS GRAFT  2006 and 2014   LIMA to LAD at [facility] 2014  . MITRAL VALVE REPAIR      Family History  Problem Relation Age of Onset  . CAD Mother 37  . Diabetes Mother         Diabetes Mellitus  . Dementia Father   . Glaucoma Father   . Heart disease Unknown     Social History:  reports that he quit smoking about 13 years ago. he has never used smokeless tobacco. He reports that he drinks about 4.2 - 5.4 oz of alcohol per week. He reports that he does not use drugs.  Allergies:  Allergies  Allergen Reactions  . Spironolactone Other (See Comments)    Hyperkalemia  . Entresto [Sacubitril-Valsartan] Diarrhea  . Ziac [Bisoprolol-Hydrochlorothiazide]     Medications: I have reviewed the patient's current medications.  Results for orders placed or performed during the hospital encounter of 01/23/18 (from the past 48 hour(s))  CBC with Differential     Status: Abnormal   Collection Time: 01/23/18  9:16 AM  Result Value Ref Range   WBC 6.8 3.8 - 10.6 K/uL   RBC 5.99 (H) 4.40 - 5.90 MIL/uL   Hemoglobin 18.4 (H) 13.0 - 18.0 g/dL   HCT 55.0 (H) 40.0 - 52.0 %   MCV 91.8 80.0 - 100.0 fL   MCH 30.7 26.0 - 34.0 pg   MCHC 33.4 32.0 - 36.0 g/dL   RDW 15.4 (H) 11.5 - 14.5 %   Platelets 158 150 - 440 K/uL   Neutrophils Relative % 73 %   Neutro Abs 5.0 1.4 - 6.5 K/uL   Lymphocytes Relative 10 %   Lymphs Abs 0.7 (L) 1.0 - 3.6 K/uL   Monocytes Relative 15 %   Monocytes Absolute 1.0 0.2 -  1.0 K/uL   Eosinophils Relative 1 %   Eosinophils Absolute 0.1 0 - 0.7 K/uL   Basophils Relative 1 %   Basophils Absolute 0.1 0 - 0.1 K/uL    Comment: Performed at [facility], [address] [place], [place] [address]  Comprehensive metabolic panel     Status: Abnormal   Collection Time: 01/23/18  9:16 AM  Result Value Ref Range   Sodium 132 (L) 135 - 145 mmol/L   Potassium 2.8 (L) 3.5 - 5.1 mmol/L   Chloride 93 (L) 101 - 111 mmol/L   CO2 26 22 - 32 mmol/L   Glucose, Bld 104 (H) 65 - 99 mg/dL   BUN 22 (H) 6 - 20 mg/dL   Creatinine, Ser 1.99 (H) 0.61 - 1.24 mg/dL   Calcium 9.4 8.9 - 10.3 mg/dL   Total Protein 8.2 (H) 6.5 - 8.1 g/dL   Albumin 3.9 3.5 -  5.0 g/dL   AST 51 (H) 15 - 41 U/L   ALT 24 17 - 63 U/L   Alkaline Phosphatase 145 (H) 38 - 126 U/L   Total Bilirubin 1.7 (H) 0.3 - 1.2 mg/dL   GFR calc non Af Amer 33 (L) >60 mL/min   GFR calc Af Amer 38 (L) >60 mL/min    Comment: (NOTE) The eGFR has been calculated using the CKD EPI equation. This calculation has not been validated in all clinical situations. eGFR's persistently <60 mL/min signify possible Chronic Kidney Disease.    Anion gap 13 5 - 15    Comment: Performed at [facility], [address] [place], [place] [address]  Troponin I     Status: Abnormal   Collection Time: 01/23/18  9:16 AM  Result Value Ref Range   Troponin I 0.07 (HH) <0.03 ng/mL    Comment: CRITICAL RESULT CALLED TO, READ BACK BY AND VERIFIED WITH [facility] SHARP AT 0957 ON 01/23/18 BY [facility] Performed at [facility], [address] [place], [place] [address]   Brain natriuretic peptide     Status: Abnormal   Collection Time: 01/23/18  9:16 AM  Result Value Ref Range   B Natriuretic Peptide 957.0 (H) 0.0 - 100.0 pg/mL    Comment: Performed at [facility], [address], [place], [place] [address]  Magnesium     Status: None   Collection Time: 01/23/18  9:16 AM  Result Value Ref Range   Magnesium 2.1 1.7 - 2.4 mg/dL    Comment: Performed at [facility], [place], [place] [address]  Troponin I (q 6hr x 3)     Status: Abnormal   Collection Time: 01/23/18 12:57 PM  Result Value Ref Range   Troponin I 0.08 (HH) <0.03 ng/mL    Comment: CRITICAL VALUE NOTED. VALUE IS CONSISTENT WITH PREVIOUSLY REPORTED/CALLED VALUE SNJ Performed at [facility], [address] [place], [place] [address]   Potassium     Status: Abnormal   Collection Time: 01/23/18 12:59 PM  Result Value Ref Range   Potassium 3.1 (L) 3.5 - 5.1 mmol/L    Comment: Performed at [facility], [address] [place], [place] [address]  TSH     Status: Abnormal    Collection Time: 01/23/18 12:59 PM  Result Value Ref Range   TSH 21.056 (H) 0.350 - 4.500 uIU/mL    Comment: Performed by a 3rd Generation assay with a functional sensitivity of <=0.01 uIU/mL. Performed at [facility], [address] [place], [place] [address]   Brain natriuretic peptide     Status: Abnormal   Collection Time: 01/23/18  1:43 PM  Result Value Ref Range   B Natriuretic Peptide 888.0 (H) 0.0 - 100.0 pg/mL    Comment: Performed at [facility], [place], [place] [address]  Troponin I (q 6hr x 3)     Status: Abnormal   Collection Time: 01/23/18  5:14 PM  Result Value Ref Range   Troponin I 0.08 (HH) <0.03 ng/mL    Comment: CRITICAL VALUE NOTED. VALUE IS CONSISTENT WITH PREVIOUSLY REPORTED/CALLED VALUE.PMH Performed at [facility], [address] [place], [place] [address]     Dg Chest 2 View  Result Date: 01/23/2018 CLINICAL DATA:  Cough and shortness of breath EXAM: CHEST  2 VIEW COMPARISON:  November 07, 2017 FINDINGS: Small bilateral pleural effusions with underlying atelectasis. Mild pulmonary venous congestion without overt edema. Stable AICD device. Cardiomegaly. No other changes. IMPRESSION: Cardiomegaly, pulmonary venous congestion, and small bilateral pleural effusions. Electronically Signed   By: Dorise Bullion III M.D   On: 01/23/2018 10:07    Review of Systems  Constitutional: Positive for diaphoresis and malaise/fatigue.  HENT: Positive for congestion.   Eyes: Negative.   Respiratory: Positive for cough, sputum production and shortness of breath.   Cardiovascular: Positive for orthopnea, leg swelling and PND.  Gastrointestinal: Negative.   Genitourinary: Negative.   Musculoskeletal: Negative.   Skin: Negative.   Neurological: Positive for weakness.  Endo/Heme/Allergies: Negative.   Psychiatric/Behavioral: Negative.    Blood pressure (!) 131/93, pulse 72, temperature 98.1 F (36.7 C), temperature source  Oral, resp. rate (!) 24, height '5\' 10"'$  (1.778 m), weight 180 lb (81.6 kg), SpO2 97 %. Physical Exam  Nursing note and vitals reviewed. Constitutional: He is oriented to person, place, and time. He appears well-developed and well-nourished.  HENT:  Head: Normocephalic and atraumatic.  Eyes: Conjunctivae and EOM are normal. Pupils are equal, round, and reactive to light.  Neck: Normal range of motion. Neck supple.  Cardiovascular:  Murmur heard. Respiratory: Effort normal and breath sounds normal.  GI: Soft. Bowel sounds are normal.  Musculoskeletal: Normal range of motion. He exhibits edema.  Neurological: He is alert and oriented to person, place, and time. He has normal reflexes.  Skin: Skin is warm and dry.  Psychiatric: He has a normal mood and affect.    Assessment/Plan: Congestive heart failure Cardiomyopathy ejection fraction 20-25% Shortness of breath Hyperlipidemia Persistent cough Chronic renal insufficiency Mitral valve disease status post repair Thyroid disease Hypokalemia Abnormal EKG prolonged QT probably related to electrolyte . Plan Agree with admission to telemetry follow-up EKGs and troponins Replace potassium because of hypokalemia Continue intravenous Lasix therapy for heart failure Continue beta-blockade therapy for heart failure Replace Synthroid therapy for hypo-thyroidism Maintain Lipitor therapy for hyperlipidemia History of ventricular tachycardia sudden death with AICD in place Consider inhalers for shortness of breath and cough Do not recommend invasive strategy at this point Follow-up outputs and daily weights   Motty Borin D Antwane Grose 01/23/2018, 8:10 PM

## 2018-01-23 NOTE — ED Notes (Signed)
Pharmacy called for spironolactone. Medication to be given now per Dr. Jerelyn Charles.

## 2018-01-24 ENCOUNTER — Inpatient Hospital Stay
Admit: 2018-01-24 | Discharge: 2018-01-24 | Disposition: A | Discharge status: Home / Self Care | Payer: Medicare HMO | Attending: Family Medicine | Admitting: Family Medicine

## 2018-01-24 LAB — ECHOCARDIOGRAM COMPLETE
CHL CUP STROKE VOLUME: 36 mL
FS: 2 % — AB (ref 28–44)
Height: 70 in
IV/PV OW: 0.8
LA vol A4C: 68.5 ml
LA vol index: 51.5 mL/m2
LA vol: 103 mL
LADIAMINDEX: 3 cm/m2
LASIZE: 60 mm
LEFT ATRIUM END SYS DIAM: 60 mm
LV sys vol: 268 mL — AB (ref 21–61)
LVDIAVOL: 304 mL — AB (ref 62–150)
LVDIAVOLIN: 152 mL/m2
LVSYSVOLIN: 134 mL/m2
PW: 11.7 mm — AB (ref 0.6–1.1)
Simpson's disk: 12
Weight: 2883.2 oz

## 2018-01-24 LAB — TROPONIN I: TROPONIN I: 0.06 ng/mL — AB (ref <0.03)

## 2018-01-24 LAB — BASIC METABOLIC PANEL
Anion gap: 11 (ref 5–15)
BUN: 20 mg/dL (ref 6–20)
CALCIUM: 9 mg/dL (ref 8.9–10.3)
CO2: 24 mmol/L (ref 22–32)
CREATININE: 1.69 mg/dL — AB (ref 0.61–1.24)
Chloride: 99 mmol/L — ABNORMAL LOW (ref 101–111)
GFR calc Af Amer: 47 mL/min — ABNORMAL LOW (ref >60)
GFR, EST NON AFRICAN AMERICAN: 40 mL/min — AB (ref >60)
GLUCOSE: 99 mg/dL (ref 65–99)
Potassium: 3.1 mmol/L — ABNORMAL LOW (ref 3.5–5.1)
Sodium: 134 mmol/L — ABNORMAL LOW (ref 135–145)

## 2018-01-24 MED ORDER — POTASSIUM CHLORIDE CRYS ER 20 MEQ PO TBCR
40.0000 meq | EXTENDED_RELEASE_TABLET | ORAL | Status: AC
  Administered 2018-01-24 (×2): 40 meq via ORAL
  Filled 2018-01-24 (×2): qty 2

## 2018-01-24 MED ORDER — FUROSEMIDE 10 MG/ML IJ SOLN
40.0000 mg | Freq: Two times a day (BID) | INTRAMUSCULAR | Status: DC
  Administered 2018-01-24 – 2018-01-25 (×2): 40 mg via INTRAVENOUS
  Filled 2018-01-24 (×2): qty 4

## 2018-01-24 MED ORDER — LEVOTHYROXINE SODIUM 25 MCG PO TABS: 25.0000 ug | ORAL_TABLET | Freq: Once | ORAL | Status: DC

## 2018-01-24 MED ORDER — LEVOTHYROXINE SODIUM 25 MCG PO TABS
25.0000 ug | ORAL_TABLET | Freq: Every day | ORAL | Status: DC
  Administered 2018-01-25 – 2018-01-31 (×7): 25 ug via ORAL
  Filled 2018-01-24 (×7): qty 1

## 2018-01-24 MED ORDER — LEVOTHYROXINE SODIUM 50 MCG PO TABS: 50.0000 ug | ORAL_TABLET | Freq: Every day | ORAL | Status: DC

## 2018-01-24 NOTE — Progress Notes (Signed)
[place] [facility] [place] NAME: Chris Nguyen    MR#:  [id number]  DATE OF BIRTH:  13-Mar-1951  SUBJECTIVE:  CHIEF COMPLAINT: Shortness of breath is slightly better.  Patient is started on Synthroid 25 mcg by primary care physician on last Wednesday.  Reports legs are swollen  REVIEW OF SYSTEMS:  CONSTITUTIONAL: No fever, fatigue or weakness.  EYES: No blurred or double vision.  EARS, NOSE, AND THROAT: No tinnitus or ear pain.  RESPIRATORY: No cough, shortness of breath is better while resting, denies wheezing or hemoptysis.  CARDIOVASCULAR: No chest pain, orthopnea, edema.  GASTROINTESTINAL: No nausea, vomiting, diarrhea or abdominal pain.  GENITOURINARY: No dysuria, hematuria.  ENDOCRINE: No polyuria, nocturia,  HEMATOLOGY: No anemia, easy bruising or bleeding SKIN: No rash or lesion.  Reporting lower extremity swelling MUSCULOSKELETAL: No joint pain or arthritis.   NEUROLOGIC: No tingling, numbness, weakness.  PSYCHIATRY: No anxiety or depression.   DRUG ALLERGIES:   Allergies  Allergen Reactions  . Spironolactone Other (See Comments)    Hyperkalemia  . Entresto [Sacubitril-Valsartan] Diarrhea  . Ziac [Bisoprolol-Hydrochlorothiazide]     VITALS:  Blood pressure (!) 134/109, pulse 71, temperature (!) 97.3 F (36.3 C), temperature source Oral, resp. rate 18, height 5\' 10"  (1.778 m), weight 81.7 kg (180 lb 3.2 oz), SpO2 94 %.  PHYSICAL EXAMINATION:  GENERAL:  68 y.o.-year-old patient lying in the bed with no acute distress.  EYES: Pupils equal, round, reactive to light and accommodation. No scleral icterus. Extraocular muscles intact.  HEENT: Head atraumatic, normocephalic. Oropharynx and nasopharynx clear.  NECK:  Supple, no jugular venous distention. No thyroid enlargement, no tenderness.  LUNGS: Moderate breath sounds bilaterally, no wheezing, has bilateral rales,rhonchi No use of accessory muscles of respiration.   CARDIOVASCULAR: S1, S2 normal. No murmurs, rubs, or gallops.  ABDOMEN: Soft, nontender, nondistended. Bowel sounds present. No organomegaly or mass.  EXTREMITIES: 2+ pedal edema, no cyanosis, or clubbing.  NEUROLOGIC: Cranial nerves II through XII are intact. Muscle strength at his baseline in all extremities. Sensation intact. Gait not checked.  PSYCHIATRIC: The patient is alert and oriented x 3.  SKIN: No obvious rash, lesion, or ulcer.    LABORATORY PANEL:   CBC Recent Labs  Lab 01/23/18 0916  WBC 6.8  HGB 18.4*  HCT 55.0*  PLT 158   ------------------------------------------------------------------------------------------------------------------  Chemistries  Recent Labs  Lab 01/23/18 0916  01/24/18 0008  NA 132*  --  134*  K 2.8*   < > 3.1*  CL 93*  --  99*  CO2 26  --  24  GLUCOSE 104*  --  99  BUN 22*  --  20  CREATININE 1.99*  --  1.69*  CALCIUM 9.4  --  9.0  MG 2.1  --   --   AST 51*  --   --   ALT 24  --   --   ALKPHOS 145*  --   --   BILITOT 1.7*  --   --    < > = values in this interval not displayed.   ------------------------------------------------------------------------------------------------------------------  Cardiac Enzymes Recent Labs  Lab 01/24/18 0008  TROPONINI 0.06*   ------------------------------------------------------------------------------------------------------------------  RADIOLOGY:  Dg Chest 2 View  Result Date: 01/23/2018 CLINICAL DATA:  Cough and shortness of breath EXAM: CHEST  2 VIEW COMPARISON:  November 07, 2017 FINDINGS: Small bilateral pleural effusions with underlying atelectasis. Mild pulmonary venous congestion without overt edema. Stable AICD device. Cardiomegaly. No other changes.  IMPRESSION: Cardiomegaly, pulmonary venous congestion, and small bilateral pleural effusions. Electronically Signed   By: Chris Nguyen III M.D   On: 01/23/2018 10:07    EKG:   Orders placed or performed during the hospital  encounter of 01/23/18  . EKG 12-Lead  . EKG 12-Lead  . ED EKG  . ED EKG    ASSESSMENT AND PLAN:   1 acute on chronic systolic and diastolic congestive heart failure exacerbation with Status post AICD  congestive heart failure protocol spironolactone twice daily given severe hypokalemia  IV Lasix daily, strict I&O monitoring, daily weights, lisinopril, Coreg, aspirin  check lipids in the morning  repeat echocardiogram Most recent echocardiogram noted for ejection fraction 20-25% with stage I diastolic dysfunction Cardiology Dr. Clayborn Nguyen is following  2 acute severe hypokalemia Did receive IV and p.o. potassium in the emergency room,  started  Spironolactone twice daily, repeat potassium level   check magnesium level, BMP in the morning  3 chronic hyperlipidemia, unspecified Continue statin therapy and check lipids in the morning  4 history of coronary artery disease status post CABG Continue aspirin, statin therapy, lisinopril, Coreg, and continue close medical monitoring  5 history of BPH Stable Continue conservative management/observation  6.  Hypothyroidism TSH elevated Patient is started on Synthroid 25 mcg by primary care physician  last Wednesday Continue the same and PCP to repeat thyroid tests in 6-8 weeks       All the records are reviewed and case discussed with Care Management/Social Workerr. Management plans discussed with the patient, family and they are in agreement.  CODE STATUS: fc   TOTAL TIME TAKING CARE OF THIS PATIENT: 36  minutes.   POSSIBLE D/C IN 2  DAYS, DEPENDING ON CLINICAL CONDITION.  Note: This dictation was prepared with Dragon dictation along with smaller phrase technology. Any transcriptional errors that result from this process are unintentional.   Chris Nguyen M.D on 01/24/2018 at 2:52 PM  Between 7am to 6pm - Pager - [phone] After 6pm go to [email or web address] - password [email or web address] [facility]  Office   [phone]  CC: Primary care physician; Chris Sons, MD

## 2018-01-24 NOTE — Evaluation (Signed)
Physical Therapy Evaluation Patient Details Name: Chris Nguyen MRN: [id number] DOB: 08-Sep-1951 Today's Date: 01/24/2018   History of Present Illness  67 yo male with onset of SOB and exertional distress was admitted and discovered elevated troponin, low K+, PMHx:  CAD, CABG, AICD, CHF, cardiac arrest, HLD,   Clinical Impression  Pt is demonstrating some good effort to walk on [facility] and will progress him to RW or no AD depending on balance control.  He is able to walk with no big drops in O2 sat so will continue to monitor this for home transitition.  Follow acutely for these goals.    Follow Up Recommendations Home health PT;Supervision/Assistance - 24 hour;Supervision for mobility/OOB    Equipment Recommendations  Rolling walker with 5" wheels    Recommendations for Other Services       Precautions / Restrictions Precautions Precautions: Fall(telemetry) Precaution Comments: ck O2 sats Restrictions Weight Bearing Restrictions: No      Mobility  Bed Mobility Overal bed mobility: Needs Assistance Bed Mobility: Supine to Sit;Sit to Supine     Supine to sit: Min guard Sit to supine: Min guard   General bed mobility comments: Pt is using bed rail and good body mechanics to assist with transition, up to [facility]  Transfers Overall transfer level: Needs assistance Equipment used: Straight cane;1 person hand held assist Transfers: Sit to/from Stand Sit to Stand: Min guard         General transfer comment: min guard for all transfers with [facility]  Ambulation/Gait Ambulation/Gait assistance: Min guard(only to the door and back ) Ambulation Distance (Feet): 30 Feet Assistive device: 1 person hand held assist;Straight cane Gait Pattern/deviations: Step-to pattern;Step-through pattern;Decreased stride length;Wide base of support;Trunk flexed Gait velocity: reduced Gait velocity interpretation: Below normal speed for age/gender General Gait Details: O2 sats were  acceptable.  Stairs            Wheelchair Mobility    Modified Rankin (Stroke Patients Only)       Balance Overall balance assessment: Mild deficits observed, not formally tested                                           Pertinent Vitals/Pain Pain Assessment: No/denies pain    Home Living Family/patient expects to be discharged to:: Private residence Living Arrangements: Spouse/significant other Available Help at Discharge: Family;Available 24 hours/day Type of Home: House Home Access: Stairs to enter Entrance Stairs-Rails: Right;Left;Can reach both Entrance Stairs-Number of Steps: 2   Home Equipment: Walker - 2 wheels;Walker - 4 wheels;Walker - standard;Cane - single point      Prior Function Level of Independence: Independent with assistive device(s)         Comments: was I on SPC but did not use at times     Hand Dominance        Extremity/Trunk Assessment   Upper Extremity Assessment Upper Extremity Assessment: Overall WFL for tasks assessed    Lower Extremity Assessment Lower Extremity Assessment: Generalized weakness    Cervical / Trunk Assessment Cervical / Trunk Assessment: Normal  Communication   Communication: No difficulties  Cognition Arousal/Alertness: Awake/alert Behavior During Therapy: WFL for tasks assessed/performed Overall Cognitive Status: Within Functional Limits for tasks assessed  General Comments General comments (skin integrity, edema, etc.): Pt is more stable with SPC but does not attempt to reach for the furniture in the room as compensation without the cane    Exercises     Assessment/Plan    PT Assessment Patient needs continued PT services  PT Problem List Decreased strength;Decreased range of motion;Decreased activity tolerance;Decreased balance;Decreased mobility;Decreased coordination;Decreased cognition;Decreased knowledge of use of  DME;Decreased safety awareness;Cardiopulmonary status limiting activity;Obesity       PT Treatment Interventions DME instruction;Gait training;Stair training;Functional mobility training;Therapeutic activities;Therapeutic exercise;Balance training;Neuromuscular re-education;Patient/family education;Cognitive remediation    PT Goals (Current goals can be found in the Care Plan section)  Acute Rehab PT Goals Patient Stated Goal: to get home with no therapy as he had issue with last PT  PT Goal Formulation: With patient Time For Goal Achievement: 01/31/18 Potential to Achieve Goals: Good    Frequency Min 2X/week   Barriers to discharge Other (comment) had O2 sats for walks with no drop below 95%    Co-evaluation               AM-PAC PT "6 Clicks" Daily Activity  Outcome Measure Difficulty turning over in bed (including adjusting bedclothes, sheets and blankets)?: Unable Difficulty moving from lying on back to sitting on the side of the bed? : Unable Difficulty sitting down on and standing up from a chair with arms (e.g., wheelchair, bedside commode, etc,.)?: Unable Help needed moving to and from a bed to chair (including a wheelchair)?: A Little Help needed walking in hospital room?: A Little Help needed climbing 3-5 steps with a railing? : A Lot 6 Click Score: 11    End of Session Equipment Utilized During Treatment: Oxygen;Gait belt Activity Tolerance: Patient tolerated treatment well;Patient limited by fatigue;Treatment limited secondary to agitation Patient left: in bed;with call bell/phone within reach;with bed alarm set Nurse Communication: Mobility status PT Visit Diagnosis: Unsteadiness on feet (R26.81);Other abnormalities of gait and mobility (R26.89);Repeated falls (R29.6);History of falling (Z91.81);Muscle weakness (generalized) (M62.81);Difficulty in walking, not elsewhere classified (R26.2);Adult, failure to thrive (R62.7)    Time: 5498-2641 PT Time Calculation  (min) (ACUTE ONLY): 33 min   Charges:   PT Evaluation $PT Eval Moderate Complexity: 1 Mod PT Treatments $Gait Training: 8-22 mins   PT G Codes:   PT G-Codes **NOT FOR INPATIENT CLASS** Functional Assessment Tool Used: AM-PAC 6 Clicks Basic Mobility    Ramond Dial 01/24/2018, 4:22 PM  Mee Hives, PT MS Acute Rehab Dept. Number: [place] [facility] [place]

## 2018-01-25 LAB — MAGNESIUM: MAGNESIUM: 2.1 mg/dL (ref 1.7–2.4)

## 2018-01-25 LAB — CBC
HCT: 55.2 % — ABNORMAL HIGH (ref 40.0–52.0)
HEMOGLOBIN: 18.3 g/dL — AB (ref 13.0–18.0)
MCH: 30.6 pg (ref 26.0–34.0)
MCHC: 33.1 g/dL (ref 32.0–36.0)
MCV: 92.2 fL (ref 80.0–100.0)
PLATELETS: 126 10*3/uL — AB (ref 150–440)
RBC: 5.99 MIL/uL — ABNORMAL HIGH (ref 4.40–5.90)
RDW: 15.5 % — ABNORMAL HIGH (ref 11.5–14.5)
WBC: 7.8 10*3/uL (ref 3.8–10.6)

## 2018-01-25 LAB — LIPID PANEL
CHOL/HDL RATIO: 3.5 ratio
CHOLESTEROL: 215 mg/dL — AB (ref 0–200)
HDL: 62 mg/dL (ref >40)
LDL Cholesterol: 142 mg/dL — ABNORMAL HIGH (ref 0–99)
Triglycerides: 57 mg/dL (ref <150)
VLDL: 11 mg/dL (ref 0–40)

## 2018-01-25 LAB — BASIC METABOLIC PANEL
Anion gap: 15 (ref 5–15)
BUN: 27 mg/dL — AB (ref 6–20)
CO2: 23 mmol/L (ref 22–32)
Calcium: 9.3 mg/dL (ref 8.9–10.3)
Chloride: 89 mmol/L — ABNORMAL LOW (ref 101–111)
Creatinine, Ser: 2.33 mg/dL — ABNORMAL HIGH (ref 0.61–1.24)
GFR calc Af Amer: 32 mL/min — ABNORMAL LOW (ref >60)
GFR, EST NON AFRICAN AMERICAN: 27 mL/min — AB (ref >60)
Glucose, Bld: 72 mg/dL (ref 65–99)
POTASSIUM: 4.4 mmol/L (ref 3.5–5.1)
SODIUM: 127 mmol/L — AB (ref 135–145)

## 2018-01-25 LAB — T4, FREE: FREE T4: 0.76 ng/dL (ref 0.61–1.12)

## 2018-01-25 MED ORDER — FUROSEMIDE 10 MG/ML IJ SOLN
20.0000 mg | Freq: Two times a day (BID) | INTRAMUSCULAR | Status: DC
  Administered 2018-01-25 – 2018-01-26 (×2): 20 mg via INTRAVENOUS
  Filled 2018-01-25 (×2): qty 2

## 2018-01-25 NOTE — Progress Notes (Signed)
Pt. Has catnapped off and on throughout the night with no c/o SOB, pain or acute distress observed.

## 2018-01-25 NOTE — Progress Notes (Signed)
67 year old male with multiple medical problems.  Patient with ICM with EF of 20-25%, CAD, hx of CABG, chronic systolic CHF, cardiac arrest with subsequent AICD implantation, Mitral valve repair, HLD, HTN and paroxysmal atrial fibrillation.  Patient c/o progressive SOB with PND and orthopnea the last week.  As an outpatient patient's lasix was increased without improvement.  Because of worsening SOB, cough,  and lower extremity edema patient presented to the ER.    CHF Education:?? Educational session with patient completed.??  Reviewed "Living Better with Heart Failure" packet with patient. Briefly reviewed definition of heart failure and signs and symptoms of an exacerbation.?Discussed the meaning of EF and his value as compared to normal value.  Explained to patient that HF is a chronic illness which requires self-assessment / self-management along with help from the cardiologist/PCP/HF Clinic.???  *Reviewed importance of and reason behind checking weight daily in the AM, after using the bathroom, but before getting dressed.?Patient has scales.  Patient reports that he weighs himself every day.  Patient does not write his weights down, as he states he is able to remember the weights.  Patient informed this RN that he contacted his PCP when he realized he was no longer able to play 4-5 games of pool in a day at the pool hall, which is his norm.  He was limited to 2 games of pool due to his SOB, fatigue, and weight gain.  When the additional lasix ordered by his PCP did not work, patient presented to the ER.   ? Reviewed the following information with patient:  *Discussed when to call the Dr= weight gain of >2-3lb overnight of 5lb in a week,  *Discussed yellow zone= call MD: weight gain of >2-3lb overnight of 5lb in a week, increased swelling, increased SOB when lying down, chest discomfort, dizziness, increased fatigue *Red Zone= call 911: struggle to breath, fainting or near fainting, significant  chest pain  *Reviewed low sodium diet-provided handout of recommended and not recommended foods. ?Reviewed reading labels with patient. Discussed fluid intake with patient as well. Patient not currently on a fluid restriction, but advised no more than 8-8 ounces glass of fluids per day.?Note: ?Patient does not want to see dietitian, as he has been on a low sodium heart healthy diet for some time.    *Instructed patient to take medications as prescribed for heart failure. Explained briefly why pt is on the medications (either make you feel better, live longer or keep you out of the hospital) and discussed monitoring and side effects.  Patient informed this RN that he was on several medications - he felt too many medications - for which he stopped taking some of them.  He could not tell me specifically which medications he stopped.  One he described as being for his fast rate.  Patient informed this RN that he did not have a fast rate, but a low heart rate.  Patient stated, "Then I got into trouble and became  more SOB, increase in swelling, and worsening fatigue."  This RN stressed the importance of taking all medications as prescribed and not stopping any medications without discussing with Dr. Clayborn Bigness first.  Patient verbalized understanding.    *Smoking Cessation?- Patient is a former smoker.?Patient reported to this RN that he quit smoking 13 years ago.   ? *Exercise - Physical Therapy is recommending HH with PT and 24 hour supervision.  Patient encouraged to remain as active as possible.?? ? *[facility]- Explained the  role of the [facility].  Explained to patient that the HF Clinic does not replace his PCP/cardiologist, but is an additional resource to help him manage his HF and to keep him out of the hospital. ?Patient reported that he is involved in a HF / Cardiac Study at [facility] right now.  Patient stated that he does not want to be followed in another clinic nor does he  have the money to make another co-pay.  Patient requesting that I cancel the appointment in the HF Clinic.  Per patient's request, the appointment in the HF Clinic was cancelled.      The 5 Steps to Living Better with Heart Failure were reviewed with patient.   Roanna Epley, RN, BSN, [facility] Cardiovascular and Pulmonary Nurse Navigator

## 2018-01-25 NOTE — Progress Notes (Signed)
Family Meeting Note  Advance Directive:yes  Today a meeting took place with the Patient, wife at bedside     The following clinical team members were present during this meeting:MD  The following were discussed:Patient's diagnosis: Acute on chronic CHF systolic CHF exacerbation, hypokalemia, chronic hyperlipidemia, coronary artery disease, benign prostatic hypertrophy, hypothyroidism and treatment plan discussed  patient's progosis: Unable to determine and Goals for treatment: Full Code.,  Wife is the healthcare power of attorney  Additional follow-up to be provided: Hospitalist, cardiology  Time spent during discussion:18 min  Nicholes Mango, MD

## 2018-01-25 NOTE — Progress Notes (Signed)
[place] at [place] NAME: Chris Nguyen    MR#:  [id number]  DATE OF BIRTH:  Feb 11, 1951  SUBJECTIVE:  CHIEF COMPLAINT: Shortness of breath is  better.  Out of bed to chair patient is started on Synthroid 25 mcg by primary care physician on last Wednesday.  Wife at bedside  REVIEW OF SYSTEMS:  CONSTITUTIONAL: No fever, fatigue or weakness.  EYES: No blurred or double vision.  EARS, NOSE, AND THROAT: No tinnitus or ear pain.  RESPIRATORY: No cough, shortness of breath is better while resting, denies wheezing or hemoptysis.  CARDIOVASCULAR: No chest pain, orthopnea, edema.  GASTROINTESTINAL: No nausea, vomiting, diarrhea or abdominal pain.  GENITOURINARY: No dysuria, hematuria.  ENDOCRINE: No polyuria, nocturia,  HEMATOLOGY: No anemia, easy bruising or bleeding SKIN: No rash or lesion.  Reporting lower extremity swelling MUSCULOSKELETAL: No joint pain or arthritis.   NEUROLOGIC: No tingling, numbness, weakness.  PSYCHIATRY: No anxiety or depression.   DRUG ALLERGIES:   Allergies  Allergen Reactions  . Spironolactone Other (See Comments)    Hyperkalemia  . Entresto [Sacubitril-Valsartan] Diarrhea  . Ziac [Bisoprolol-Hydrochlorothiazide]     VITALS:  Blood pressure 136/87, pulse 67, temperature 98.2 F (36.8 C), temperature source Oral, resp. rate 20, height 5\' 10"  (1.778 m), weight 82.8 kg (182 lb 9.6 oz), SpO2 95 %.  PHYSICAL EXAMINATION:  GENERAL:  67 y.o.-year-old patient lying in the bed with no acute distress.  EYES: Pupils equal, round, reactive to light and accommodation. No scleral icterus. Extraocular muscles intact.  HEENT: Head atraumatic, normocephalic. Oropharynx and nasopharynx clear.  NECK:  Supple, no jugular venous distention. No thyroid enlargement, no tenderness.  LUNGS: Moderate breath sounds bilaterally, no wheezing, has bilateral rales,rhonchi No use of accessory muscles of respiration.  CARDIOVASCULAR:  S1, S2 normal. No murmurs, rubs, or gallops.  ABDOMEN: Soft, nontender, nondistended. Bowel sounds present. No organomegaly or mass.  EXTREMITIES: 2+ pedal edema, no cyanosis, or clubbing.  NEUROLOGIC: Cranial nerves II through XII are intact. Muscle strength at his baseline in all extremities. Sensation intact. Gait not checked.  PSYCHIATRIC: The patient is alert and oriented x 3.  SKIN: No obvious rash, lesion, or ulcer.    LABORATORY PANEL:   CBC Recent Labs  Lab 01/25/18 0513  WBC 7.8  HGB 18.3*  HCT 55.2*  PLT 126*   ------------------------------------------------------------------------------------------------------------------  Chemistries  Recent Labs  Lab 01/23/18 0916  01/25/18 0513  NA 132*   < > 127*  K 2.8*   < > 4.4  CL 93*   < > 89*  CO2 26   < > 23  GLUCOSE 104*   < > 72  BUN 22*   < > 27*  CREATININE 1.99*   < > 2.33*  CALCIUM 9.4   < > 9.3  MG 2.1  --  2.1  AST 51*  --   --   ALT 24  --   --   ALKPHOS 145*  --   --   BILITOT 1.7*  --   --    < > = values in this interval not displayed.   ------------------------------------------------------------------------------------------------------------------  Cardiac Enzymes Recent Labs  Lab 01/24/18 0008  TROPONINI 0.06*   ------------------------------------------------------------------------------------------------------------------  RADIOLOGY:  No results found.  EKG:   Orders placed or performed during the hospital encounter of 01/23/18  . EKG 12-Lead  . EKG 12-Lead  . ED EKG  . ED EKG    ASSESSMENT AND PLAN:  1 acute on chronic systolic and diastolic congestive heart failure exacerbation with Status post AICD  congestive heart failure protocol spironolactone twice daily given severe hypokalemia  IV Lasix daily, strict I&O monitoring, daily weights, lisinopril, Coreg, aspirin LDL 142-statin  repeat echocardiogram revealed 20-25% left ventricular ejection fraction, global  akinesis, mild to moderate pulmonary hypertension, severe tricuspid regurgitation no cardiac source of emboli Most recent echocardiogram noted for ejection fraction 20-25% with stage I diastolic dysfunction Cardiology Dr. Clayborn Bigness is following  2 acute severe hypokalemia Resolved Did receive IV and p.o. potassium in the emergency room,  started on Spironolactone twice daily, repeat potassium level   magnesium 2.1 Check BMP in a.m.  3 chronic hyperlipidemia, unspecified Continue statin therapy and LDL at 142  4 history of coronary artery disease status post CABG Continue aspirin, statin therapy, lisinopril, Coreg, and continue close medical monitoring  5 history of BPH Stable Continue conservative management/observation  6.  Hypothyroidism TSH elevated, free T4 in the normal range Patient is started on Synthroid 25 mcg by primary care physician  last Wednesday Continue the same and PCP to repeat thyroid tests in 6-8 weeks    PT is recommending home health PT and rolling walker with 5 inch wheels   All the records are reviewed and case discussed with Care Management/Social Workerr. Management plans discussed with the patient, family and they are in agreement.  CODE STATUS: fc   TOTAL TIME TAKING CARE OF THIS PATIENT: 36  minutes.   POSSIBLE D/C IN 2  DAYS, DEPENDING ON CLINICAL CONDITION.  Note: This dictation was prepared with Dragon dictation along with smaller phrase technology. Any transcriptional errors that result from this process are unintentional.   Nicholes Mango M.D on 01/25/2018 at 3:09 PM  Between 7am to 6pm - Pager - [phone] After 6pm go to [email or web address] - password [email or web address] [facility]  Office  [phone]  CC: Primary care physician; Birdie Sons, MD

## 2018-01-25 NOTE — Progress Notes (Signed)
SATURATION QUALIFICATIONS: (This note is used to comply with regulatory documentation for home oxygen)  Patient Saturations on Room Air at Rest = 84%  Patient Saturations on Room Air while Ambulating = 82%  Patient Saturations on  2  Liters of oxygen while Ambulating =94%  Please briefly explain why patient needs home oxygen:

## 2018-01-25 NOTE — Plan of Care (Signed)
  Education: Knowledge of General Education information will improve 01/25/2018 1220 - Progressing by Darrelyn Hillock, RN

## 2018-01-26 LAB — BASIC METABOLIC PANEL
Anion gap: 12 (ref 5–15)
BUN: 36 mg/dL — ABNORMAL HIGH (ref 6–20)
CO2: 26 mmol/L (ref 22–32)
Calcium: 9.4 mg/dL (ref 8.9–10.3)
Chloride: 91 mmol/L — ABNORMAL LOW (ref 101–111)
Creatinine, Ser: 2.91 mg/dL — ABNORMAL HIGH (ref 0.61–1.24)
GFR calc Af Amer: 24 mL/min — ABNORMAL LOW (ref >60)
GFR, EST NON AFRICAN AMERICAN: 21 mL/min — AB (ref >60)
GLUCOSE: 83 mg/dL (ref 65–99)
Potassium: 4.1 mmol/L (ref 3.5–5.1)
Sodium: 129 mmol/L — ABNORMAL LOW (ref 135–145)

## 2018-01-26 LAB — T3: T3 TOTAL: 63 ng/dL — AB (ref 71–180)

## 2018-01-26 MED ORDER — FLUTICASONE PROPIONATE 50 MCG/ACT NA SUSP
1.0000 | Freq: Every day | NASAL | Status: DC
  Administered 2018-01-26 – 2018-01-29 (×4): 1 via NASAL
  Filled 2018-01-26: qty 16

## 2018-01-26 MED ORDER — TRAZODONE HCL 50 MG PO TABS
25.0000 mg | ORAL_TABLET | Freq: Every evening | ORAL | Status: DC | PRN
  Administered 2018-01-26 – 2018-01-29 (×2): 25 mg via ORAL
  Filled 2018-01-26 (×3): qty 1

## 2018-01-26 MED ORDER — ALBUTEROL SULFATE (2.5 MG/3ML) 0.083% IN NEBU
2.5000 mg | INHALATION_SOLUTION | RESPIRATORY_TRACT | Status: DC | PRN
Start: 2018-01-26 — End: 2018-01-28
  Administered 2018-01-26 – 2018-01-27 (×2): 2.5 mg via RESPIRATORY_TRACT
  Filled 2018-01-26 (×3): qty 3

## 2018-01-26 MED ORDER — FUROSEMIDE 10 MG/ML IJ SOLN
20.0000 mg | Freq: Once | INTRAMUSCULAR | Status: AC
  Administered 2018-01-26: 20 mg via INTRAVENOUS
  Filled 2018-01-26: qty 2

## 2018-01-26 NOTE — Care Management (Signed)
Patient is agreeable to have home health nurse and no agency preference.  Patient lives with his wife, but they often live in separate home.  At discharge he will be going to his wife's home "for a while."  He qualified 2/25 for home 02.  He has access to scales.

## 2018-01-26 NOTE — Progress Notes (Signed)
PT Cancellation Note  Patient Details Name: Chris Nguyen MRN: [id number] DOB: 11-10-1951   Cancelled Treatment:    Reason Eval/Treat Not Completed: Patient declined, no reason specified. Treatment attempted; pt refused and requested waiting until tomorrow. Re attempt tomorrow.    Larae Grooms, PTA 01/26/2018, 5:49 PM

## 2018-01-26 NOTE — Progress Notes (Signed)
Talked to Dr. Margaretmary Eddy about patient's complaints of sinus headache, order for flonase given. Also talked about order to wean patient to room air, patient did qualify for home oxygen yesterday, will discontinue order. RN will continue to monitor.

## 2018-01-26 NOTE — Progress Notes (Signed)
Talked to Dr. Margaretmary Eddy about patient;s episode of wheeziness and is reporting shortness of breath, order to give 20 IV lasix and albuterol PRN. No other concern at the moment. RN will continue to monitor.

## 2018-01-26 NOTE — Progress Notes (Addendum)
[place] at [place] NAME: Chris Nguyen    MR#:  [id number]  DATE OF BIRTH:  Jun 04, 1951  SUBJECTIVE:  CHIEF COMPLAINT: Shortness of breath is  better.  Out of bed to chair patient is started on Synthroid 25 mcg by primary care physician on last Wednesday.    REVIEW OF SYSTEMS:  CONSTITUTIONAL: No fever, fatigue or weakness.  EYES: No blurred or double vision.  EARS, NOSE, AND THROAT: No tinnitus or ear pain.  RESPIRATORY: No cough, shortness of breath is better while resting, denies wheezing or hemoptysis.  CARDIOVASCULAR: No chest pain, orthopnea, edema.  GASTROINTESTINAL: No nausea, vomiting, diarrhea or abdominal pain.  GENITOURINARY: No dysuria, hematuria.  ENDOCRINE: No polyuria, nocturia,  HEMATOLOGY: No anemia, easy bruising or bleeding SKIN: No rash or lesion.  Reporting lower extremity swelling MUSCULOSKELETAL: No joint pain or arthritis.   NEUROLOGIC: No tingling, numbness, weakness.  PSYCHIATRY: No anxiety or depression.   DRUG ALLERGIES:   Allergies  Allergen Reactions  . Spironolactone Other (See Comments)    Hyperkalemia  . Entresto [Sacubitril-Valsartan] Diarrhea  . Ziac [Bisoprolol-Hydrochlorothiazide]     VITALS:  Blood pressure 95/76, pulse (!) 53, temperature 98.3 F (36.8 C), temperature source Oral, resp. rate 17, height 5\' 10"  (1.778 m), weight 84.4 kg (186 lb 1.6 oz), SpO2 100 %.  PHYSICAL EXAMINATION:  GENERAL:  67 y.o.-year-old patient lying in the bed with no acute distress.  EYES: Pupils equal, round, reactive to light and accommodation. No scleral icterus. Extraocular muscles intact.  HEENT: Head atraumatic, normocephalic. Oropharynx and nasopharynx clear.  NECK:  Supple, no jugular venous distention. No thyroid enlargement, no tenderness.  LUNGS: Moderate breath sounds bilaterally, no wheezing, has bilateral rales,rhonchi No use of accessory muscles of respiration.  CARDIOVASCULAR: S1, S2  normal. No murmurs, rubs, or gallops.  ABDOMEN: Soft, nontender, nondistended. Bowel sounds present. No organomegaly or mass.  EXTREMITIES: 2+ pedal edema, no cyanosis, or clubbing.  NEUROLOGIC: Cranial nerves II through XII are intact. Muscle strength at his baseline in all extremities. Sensation intact. Gait not checked.  PSYCHIATRIC: The patient is alert and oriented x 3.  SKIN: No obvious rash, lesion, or ulcer.    LABORATORY PANEL:   CBC Recent Labs  Lab 01/25/18 0513  WBC 7.8  HGB 18.3*  HCT 55.2*  PLT 126*   ------------------------------------------------------------------------------------------------------------------  Chemistries  Recent Labs  Lab 01/23/18 0916  01/25/18 0513 01/26/18 0359  NA 132*   < > 127* 129*  K 2.8*   < > 4.4 4.1  CL 93*   < > 89* 91*  CO2 26   < > 23 26  GLUCOSE 104*   < > 72 83  BUN 22*   < > 27* 36*  CREATININE 1.99*   < > 2.33* 2.91*  CALCIUM 9.4   < > 9.3 9.4  MG 2.1  --  2.1  --   AST 51*  --   --   --   ALT 24  --   --   --   ALKPHOS 145*  --   --   --   BILITOT 1.7*  --   --   --    < > = values in this interval not displayed.   ------------------------------------------------------------------------------------------------------------------  Cardiac Enzymes Recent Labs  Lab 01/24/18 0008  TROPONINI 0.06*   ------------------------------------------------------------------------------------------------------------------  RADIOLOGY:  No results found.  EKG:   Orders placed or performed during the hospital encounter of 01/23/18  .  EKG 12-Lead  . EKG 12-Lead  . ED EKG  . ED EKG    ASSESSMENT AND PLAN:   1 acute on chronic systolic and diastolic congestive heart failure exacerbation with Status post AICD  congestive heart failure protocol spironolactone twice daily discontinued in view of acute kidney injury Lasix is on hold and lisinopril is on hold in view of AK I  strict I&O monitoring, daily weights,   Coreg, aspirin LDL 142-statin  repeat echocardiogram revealed 20-25% left ventricular ejection fraction, global akinesis, mild to moderate pulmonary hypertension, severe tricuspid regurgitation no cardiac source of emboli Most recent echocardiogram noted for ejection fraction 20-25% with stage I diastolic dysfunction Cardiology Dr. Clayborn Bigness is following  2  acute kidney injury  Hold lisinopril, Lasix, spironolactone Check a.m. Labs  creatinine 1.70 seems to be his baseline creatinine during this admission 1.69-2.33-2.9,   3 chronic hyperlipidemia, unspecified Continue statin therapy and LDL at 142  4 history of coronary artery disease status post CABG Continue aspirin, statin therapy, Coreg, and continue close medical monitoring Holding lisinopril in view of AK I  5 history of BPH Stable Continue conservative management/observation  6.  Hypothyroidism TSH elevated, free T4 in the normal range Patient is started on Synthroid 25 mcg by primary care physician  last Wednesday Continue the same and PCP to repeat thyroid tests in 6-8 weeks    PT is recommending home health PT and rolling walker with 5 inch wheels   All the records are reviewed and case discussed with Care Management/Social Workerr. Management plans discussed with the patient, family and they are in agreement.  CODE STATUS: fc   TOTAL TIME TAKING CARE OF THIS PATIENT: 36  minutes.   POSSIBLE D/C IN 2  DAYS, DEPENDING ON CLINICAL CONDITION.  Note: This dictation was prepared with Dragon dictation along with smaller phrase technology. Any transcriptional errors that result from this process are unintentional.   Nicholes Mango M.D on 01/26/2018 at 2:46 PM  Between 7am to 6pm - Pager - [phone] After 6pm go to [email or web address] - password [email or web address] [facility]  Office  [phone]  CC: Primary care physician; Birdie Sons, MD

## 2018-01-26 NOTE — Progress Notes (Signed)
Talked to Dr. Jannifer Franklin about patient's complaints of unable to sleep, trazadone order given. RN will continue to monitor.

## 2018-01-27 ENCOUNTER — Encounter: Payer: Self-pay | Admitting: *Deleted

## 2018-01-27 ENCOUNTER — Inpatient Hospital Stay: Payer: Medicare HMO

## 2018-01-27 LAB — BASIC METABOLIC PANEL
ANION GAP: 13 (ref 5–15)
BUN: 36 mg/dL — ABNORMAL HIGH (ref 6–20)
CHLORIDE: 90 mmol/L — AB (ref 101–111)
CO2: 23 mmol/L (ref 22–32)
Calcium: 9 mg/dL (ref 8.9–10.3)
Creatinine, Ser: 2.75 mg/dL — ABNORMAL HIGH (ref 0.61–1.24)
GFR calc Af Amer: 26 mL/min — ABNORMAL LOW (ref >60)
GFR calc non Af Amer: 22 mL/min — ABNORMAL LOW (ref >60)
Glucose, Bld: 92 mg/dL (ref 65–99)
Potassium: 3.8 mmol/L (ref 3.5–5.1)
SODIUM: 126 mmol/L — AB (ref 135–145)

## 2018-01-27 MED ORDER — FUROSEMIDE 10 MG/ML IJ SOLN
20.0000 mg | Freq: Two times a day (BID) | INTRAMUSCULAR | Status: DC
  Administered 2018-01-27 – 2018-01-28 (×3): 20 mg via INTRAVENOUS
  Filled 2018-01-27 (×3): qty 2

## 2018-01-27 MED ORDER — DIPHENHYDRAMINE HCL 25 MG PO CAPS
25.0000 mg | ORAL_CAPSULE | Freq: Four times a day (QID) | ORAL | Status: DC | PRN
  Administered 2018-01-27 – 2018-01-28 (×2): 25 mg via ORAL
  Filled 2018-01-27 (×2): qty 1

## 2018-01-27 NOTE — Progress Notes (Signed)
[place] at [place] NAME: Chris Nguyen    MR#:  [id number]  DATE OF BIRTH:  1951/02/04  SUBJECTIVE:  CHIEF COMPLAINT: Shortness of breath is  better.  Patient has chronic sinus problem and need to get surgery ,out of bed to chair patient is started on Synthroid 25 mcg by primary care physician on last Wednesday.    REVIEW OF SYSTEMS:  CONSTITUTIONAL: No fever, fatigue or weakness.  EYES: No blurred or double vision.  EARS, NOSE, AND THROAT: No tinnitus or ear pain.  RESPIRATORY: No cough, shortness of breath is better while resting, denies wheezing or hemoptysis.  CARDIOVASCULAR: No chest pain, orthopnea, edema.  GASTROINTESTINAL: No nausea, vomiting, diarrhea or abdominal pain.  GENITOURINARY: No dysuria, hematuria.  ENDOCRINE: No polyuria, nocturia,  HEMATOLOGY: No anemia, easy bruising or bleeding SKIN: No rash or lesion.  Reporting lower extremity swelling MUSCULOSKELETAL: No joint pain or arthritis.   NEUROLOGIC: No tingling, numbness, weakness.  PSYCHIATRY: No anxiety or depression.   DRUG ALLERGIES:   Allergies  Allergen Reactions  . Spironolactone Other (See Comments)    Hyperkalemia  . Entresto [Sacubitril-Valsartan] Diarrhea  . Ziac [Bisoprolol-Hydrochlorothiazide]     VITALS:  Blood pressure 110/75, pulse (!) 59, temperature 98.2 F (36.8 C), temperature source Oral, resp. rate 18, height 5\' 10"  (1.778 m), weight 84.3 kg (185 lb 12.8 oz), SpO2 100 %.  PHYSICAL EXAMINATION:  GENERAL:  67 y.o.-year-old patient lying in the bed with no acute distress.  EYES: Pupils equal, round, reactive to light and accommodation. No scleral icterus. Extraocular muscles intact.  HEENT: Head atraumatic, normocephalic. Oropharynx and nasopharynx clear.  NECK:  Supple, no jugular venous distention. No thyroid enlargement, no tenderness.  LUNGS: Moderate breath sounds bilaterally, no wheezing, has bilateral rales,rhonchi No use of  accessory muscles of respiration.  CARDIOVASCULAR: S1, S2 normal. No murmurs, rubs, or gallops.  ABDOMEN: Soft, nontender, nondistended. Bowel sounds present. No organomegaly or mass.  EXTREMITIES: 2+ pedal edema, no cyanosis, or clubbing.  NEUROLOGIC: Cranial nerves II through XII are intact. Muscle strength at his baseline in all extremities. Sensation intact. Gait not checked.  PSYCHIATRIC: The patient is alert and oriented x 3.  SKIN: No obvious rash, lesion, or ulcer.    LABORATORY PANEL:   CBC Recent Labs  Lab 01/25/18 0513  WBC 7.8  HGB 18.3*  HCT 55.2*  PLT 126*   ------------------------------------------------------------------------------------------------------------------  Chemistries  Recent Labs  Lab 01/23/18 0916  01/25/18 0513  01/27/18 0548  NA 132*   < > 127*   < > 126*  K 2.8*   < > 4.4   < > 3.8  CL 93*   < > 89*   < > 90*  CO2 26   < > 23   < > 23  GLUCOSE 104*   < > 72   < > 92  BUN 22*   < > 27*   < > 36*  CREATININE 1.99*   < > 2.33*   < > 2.75*  CALCIUM 9.4   < > 9.3   < > 9.0  MG 2.1  --  2.1  --   --   AST 51*  --   --   --   --   ALT 24  --   --   --   --   ALKPHOS 145*  --   --   --   --   BILITOT 1.7*  --   --   --   --    < > =  values in this interval not displayed.   ------------------------------------------------------------------------------------------------------------------  Cardiac Enzymes Recent Labs  Lab 01/24/18 0008  TROPONINI 0.06*   ------------------------------------------------------------------------------------------------------------------  RADIOLOGY:  No results found.  EKG:   Orders placed or performed during the hospital encounter of 01/23/18  . EKG 12-Lead  . EKG 12-Lead  . ED EKG  . ED EKG    ASSESSMENT AND PLAN:   1 acute on chronic systolic and diastolic congestive heart failure exacerbation with Status post AICD  congestive heart failure protocol spironolactone twice daily, Lasix  discontinued in view of acute kidney injury lisinopril is on hold in view of AK I  strict I&O monitoring, daily weights,  Coreg, aspirin LDL 142-statin  repeat echocardiogram revealed 20-25% left ventricular ejection fraction, global akinesis, mild to moderate pulmonary hypertension, severe tricuspid regurgitation no cardiac source of emboli Most recent echocardiogram noted for ejection fraction 20-25% with stage I diastolic dysfunction Cardiology Dr. Clayborn Bigness is following  2  acute kidney injury with hyponatremia Hold lisinopril, Lasix, spironolactone.  Will consider giving him Demadex in future once renal function is improved Check a.m. Labs  creatinine 1.70 seems to be his baseline creatinine during this admission 1.69-2.33-2.9-2.75 Nephrology consulted  3 chronic hyperlipidemia, unspecified Continue statin therapy and LDL at 142  4 history of coronary artery disease status post CABG Continue aspirin, statin therapy, Coreg, and continue close medical monitoring Holding lisinopril in view of AK I  5 history of BPH Stable Continue conservative management/observation  6.  Hypothyroidism TSH elevated, free T4 in the normal range Patient is started on Synthroid 25 mcg by primary care physician  last Wednesday Continue the same and PCP to repeat thyroid tests in 6-8 weeks    PT is recommending home health PT and rolling walker with 5 inch wheels   All the records are reviewed and case discussed with Care Management/Social Workerr. Management plans discussed with the patient, family and they are in agreement.  CODE STATUS: fc   TOTAL TIME TAKING CARE OF THIS PATIENT: 36  minutes.   POSSIBLE D/C IN 2  DAYS, DEPENDING ON CLINICAL CONDITION.  Note: This dictation was prepared with Dragon dictation along with smaller phrase technology. Any transcriptional errors that result from this process are unintentional.   Nicholes Mango M.D on 01/27/2018 at 10:17 AM  Between 7am to 6pm  - Pager - [phone] After 6pm go to [email or web address] - password [email or web address] [facility]  Office  [phone]  CC: Primary care physician; Birdie Sons, MD

## 2018-01-27 NOTE — Progress Notes (Signed)
SATURATION QUALIFICATIONS: (This note is used to comply with regulatory documentation for home oxygen)  Patient Saturations on Room Air at Rest = 95%  Patient Saturations on Room Air while Ambulating = 88%  Patient Saturations on 2 Liters of oxygen while Ambulating = 98%

## 2018-01-27 NOTE — Consult Note (Signed)
[facility]  CONSULT NOTE    Date: 01/27/2018                  Patient Name:  Chris Nguyen  MRN: [id number]  DOB: 03-23-1951  Age / Sex: 67 y.o., male         PCP: Birdie Sons, MD                 Service Requesting Consult: Dr. Margaretmary Eddy                 Reason for Consult: Acute renal failure            History of Present Illness: Chris Nguyen is a 67 y.o. black male with hypertension, congestive heart failure, atrial fibrillation, hyperlipidemia, coronary artery disease status post CABG, AICD, BPH, mitral valve repair, who was admitted to [facility] on 01/23/2018 for Mixed hyperlipidemia [E78.2] Hypokalemia [E87.6] Essential hypertension [I10] Cardiomyopathy, unspecified type ([place]) [I42.9] Acute on chronic congestive heart failure, unspecified heart failure type ([place]) [I50.9]   Patient creatinine going from 1.69 on admission to 2.75. Found to have acute exacerbation of congestive heart failure.   Started on furosemide. Has never seen a nephrologist   Medications: Outpatient medications: Medications Prior to Admission  Medication Sig Dispense Refill Last Dose  . aspirin 81 MG chewable tablet Chew 81 mg by mouth daily.    Past Week at Unknown time  . furosemide (LASIX) 40 MG tablet Take 1 tablet (40 mg total) by mouth daily. (Patient taking differently: Take 40 mg by mouth 2 (two) times daily. ) 30 tablet 0 01/23/2018 at Unknown time  . levothyroxine (SYNTHROID, LEVOTHROID) 25 MCG tablet Take 1 tablet (25 mcg total) by mouth daily before breakfast. 30 tablet 0 01/22/2018 at Unknown time  . lisinopril (PRINIVIL,ZESTRIL) 20 MG tablet TAKE 1 TABLET EVERY DAY 90 tablet 4 01/23/2018 at Unknown time  . atorvastatin (LIPITOR) 80 MG tablet TAKE 1 TABLET EVERY DAY (Patient not taking: Reported on 11/05/2017) 90 tablet 3 Not Taking at Unknown time  . metoprolol succinate (TOPROL-XL) 50 MG 24 hr tablet Take 1 tablet (50 mg total) by mouth daily. (Patient not taking:  Reported on 01/19/2018) 90 tablet 3 Not Taking at Unknown time    Current medications: Current Facility-Administered Medications  Medication Dose Route Frequency Provider Last Rate Last Dose  . 0.9 %  sodium chloride infusion  250 mL Intravenous PRN Salary, Montell D, MD      . acetaminophen (TYLENOL) tablet 650 mg  650 mg Oral Q4H PRN Salary, Montell D, MD      . albuterol (PROVENTIL) (2.5 MG/3ML) 0.083% nebulizer solution 2.5 mg  2.5 mg Nebulization Q4H PRN Gouru, Aruna, MD   2.5 mg at 01/26/18 1617  . ALPRAZolam Duanne Moron) tablet 0.25 mg  0.25 mg Oral BID PRN Loney Hering D, MD   0.25 mg at 01/26/18 1954  . aspirin chewable tablet 81 mg  81 mg Oral Daily Salary, Holly Bodily D, MD   81 mg at 01/27/18 0807  . atorvastatin (LIPITOR) tablet 80 mg  80 mg Oral Daily Salary, Holly Bodily D, MD   80 mg at 01/27/18 0807  . carvedilol (COREG) tablet 3.125 mg  3.125 mg Oral BID WC Salary, Montell D, MD   3.125 mg at 01/26/18 1615  . diphenhydrAMINE (BENADRYL) capsule 25 mg  25 mg Oral Q6H PRN Gouru, Aruna, MD      . fluticasone (FLONASE) 50 MCG/ACT nasal spray 1 spray  1 spray Each Nare Daily Gouru, Aruna, MD   1 spray at 01/27/18 0806  . furosemide (LASIX) injection 20 mg  20 mg Intravenous Q12H Lizvette Lightsey, MD   20 mg at 01/27/18 1226  . heparin injection 5,000 Units  5,000 Units Subcutaneous Q8H Salary, Montell D, MD   5,000 Units at 01/27/18 1452  . levothyroxine (SYNTHROID, LEVOTHROID) tablet 25 mcg  25 mcg Oral QAC breakfast Nicholes Mango, MD   25 mcg at 01/27/18 0806  . ondansetron (ZOFRAN) injection 4 mg  4 mg Intravenous Q6H PRN Salary, Montell D, MD      . sodium chloride flush (NS) 0.9 % injection 3 mL  3 mL Intravenous Q12H Salary, Montell D, MD   3 mL at 01/27/18 0812  . sodium chloride flush (NS) 0.9 % injection 3 mL  3 mL Intravenous PRN Salary, Montell D, MD      . traZODone (DESYREL) tablet 25 mg  25 mg Oral QHS PRN Lance Coon, MD   25 mg at 01/26/18 2230      Allergies: Allergies   Allergen Reactions  . Spironolactone Other (See Comments)    Hyperkalemia  . Entresto [Sacubitril-Valsartan] Diarrhea  . Ziac [Bisoprolol-Hydrochlorothiazide]       Past Medical History: Past Medical History:  Diagnosis Date  . AICD (automatic cardioverter/defibrillator) present   . BPH (benign prostatic hyperplasia)   . CAD (coronary artery disease) 05/14/2006  . Cardiac arrest ([place]) 11/12/2013   Overview:  December 2014: ICD implanted  Last Assessment & Plan:  Relevant Hx: Course: Daily Update: Today's Plan:   . Chronic systolic CHF (congestive heart failure) ([place])   . Erectile dysfunction   . H/O coronary artery bypass surgery 05/17/2015   Overview:  LIMA to LAD   . HLD (hyperlipidemia)   . Osteoarthritis of left wrist   . Paroxysmal atrial fibrillation [facility])      Past Surgical History: Past Surgical History:  Procedure Laterality Date  . CARDIAC CATHETERIZATION  06/27/2008  . CARPAL TUNNEL RELEASE    . CORONARY ARTERY BYPASS GRAFT  2006 and 2014   LIMA to LAD at [facility] 2014  . MITRAL VALVE REPAIR       Family History: Family History  Problem Relation Age of Onset  . CAD Mother 77  . Diabetes Mother        Diabetes Mellitus  . Dementia Father   . Glaucoma Father   . Heart disease Unknown      Social History: Social History   Socioeconomic History  . Marital status: Legally Separated    Spouse name: Not on file  . Number of children: 5  . Years of education: Not on file  . Highest education level: Not on file  Social Needs  . Financial resource strain: Not on file  . Food insecurity - worry: Not on file  . Food insecurity - inability: Not on file  . Transportation needs - medical: Not on file  . Transportation needs - non-medical: Not on file  Occupational History  . Occupation: Retired  Tobacco Use  . Smoking status: Former Smoker    Last attempt to quit: 12/01/2004    Years since quitting: 13.1  . Smokeless tobacco: Never Used  Substance and  Sexual Activity  . Alcohol use: Yes    Alcohol/week: 4.2 - 5.4 oz    Types: 1 Glasses of wine, 6 - 8 Cans of beer per week  . Drug use: No  . Sexual activity: Not on  file  Other Topics Concern  . Not on file  Social History Narrative  . Not on file     Review of Systems: Review of Systems  Constitutional: Negative.  Negative for chills, diaphoresis, fever, malaise/fatigue and weight loss.  HENT: Negative.  Negative for congestion, ear discharge, ear pain, hearing loss, nosebleeds, sinus pain, sore throat and tinnitus.   Eyes: Negative.  Negative for blurred vision, double vision, photophobia, pain, discharge and redness.  Respiratory: Positive for shortness of breath and wheezing. Negative for cough, hemoptysis, sputum production and stridor.   Cardiovascular: Positive for leg swelling and PND. Negative for chest pain, palpitations, orthopnea and claudication.  Gastrointestinal: Negative.  Negative for abdominal pain, blood in stool, constipation, diarrhea, heartburn, melena, nausea and vomiting.  Genitourinary: Negative.  Negative for dysuria, flank pain, frequency, hematuria and urgency.  Musculoskeletal: Negative.  Negative for back pain, falls, joint pain, myalgias and neck pain.  Skin: Negative.  Negative for itching and rash.  Neurological: Negative.  Negative for dizziness, tingling, tremors, sensory change, speech change, focal weakness, seizures, loss of consciousness, weakness and headaches.  Endo/Heme/Allergies: Negative for environmental allergies and polydipsia. Does not bruise/bleed easily.  Psychiatric/Behavioral: Negative.  Negative for depression, hallucinations, memory loss, substance abuse and suicidal ideas. The patient is not nervous/anxious and does not have insomnia.     Vital Signs: Blood pressure 106/68, pulse (!) 58, temperature 97.9 F (36.6 C), temperature source Oral, resp. rate 18, height 5\' 10"  (1.778 m), weight 84.3 kg (185 lb 12.8 oz), SpO2 97  %.  Weight trends: Filed Weights   01/25/18 0325 01/26/18 0541 01/27/18 0500  Weight: 82.8 kg (182 lb 9.6 oz) 84.4 kg (186 lb 1.6 oz) 84.3 kg (185 lb 12.8 oz)    Physical Exam: General: NAD,   Head: Normocephalic, atraumatic. Moist oral mucosal membranes  Eyes: Anicteric, PERRL  Neck: Supple, trachea midline  Lungs:  Bibasilar crackles  Heart: Irregular, AICD  Abdomen:  Soft, nontender,   Extremities: 1+ peripheral edema.  Neurologic: Nonfocal, moving all four extremities  Skin: No lesions        Lab results: Basic Metabolic Panel: Recent Labs  Lab 01/23/18 0916  01/25/18 0513 01/26/18 0359 01/27/18 0548  NA 132*   < > 127* 129* 126*  K 2.8*   < > 4.4 4.1 3.8  CL 93*   < > 89* 91* 90*  CO2 26   < > 23 26 23   GLUCOSE 104*   < > 72 83 92  BUN 22*   < > 27* 36* 36*  CREATININE 1.99*   < > 2.33* 2.91* 2.75*  CALCIUM 9.4   < > 9.3 9.4 9.0  MG 2.1  --  2.1  --   --    < > = values in this interval not displayed.    Liver Function Tests: Recent Labs  Lab 01/23/18 0916  AST 51*  ALT 24  ALKPHOS 145*  BILITOT 1.7*  PROT 8.2*  ALBUMIN 3.9   No results for input(s): LIPASE, AMYLASE in the last 168 hours. No results for input(s): AMMONIA in the last 168 hours.  CBC: Recent Labs  Lab 01/23/18 0916 01/25/18 0513  WBC 6.8 7.8  NEUTROABS 5.0  --   HGB 18.4* 18.3*  HCT 55.0* 55.2*  MCV 91.8 92.2  PLT 158 126*    Cardiac Enzymes: Recent Labs  Lab 01/23/18 0916 01/23/18 1257 01/23/18 1714 01/24/18 0008  TROPONINI 0.07* 0.08* 0.08* 0.06*    BNP: Invalid  input(s): POCBNP  CBG: No results for input(s): GLUCAP in the last 168 hours.  Microbiology: No results found for this or any previous visit.  Coagulation Studies: No results for input(s): LABPROT, INR in the last 72 hours.  Urinalysis: No results for input(s): COLORURINE, LABSPEC, PHURINE, GLUCOSEU, HGBUR, BILIRUBINUR, KETONESUR, PROTEINUR, UROBILINOGEN, NITRITE, LEUKOCYTESUR in the last 72  hours.  Invalid input(s): APPERANCEUR    Imaging: US Renal  Result Date: 01/27/2018 CLINICAL DATA:  Acute kidney failure. EXAM: RENAL / URINARY TRACT ULTRASOUND COMPLETE COMPARISON:  None. FINDINGS: Right Kidney: Length: 8.5 centimeters. Normal thickness of the renal cortex. No hydronephrosis or mass. Normal echogenicity. Left Kidney: Length: 10.4 centimeters. Echogenicity within normal limits. No mass or hydronephrosis visualized. Bladder: Appears normal for degree of bladder distention. Additional: Bilateral pleural effusions are noted. IMPRESSION: 1. The RIGHT kidney is smaller than the LEFT but demonstrates normal cortical thickness. 2. No hydronephrosis or suspicious renal mass. 3. Bilateral pleural effusions. Electronically Signed   By: Nolon Nations M.D.   On: 01/27/2018 11:58      Assessment & Plan: Chris Nguyen is a 67 y.o. black male with hypertension, congestive heart failure, atrial fibrillation, hyperlipidemia, coronary artery disease status post CABG, AICD, BPH, mitral valve repair, who was admitted to [facility] on 01/23/2018 for Mixed hyperlipidemia [E78.2] Hypokalemia [E87.6] Essential hypertension [I10] Cardiomyopathy, unspecified type ([place]) [I42.9] Acute on chronic congestive heart failure, unspecified heart failure type ([place]) [I50.9]   1. Acute renal failure on chronic kidney disease stage III:  2. Hyponatremia 3. Acute exacerbation of systolic and diastolic congestive heart failure 4. Hypertension  Plan Holding spironolactone. Examination consistent with acute heart failure. Restart furosemide 20mg  IV     LOS: 4 Abby Stines 2/27/20196:07 PM

## 2018-01-27 NOTE — Progress Notes (Signed)
PT Cancellation Note  Patient Details Name: Chris Nguyen MRN: [id number] DOB: 04/19/1951   Cancelled Treatment:    Reason Eval/Treat Not Completed: Patient declined, no reason specified;Other (comment). Treatment attempted; pt declined due to numerous visitors in the room. Re attempt at a later time, as the schedule allows.    Larae Grooms, PTA 01/27/2018, 1:30 PM

## 2018-01-27 NOTE — Plan of Care (Signed)
  Progressing Health Behavior/Discharge Planning: Ability to manage health-related needs will improve 01/27/2018 1851 - Progressing by Rolley Sims, RN Clinical Measurements: Will remain free from infection 01/27/2018 1851 - Progressing by Rolley Sims, RN Diagnostic test results will improve 01/27/2018 1851 - Progressing by Rolley Sims, RN

## 2018-01-28 ENCOUNTER — Inpatient Hospital Stay: Payer: Medicare HMO

## 2018-01-28 LAB — BASIC METABOLIC PANEL
Anion gap: 10 (ref 5–15)
BUN: 33 mg/dL — ABNORMAL HIGH (ref 6–20)
CALCIUM: 9.2 mg/dL (ref 8.9–10.3)
CHLORIDE: 87 mmol/L — AB (ref 101–111)
CO2: 27 mmol/L (ref 22–32)
CREATININE: 2.51 mg/dL — AB (ref 0.61–1.24)
GFR calc Af Amer: 29 mL/min — ABNORMAL LOW (ref >60)
GFR calc non Af Amer: 25 mL/min — ABNORMAL LOW (ref >60)
GLUCOSE: 77 mg/dL (ref 65–99)
Potassium: 3.9 mmol/L (ref 3.5–5.1)
Sodium: 124 mmol/L — ABNORMAL LOW (ref 135–145)

## 2018-01-28 MED ORDER — FUROSEMIDE 10 MG/ML IJ SOLN
40.0000 mg | Freq: Two times a day (BID) | INTRAMUSCULAR | Status: DC
  Administered 2018-01-28 – 2018-01-30 (×4): 40 mg via INTRAVENOUS
  Filled 2018-01-28 (×4): qty 4

## 2018-01-28 MED ORDER — ALBUTEROL SULFATE (2.5 MG/3ML) 0.083% IN NEBU
2.5000 mg | INHALATION_SOLUTION | Freq: Four times a day (QID) | RESPIRATORY_TRACT | Status: DC | PRN
  Administered 2018-01-28: 2.5 mg via RESPIRATORY_TRACT
  Filled 2018-01-28: qty 3

## 2018-01-28 MED ORDER — METHYLPREDNISOLONE SODIUM SUCC 40 MG IJ SOLR
40.0000 mg | Freq: Every day | INTRAMUSCULAR | Status: DC
  Administered 2018-01-28 – 2018-01-31 (×4): 40 mg via INTRAVENOUS
  Filled 2018-01-28 (×4): qty 1

## 2018-01-28 MED ORDER — IPRATROPIUM-ALBUTEROL 0.5-2.5 (3) MG/3ML IN SOLN
3.0000 mL | Freq: Four times a day (QID) | RESPIRATORY_TRACT | Status: DC
  Administered 2018-01-28: 3 mL via RESPIRATORY_TRACT
  Filled 2018-01-28: qty 3

## 2018-01-28 MED ORDER — GUAIFENESIN 100 MG/5ML PO SOLN
5.0000 mL | ORAL | Status: DC | PRN
  Administered 2018-01-28: 100 mg via ORAL
  Filled 2018-01-28 (×3): qty 10

## 2018-01-28 MED ORDER — ALBUTEROL SULFATE (2.5 MG/3ML) 0.083% IN NEBU
2.5000 mg | INHALATION_SOLUTION | Freq: Four times a day (QID) | RESPIRATORY_TRACT | Status: DC
  Administered 2018-01-28 – 2018-01-31 (×9): 2.5 mg via RESPIRATORY_TRACT
  Filled 2018-01-28 (×10): qty 3

## 2018-01-28 MED ORDER — BUDESONIDE 0.5 MG/2ML IN SUSP
0.5000 mg | Freq: Two times a day (BID) | RESPIRATORY_TRACT | Status: DC
  Administered 2018-01-28 – 2018-01-31 (×6): 0.5 mg via RESPIRATORY_TRACT
  Filled 2018-01-28 (×6): qty 2

## 2018-01-28 MED ORDER — IPRATROPIUM-ALBUTEROL 0.5-2.5 (3) MG/3ML IN SOLN
3.0000 mL | RESPIRATORY_TRACT | Status: DC | PRN
  Administered 2018-01-28: 3 mL via RESPIRATORY_TRACT
  Filled 2018-01-28: qty 3

## 2018-01-28 NOTE — Progress Notes (Signed)
Rounded on patient. Patient has visitor in room.  Patient sitting up in recliner with meal tray in front of him.  Patient c/o frequent dry cough with occasional productive cough of clear sputum.  Patient informed this RN that he is not feeling as well today and his weight is increasing.  He also stated he does not have the breath to eat, as he is so fatigued.  Patient requesting this RN to ask his nurse about something for his cough.  This RN notified the nurse covering / caring for this patient at this time.  Will round on patient again tomorrow.  Roanna Epley, RN, BSN, [facility] Cardiovascular and Pulmonary Nurse Navigator

## 2018-01-28 NOTE — Progress Notes (Signed)
Patient ID: Chris Nguyen, male   DOB: 1950-12-22, 67 y.o.   MRN: [id number]  [employer] PROGRESS NOTE  Chris Nguyen NLG:[id number] DOB: 28-Jan-1951 DOA: 01/23/2018 PCP: Chris Sons, MD  HPI/Subjective: Patient seen this morning and was not breathing well.  He states that the breathing treatment made him worse.  Patient with dry cough.  Unable to come off the oxygen as of last night.  Objective: Vitals:   01/28/18 1553 01/28/18 1555  BP: (!) 143/101 112/79  Pulse: 63 68  Resp: (!) 22   Temp: (!) 97.4 F (36.3 C)   SpO2: 100% 94%    Filed Weights   01/26/18 0541 01/27/18 0500 01/28/18 0230  Weight: 84.4 kg (186 lb 1.6 oz) 84.3 kg (185 lb 12.8 oz) 84.8 kg (187 lb)    ROS: Review of Systems  Constitutional: Negative for chills and fever.  Eyes: Negative for blurred vision.  Respiratory: Positive for cough and shortness of breath.   Cardiovascular: Negative for chest pain.  Gastrointestinal: Negative for abdominal pain, constipation, diarrhea, nausea and vomiting.  Genitourinary: Negative for dysuria.  Musculoskeletal: Negative for joint pain.  Neurological: Negative for dizziness and headaches.   Exam: Physical Exam  Constitutional: He is oriented to person, place, and time.  HENT:  Nose: No mucosal edema.  Mouth/Throat: No oropharyngeal exudate or posterior oropharyngeal edema.  Eyes: Conjunctivae, EOM and lids are normal. Pupils are equal, round, and reactive to light.  Neck: No JVD present. Carotid bruit is not present. No edema present. No thyroid mass and no thyromegaly present.  Cardiovascular: S1 normal and S2 normal. Exam reveals no gallop.  No murmur heard. Pulses:      Dorsalis pedis pulses are 2+ on the right side, and 2+ on the left side.  Respiratory: No respiratory distress. He has decreased breath sounds in the right middle field, the right lower field, the left middle field and the left lower field. He has no wheezes. He has no rhonchi. He has  rales in the right lower field and the left lower field.  GI: Soft. Bowel sounds are normal. There is no tenderness.  Musculoskeletal:       Right ankle: He exhibits swelling.       Left ankle: He exhibits swelling.  Lymphadenopathy:    He has no cervical adenopathy.  Neurological: He is alert and oriented to person, place, and time. No cranial nerve deficit.  Skin: Skin is warm. No rash noted. Nails show no clubbing.  Psychiatric: He has a normal mood and affect.      Data Reviewed: Basic Metabolic Panel: Recent Labs  Lab 01/23/18 0916  01/24/18 0008 01/25/18 0513 01/26/18 0359 01/27/18 0548 01/28/18 0437  NA 132*  --  134* 127* 129* 126* 124*  K 2.8*   < > 3.1* 4.4 4.1 3.8 3.9  CL 93*  --  99* 89* 91* 90* 87*  CO2 26  --  24 23 26 23 27   GLUCOSE 104*  --  99 72 83 92 77  BUN 22*  --  20 27* 36* 36* 33*  CREATININE 1.99*  --  1.69* 2.33* 2.91* 2.75* 2.51*  CALCIUM 9.4  --  9.0 9.3 9.4 9.0 9.2  MG 2.1  --   --  2.1  --   --   --    < > = values in this interval not displayed.   Liver Function Tests: Recent Labs  Lab 01/23/18 0916  AST 51*  ALT  24  ALKPHOS 145*  BILITOT 1.7*  PROT 8.2*  ALBUMIN 3.9   CBC: Recent Labs  Lab 01/23/18 0916 01/25/18 0513  WBC 6.8 7.8  NEUTROABS 5.0  --   HGB 18.4* 18.3*  HCT 55.0* 55.2*  MCV 91.8 92.2  PLT 158 126*   Cardiac Enzymes: Recent Labs  Lab 01/23/18 0916 01/23/18 1257 01/23/18 1714 01/24/18 0008  TROPONINI 0.07* 0.08* 0.08* 0.06*   BNP (last 3 results) Recent Labs    11/07/17 0834 01/23/18 0916 01/23/18 1343  BNP 724.0* 957.0* 888.0*      Studies: US Renal  Result Date: 01/27/2018 CLINICAL DATA:  Acute kidney failure. EXAM: RENAL / URINARY TRACT ULTRASOUND COMPLETE COMPARISON:  None. FINDINGS: Right Kidney: Length: 8.5 centimeters. Normal thickness of the renal cortex. No hydronephrosis or mass. Normal echogenicity. Left Kidney: Length: 10.4 centimeters. Echogenicity within normal limits. No mass or  hydronephrosis visualized. Bladder: Appears normal for degree of bladder distention. Additional: Bilateral pleural effusions are noted. IMPRESSION: 1. The RIGHT kidney is smaller than the LEFT but demonstrates normal cortical thickness. 2. No hydronephrosis or suspicious renal mass. 3. Bilateral pleural effusions. Electronically Signed   By: Nolon Nations M.D.   On: 01/27/2018 11:58    Scheduled Meds: . albuterol  2.5 mg Nebulization Q6H  . aspirin  81 mg Oral Daily  . atorvastatin  80 mg Oral Daily  . budesonide (PULMICORT) nebulizer solution  0.5 mg Nebulization BID  . carvedilol  3.125 mg Oral BID WC  . fluticasone  1 spray Each Nare Daily  . furosemide  40 mg Intravenous BID  . heparin  5,000 Units Subcutaneous Q8H  . levothyroxine  25 mcg Oral QAC breakfast  . methylPREDNISolone (SOLU-MEDROL) injection  40 mg Intravenous Daily  . sodium chloride flush  3 mL Intravenous Q12H   Continuous Infusions: . sodium chloride      Assessment/Plan:  1. Acute hypoxic respiratory failure.  At this point unable to get off the oxygen.  Continue oxygen supplementation.  Since the patient came in without wearing oxygen we will try to get off oxygen prior to disposition.  Continue to monitor pulse ox.  Chest x-ray ordered.  Ordered Solu-Medrol nebulizers. 2. Acute systolic congestive heart failure.  Increase Lasix back to 40 mg IV twice daily.  Obtain chest x-ray. 3. Acute kidney injury on chronic kidney disease stage III.  Continue to monitor creatinine closely 4. Hyponatremia.  Sodium down to 124. 5. Hyperlipidemia unspecified on atorvastatin 6. Hypothyroidism unspecified on levothyroxine  Code Status:     Code Status Orders  (From admission, onward)        Start     Ordered   01/23/18 1259  Full code  Continuous     01/23/18 1259    Code Status History    Date Active Date Inactive Code Status Order ID Comments User Context   01/16/2017 15:21 01/19/2017 15:03 Full Code [id number]   Vaughan Basta, MD Inpatient   02/25/2016 00:58 02/25/2016 20:16 Full Code [id number]  Lance Coon, MD ED      Disposition Plan: To be determined  Consultants:  Nephrology  Cardiology  Time spent: 28 minutes  [place]

## 2018-01-28 NOTE — Progress Notes (Signed)
[facility]  ROUNDING NOTE   Subjective:   Na 124 Creatinine 2.51 (2.75)  Objective:  Vital signs in last 24 hours:  Temp:  [97.6 F (36.4 C)-97.9 F (36.6 C)] 97.6 F (36.4 C) (02/28 0906) Pulse Rate:  [58-124] 124 (02/28 0906) Resp:  [18-20] 20 (02/28 0906) BP: (106-135)/(68-89) 135/83 (02/28 0906) SpO2:  [90 %-100 %] 95 % (02/28 0906) Weight:  [84.8 kg (187 lb)] 84.8 kg (187 lb) (02/28 0230)  Weight change: 0.544 kg (1 lb 3.2 oz) Filed Weights   01/26/18 0541 01/27/18 0500 01/28/18 0230  Weight: 84.4 kg (186 lb 1.6 oz) 84.3 kg (185 lb 12.8 oz) 84.8 kg (187 lb)    Intake/Output: I/O last 3 completed shifts: In: 10 [I.V.:10] Out: 825 [Urine:825]   Intake/Output this shift:  Total I/O In: 120 [P.O.:120] Out: -   Physical Exam: General: NAD,   Head: Normocephalic, atraumatic. Moist oral mucosal membranes  Eyes: Anicteric, PERRL  Neck: Supple, trachea midline  Lungs:  Clear to auscultation  Heart: Regular rate and rhythm  Abdomen:  Soft, nontender,   Extremities: trace peripheral edema.  Neurologic: Nonfocal, moving all four extremities  Skin: No lesions       Basic Metabolic Panel: Recent Labs  Lab 01/23/18 0916  01/24/18 0008 01/25/18 0513 01/26/18 0359 01/27/18 0548 01/28/18 0437  NA 132*  --  134* 127* 129* 126* 124*  K 2.8*   < > 3.1* 4.4 4.1 3.8 3.9  CL 93*  --  99* 89* 91* 90* 87*  CO2 26  --  24 23 26 23 27   GLUCOSE 104*  --  99 72 83 92 77  BUN 22*  --  20 27* 36* 36* 33*  CREATININE 1.99*  --  1.69* 2.33* 2.91* 2.75* 2.51*  CALCIUM 9.4  --  9.0 9.3 9.4 9.0 9.2  MG 2.1  --   --  2.1  --   --   --    < > = values in this interval not displayed.    Liver Function Tests: Recent Labs  Lab 01/23/18 0916  AST 51*  ALT 24  ALKPHOS 145*  BILITOT 1.7*  PROT 8.2*  ALBUMIN 3.9   No results for input(s): LIPASE, AMYLASE in the last 168 hours. No results for input(s): AMMONIA in the last 168 hours.  CBC: Recent Labs  Lab  01/23/18 0916 01/25/18 0513  WBC 6.8 7.8  NEUTROABS 5.0  --   HGB 18.4* 18.3*  HCT 55.0* 55.2*  MCV 91.8 92.2  PLT 158 126*    Cardiac Enzymes: Recent Labs  Lab 01/23/18 0916 01/23/18 1257 01/23/18 1714 01/24/18 0008  TROPONINI 0.07* 0.08* 0.08* 0.06*    BNP: Invalid input(s): POCBNP  CBG: No results for input(s): GLUCAP in the last 168 hours.  Microbiology: No results found for this or any previous visit.  Coagulation Studies: No results for input(s): LABPROT, INR in the last 72 hours.  Urinalysis: No results for input(s): COLORURINE, LABSPEC, PHURINE, GLUCOSEU, HGBUR, BILIRUBINUR, KETONESUR, PROTEINUR, UROBILINOGEN, NITRITE, LEUKOCYTESUR in the last 72 hours.  Invalid input(s): APPERANCEUR    Imaging: US Renal  Result Date: 01/27/2018 CLINICAL DATA:  Acute kidney failure. EXAM: RENAL / URINARY TRACT ULTRASOUND COMPLETE COMPARISON:  None. FINDINGS: Right Kidney: Length: 8.5 centimeters. Normal thickness of the renal cortex. No hydronephrosis or mass. Normal echogenicity. Left Kidney: Length: 10.4 centimeters. Echogenicity within normal limits. No mass or hydronephrosis visualized. Bladder: Appears normal for degree of bladder distention. Additional: Bilateral pleural  effusions are noted. IMPRESSION: 1. The RIGHT kidney is smaller than the LEFT but demonstrates normal cortical thickness. 2. No hydronephrosis or suspicious renal mass. 3. Bilateral pleural effusions. Electronically Signed   By: Nolon Nations M.D.   On: 01/27/2018 11:58     Medications:   . sodium chloride     . aspirin  81 mg Oral Daily  . atorvastatin  80 mg Oral Daily  . carvedilol  3.125 mg Oral BID WC  . fluticasone  1 spray Each Nare Daily  . furosemide  20 mg Intravenous Q12H  . heparin  5,000 Units Subcutaneous Q8H  . levothyroxine  25 mcg Oral QAC breakfast  . sodium chloride flush  3 mL Intravenous Q12H   sodium chloride, acetaminophen, albuterol, ALPRAZolam, diphenhydrAMINE,  ondansetron (ZOFRAN) IV, sodium chloride flush, traZODone  Assessment/ Plan:  [name] is a 67 y.o. black male with hypertension, congestive heart failure, atrial fibrillation, hyperlipidemia, coronary artery disease status post CABG, AICD, BPH, mitral valve repair, who was admitted to [facility] on 01/23/2018 for Mixed hyperlipidemia [E78.2] Hypokalemia [E87.6] Essential hypertension [I10] Cardiomyopathy, unspecified type ([place]) [I42.9] Acute on chronic congestive heart failure, unspecified heart failure type ([place]) [I50.9]   1. Acute renal failure on chronic kidney disease stage III:  2. Hyponatremia 3. Acute exacerbation of systolic and diastolic congestive heart failure 4. Hypertension  Plan Holding spironolactone. Examination consistent with acute heart failure.  - Continue furosemide 20mg  IV  - Fluid restriction     LOS: 5 Kataryna Mcquilkin 2/28/20192:21 PM

## 2018-01-29 LAB — PROTEIN / CREATININE RATIO, URINE
Creatinine, Urine: 48 mg/dL
Protein Creatinine Ratio: 0.54 mg/mg{Cre} — ABNORMAL HIGH (ref 0.00–0.15)
Total Protein, Urine: 26 mg/dL

## 2018-01-29 LAB — RENAL FUNCTION PANEL
ANION GAP: 11 (ref 5–15)
Albumin: 3.5 g/dL (ref 3.5–5.0)
BUN: 36 mg/dL — AB (ref 6–20)
CO2: 27 mmol/L (ref 22–32)
Calcium: 9.1 mg/dL (ref 8.9–10.3)
Chloride: 87 mmol/L — ABNORMAL LOW (ref 101–111)
Creatinine, Ser: 2.45 mg/dL — ABNORMAL HIGH (ref 0.61–1.24)
GFR calc Af Amer: 30 mL/min — ABNORMAL LOW (ref >60)
GFR calc non Af Amer: 26 mL/min — ABNORMAL LOW (ref >60)
GLUCOSE: 107 mg/dL — AB (ref 65–99)
POTASSIUM: 4.3 mmol/L (ref 3.5–5.1)
Phosphorus: 3.8 mg/dL (ref 2.5–4.6)
SODIUM: 125 mmol/L — AB (ref 135–145)

## 2018-01-29 LAB — URINALYSIS, ROUTINE W REFLEX MICROSCOPIC
BACTERIA UA: NONE SEEN
BILIRUBIN URINE: NEGATIVE
Glucose, UA: NEGATIVE mg/dL
KETONES UR: NEGATIVE mg/dL
LEUKOCYTES UA: NEGATIVE
Nitrite: NEGATIVE
PROTEIN: NEGATIVE mg/dL
RBC / HPF: NONE SEEN RBC/hpf (ref 0–5)
Specific Gravity, Urine: 1.005 (ref 1.005–1.030)
pH: 5 (ref 5.0–8.0)

## 2018-01-29 NOTE — Progress Notes (Signed)
Physical Therapy Treatment Patient Details Name: Chris Nguyen MRN: [id number] DOB: 24-Jun-1951 Today's Date: 01/29/2018    History of Present Illness 67 yo male with onset of SOB and exertional distress was admitted and discovered elevated troponin, low K+, PMHx:  CAD, CABG, AICD, CHF, cardiac arrest, HLD,     PT Comments    Pt able to ambulate a full lap around RN station with therapist. SaO2 is fluctuating frequently per RN prior to start of session. At rest SaO2 fluctuates between mid 80's to upper 90's but mostly remains above 90%. Pt denies and dyspnea during desaturation events. During ambulation SaO2 drops once to 73% but spontaneously recovers to 97% on room air. Pt denies DOE during entire ambulation. SaO2 mostly fluctuates between 88% and 95% and remains around 95% for the bulk of the time. Pt is steady with ambulation and does not require external support from therapist for balance. At end of session therapist observed patients respirations and he has long pauses in breathing with desaturation events followed by deep inspirations once breathing resumes. RN and MD notified. Pt is safe to return home with support from family. Pt will benefit from PT services to address deficits in strength, balance, and mobility in order to return to full function at home.    Follow Up Recommendations  Home health PT;Supervision - Intermittent     Equipment Recommendations  Rolling walker with 5" wheels    Recommendations for Other Services       Precautions / Restrictions Precautions Precautions: Fall(telemetry) Precaution Comments: ck O2 sats Restrictions Weight Bearing Restrictions: No    Mobility  Bed Mobility Overal bed mobility: Needs Assistance Bed Mobility: Supine to Sit;Sit to Supine     Supine to sit: Modified independent (Device/Increase time) Sit to supine: Modified independent (Device/Increase time)   General bed mobility comments: HOB elevated. Fair speed and sequencing.    Transfers Overall transfer level: Needs assistance Equipment used: Rolling walker (2 wheeled) Transfers: Sit to/from Stand Sit to Stand: Supervision         General transfer comment: Pt demonstrates safe hand placement and good sequencing. Slight increase in time required. He is stable in standing with UE support on rolling walker. Able to balance without UE support  Ambulation/Gait Ambulation/Gait assistance: Min guard Ambulation Distance (Feet): 200 Feet Assistive device: Rolling walker (2 wheeled) Gait Pattern/deviations: Step-through pattern;Wide base of support;Trunk flexed Gait velocity: Decreased   General Gait Details: Pt able to ambulate a full lap around RN station with therapist. SaO2 is fluctuating frequently per RN. At rest SaO2 fluctuates between mid 80's to upper 90's but mostly remains above 90%. Pt denies and dyspnea during desat events. During ambulation SaO2 drops once to 73% but spontaneously recovers to 97% on room air. Pt denies DOE during entire ambulation. SaO2 mostly fluctuates between 88% and 95% and remains around 95% for the majority of the time. Pt is steady with ambulation and does not require external support from therapist   Stairs            Wheelchair Mobility    Modified Rankin (Stroke Patients Only)       Balance Overall balance assessment: Needs assistance Sitting-balance support: No upper extremity supported Sitting balance-Leahy Scale: Good     Standing balance support: No upper extremity supported Standing balance-Leahy Scale: Fair Standing balance comment: Able to maintain standing balance without UE support  Cognition Arousal/Alertness: Awake/alert Behavior During Therapy: WFL for tasks assessed/performed Overall Cognitive Status: Within Functional Limits for tasks assessed                                        Exercises      General Comments         Pertinent Vitals/Pain Pain Assessment: No/denies pain    Home Living                      Prior Function            PT Goals (current goals can now be found in the care plan section) Acute Rehab PT Goals Patient Stated Goal: to get home with no therapy as he had issue with last PT  PT Goal Formulation: With patient Time For Goal Achievement: 01/31/18 Potential to Achieve Goals: Good Progress towards PT goals: Progressing toward goals    Frequency    Min 2X/week      PT Plan Current plan remains appropriate    Co-evaluation              AM-PAC PT "6 Clicks" Daily Activity  Outcome Measure  Difficulty turning over in bed (including adjusting bedclothes, sheets and blankets)?: None Difficulty moving from lying on back to sitting on the side of the bed? : A Little Difficulty sitting down on and standing up from a chair with arms (e.g., wheelchair, bedside commode, etc,.)?: A Little Help needed moving to and from a bed to chair (including a wheelchair)?: A Little Help needed walking in hospital room?: A Little Help needed climbing 3-5 steps with a railing? : A Lot 6 Click Score: 18    End of Session Equipment Utilized During Treatment: Gait belt Activity Tolerance: Patient tolerated treatment well Patient left: in bed;with call bell/phone within reach;with bed alarm set;Other (comment)(MD in room) Nurse Communication: Mobility status;Other (comment)(SaO2 readings) PT Visit Diagnosis: Unsteadiness on feet (R26.81);Other abnormalities of gait and mobility (R26.89);Repeated falls (R29.6);History of falling (Z91.81);Muscle weakness (generalized) (M62.81);Difficulty in walking, not elsewhere classified (R26.2);Adult, failure to thrive (R62.7)     Time: 1050-1105 PT Time Calculation (min) (ACUTE ONLY): 15 min  Charges:  $Gait Training: 8-22 mins                    G Codes:       Lyndel Safe Shady Bradish PT, DPT    Autymn Omlor 01/29/2018, 12:10 PM

## 2018-01-29 NOTE — Progress Notes (Signed)
MD notified of RN holding Heparin SQ. Pt has a bruise that extends across the whole abdomen. No new orders at this time. I will continue to assess.

## 2018-01-29 NOTE — Progress Notes (Signed)
Patient ID: Chris Nguyen, male   DOB: 1950-12-21, 67 y.o.   MRN: [id number]  [employer] PROGRESS NOTE  Chris Nguyen WVP:[id number] DOB: May 13, 1951 DOA: 01/23/2018 PCP: Birdie Sons, MD  HPI/Subjective: No shortness of breath.  Walked with physical therapy.  O2 sat dropped into to less than 88 only for a few seconds and went up to more than 90 without intervention.  And patient told me that he has been doing get for the last 6 years where he would suddenly get apneic and stopped breathing and then he immediately takes deep breaths and oxygen saturation goes up in 90 s.  No chest pain.  I spoke with patient's brother because there was concern about him requesting transfer to [facility] but I told him that there is no medical indication for him to go to [facility]. Objective: Vitals:   01/29/18 1056 01/29/18 1101  BP: 130/83   Pulse: 63   Resp:    Temp:    SpO2: 98% 97%    Filed Weights   01/27/18 0500 01/28/18 0230 01/29/18 0445  Weight: 84.3 kg (185 lb 12.8 oz) 84.8 kg (187 lb) 84.5 kg (186 lb 3.2 oz)    ROS: Review of Systems  Constitutional: Negative for chills and fever.  Eyes: Negative for blurred vision.  Respiratory: Negative for cough and shortness of breath.   Cardiovascular: Negative for chest pain.  Gastrointestinal: Negative for abdominal pain, constipation, diarrhea, nausea and vomiting.  Genitourinary: Negative for dysuria.  Musculoskeletal: Negative for joint pain.  Neurological: Negative for dizziness and headaches.   Exam: Physical Exam  Constitutional: He is oriented to person, place, and time.  HENT:  Nose: No mucosal edema.  Mouth/Throat: No oropharyngeal exudate or posterior oropharyngeal edema.  Eyes: Conjunctivae, EOM and lids are normal. Pupils are equal, round, and reactive to light.  Neck: No JVD present. Carotid bruit is not present. No edema present. No thyroid mass and no thyromegaly present.  Cardiovascular: S1 normal and S2 normal. Exam reveals no  gallop.  No murmur heard. Pulses:      Dorsalis pedis pulses are 2+ on the right side, and 2+ on the left side.  Respiratory: No respiratory distress. He has no wheezes. He has no rhonchi. He has no rales.  GI: Soft. Bowel sounds are normal. There is no tenderness.  Musculoskeletal:       Right ankle: He exhibits swelling.       Left ankle: He exhibits swelling.  Lymphadenopathy:    He has no cervical adenopathy.  Neurological: He is alert and oriented to person, place, and time. No cranial nerve deficit.  Skin: Skin is warm. No rash noted. Nails show no clubbing.  Psychiatric: He has a normal mood and affect.      Data Reviewed: Basic Metabolic Panel: Recent Labs  Lab 01/23/18 0916  01/25/18 0513 01/26/18 0359 01/27/18 0548 01/28/18 0437 01/29/18 0700  NA 132*   < > 127* 129* 126* 124* 125*  K 2.8*   < > 4.4 4.1 3.8 3.9 4.3  CL 93*   < > 89* 91* 90* 87* 87*  CO2 26   < > 23 26 23 27 27   GLUCOSE 104*   < > 72 83 92 77 107*  BUN 22*   < > 27* 36* 36* 33* 36*  CREATININE 1.99*   < > 2.33* 2.91* 2.75* 2.51* 2.45*  CALCIUM 9.4   < > 9.3 9.4 9.0 9.2 9.1  MG 2.1  --  2.1  --   --   --   --   PHOS  --   --   --   --   --   --  3.8   < > = values in this interval not displayed.   Liver Function Tests: Recent Labs  Lab 01/23/18 0916 01/29/18 0700  AST 51*  --   ALT 24  --   ALKPHOS 145*  --   BILITOT 1.7*  --   PROT 8.2*  --   ALBUMIN 3.9 3.5   CBC: Recent Labs  Lab 01/23/18 0916 01/25/18 0513  WBC 6.8 7.8  NEUTROABS 5.0  --   HGB 18.4* 18.3*  HCT 55.0* 55.2*  MCV 91.8 92.2  PLT 158 126*   Cardiac Enzymes: Recent Labs  Lab 01/23/18 0916 01/23/18 1257 01/23/18 1714 01/24/18 0008  TROPONINI 0.07* 0.08* 0.08* 0.06*   BNP (last 3 results) Recent Labs    11/07/17 0834 01/23/18 0916 01/23/18 1343  BNP 724.0* 957.0* 888.0*      Studies: US Renal  Result Date: 01/27/2018 CLINICAL DATA:  Acute kidney failure. EXAM: RENAL / URINARY TRACT ULTRASOUND  COMPLETE COMPARISON:  None. FINDINGS: Right Kidney: Length: 8.5 centimeters. Normal thickness of the renal cortex. No hydronephrosis or mass. Normal echogenicity. Left Kidney: Length: 10.4 centimeters. Echogenicity within normal limits. No mass or hydronephrosis visualized. Bladder: Appears normal for degree of bladder distention. Additional: Bilateral pleural effusions are noted. IMPRESSION: 1. The RIGHT kidney is smaller than the LEFT but demonstrates normal cortical thickness. 2. No hydronephrosis or suspicious renal mass. 3. Bilateral pleural effusions. Electronically Signed   By: Nolon Nations M.D.   On: 01/27/2018 11:58   Dg Chest Port 1 View  Result Date: 01/28/2018 CLINICAL DATA:  Shortness of breath EXAM: PORTABLE CHEST 1 VIEW COMPARISON:  01/23/2018 and prior radiographs FINDINGS: Cardiomegaly, median sternotomy and AICD again noted. Slightly increased pulmonary vascular congestion noted. Small bilateral pleural effusions and bibasilar atelectasis versus airspace disease noted in slightly increased. There is no evidence of pneumothorax. IMPRESSION: Cardiomegaly with slightly increasing pulmonary vascular congestion, small bilateral pleural effusions and bibasilar atelectasis/airspace disease. Electronically Signed   By: Margarette Canada M.D.   On: 01/28/2018 17:17    Scheduled Meds: . albuterol  2.5 mg Nebulization Q6H  . aspirin  81 mg Oral Daily  . atorvastatin  80 mg Oral Daily  . budesonide (PULMICORT) nebulizer solution  0.5 mg Nebulization BID  . carvedilol  3.125 mg Oral BID WC  . fluticasone  1 spray Each Nare Daily  . furosemide  40 mg Intravenous BID  . heparin  5,000 Units Subcutaneous Q8H  . levothyroxine  25 mcg Oral QAC breakfast  . methylPREDNISolone (SOLU-MEDROL) injection  40 mg Intravenous Daily  . sodium chloride flush  3 mL Intravenous Q12H   Continuous Infusions: . sodium chloride      Assessment/Plan:  1. Acute hypoxic respiratory failure.  Acute systolic  congestive heart failure.  EF 20%.  Patient on IV Lasix.  Continue IV Lasix today.  Needs to be followed at heart failure clinic, education about CHF. 2. Check daily weights, urine output. 3. Patient still has hyponatremia secondary to heart failure.  Seen by nephrology.  Continue IV Lasix.  Continue fluid restriction.  Monitor sodium closely. 4. Acute kidney injury on chronic kidney disease stage III.  Continue to monitor creatinine closely, slight improvement in kidney function today. 5. Hyponatremia.  Sodium down to 124.,  Today  improved to 125. 6.  Hyperlipidemia unspecified on atorvastatin 7. Hypothyroidism unspecified on levothyroxine I spoke with patient and also patient's brother.  Told him that transfer to [facility] is medically not indicated.  The brother understands.  Patient will be monitored in next 1-2 days for improvement of his symptoms.    Code Status Orders  (From admission, onward)        Start     Ordered   01/23/18 1259  Full code  Continuous     01/23/18 1259    Code Status History    Date Active Date Inactive Code Status Order ID Comments User Context   01/16/2017 15:21 01/19/2017 15:03 Full Code [id number]  Vaughan Basta, MD Inpatient   02/25/2016 00:58 02/25/2016 20:16 Full Code [id number]  Lance Coon, MD ED      Disposition Plan: To be determined  Consultants:  Nephrology  Cardiology  Time spent: 30 minutes  [place]

## 2018-01-29 NOTE — Progress Notes (Signed)
[facility]  ROUNDING NOTE   Subjective:   UOP 960 Furosemide 40mg  IV q12   Daughter at bedside.   Na 125  Objective:  Vital signs in last 24 hours:  Temp:  [97.4 F (36.3 C)-98.4 F (36.9 C)] 98.4 F (36.9 C) (03/01 0922) Pulse Rate:  [60-70] 63 (03/01 1056) Resp:  [18-22] 18 (03/01 0922) BP: (112-143)/(79-101) 130/83 (03/01 1056) SpO2:  [94 %-100 %] 97 % (03/01 1101) Weight:  [84.5 kg (186 lb 3.2 oz)] 84.5 kg (186 lb 3.2 oz) (03/01 0445)  Weight change: -0.363 kg (-12.8 oz) Filed Weights   01/27/18 0500 01/28/18 0230 01/29/18 0445  Weight: 84.3 kg (185 lb 12.8 oz) 84.8 kg (187 lb) 84.5 kg (186 lb 3.2 oz)    Intake/Output: I/O last 3 completed shifts: In: 253 [P.O.:240; I.V.:13] Out: 1185 [Urine:1185]   Intake/Output this shift:  Total I/O In: -  Out: 125 [Urine:125]  Physical Exam: General: NAD,   Head: Normocephalic, atraumatic. Moist oral mucosal membranes  Eyes: Anicteric, PERRL  Neck: Supple, trachea midline  Lungs:  Clear to auscultation  Heart: Regular rate and rhythm  Abdomen:  Soft, nontender,   Extremities: trace peripheral edema.  Neurologic: Nonfocal, moving all four extremities  Skin: No lesions       Basic Metabolic Panel: Recent Labs  Lab 01/23/18 0916  01/25/18 0513 01/26/18 0359 01/27/18 0548 01/28/18 0437 01/29/18 0700  NA 132*   < > 127* 129* 126* 124* 125*  K 2.8*   < > 4.4 4.1 3.8 3.9 4.3  CL 93*   < > 89* 91* 90* 87* 87*  CO2 26   < > 23 26 23 27 27   GLUCOSE 104*   < > 72 83 92 77 107*  BUN 22*   < > 27* 36* 36* 33* 36*  CREATININE 1.99*   < > 2.33* 2.91* 2.75* 2.51* 2.45*  CALCIUM 9.4   < > 9.3 9.4 9.0 9.2 9.1  MG 2.1  --  2.1  --   --   --   --   PHOS  --   --   --   --   --   --  3.8   < > = values in this interval not displayed.    Liver Function Tests: Recent Labs  Lab 01/23/18 0916 01/29/18 0700  AST 51*  --   ALT 24  --   ALKPHOS 145*  --   BILITOT 1.7*  --   PROT 8.2*  --   ALBUMIN 3.9  3.5   No results for input(s): LIPASE, AMYLASE in the last 168 hours. No results for input(s): AMMONIA in the last 168 hours.  CBC: Recent Labs  Lab 01/23/18 0916 01/25/18 0513  WBC 6.8 7.8  NEUTROABS 5.0  --   HGB 18.4* 18.3*  HCT 55.0* 55.2*  MCV 91.8 92.2  PLT 158 126*    Cardiac Enzymes: Recent Labs  Lab 01/23/18 0916 01/23/18 1257 01/23/18 1714 01/24/18 0008  TROPONINI 0.07* 0.08* 0.08* 0.06*    BNP: Invalid input(s): POCBNP  CBG: No results for input(s): GLUCAP in the last 168 hours.  Microbiology: No results found for this or any previous visit.  Coagulation Studies: No results for input(s): LABPROT, INR in the last 72 hours.  Urinalysis: No results for input(s): COLORURINE, LABSPEC, PHURINE, GLUCOSEU, HGBUR, BILIRUBINUR, KETONESUR, PROTEINUR, UROBILINOGEN, NITRITE, LEUKOCYTESUR in the last 72 hours.  Invalid input(s): APPERANCEUR    Imaging: US Renal  Result Date: 01/27/2018  CLINICAL DATA:  Acute kidney failure. EXAM: RENAL / URINARY TRACT ULTRASOUND COMPLETE COMPARISON:  None. FINDINGS: Right Kidney: Length: 8.5 centimeters. Normal thickness of the renal cortex. No hydronephrosis or mass. Normal echogenicity. Left Kidney: Length: 10.4 centimeters. Echogenicity within normal limits. No mass or hydronephrosis visualized. Bladder: Appears normal for degree of bladder distention. Additional: Bilateral pleural effusions are noted. IMPRESSION: 1. The RIGHT kidney is smaller than the LEFT but demonstrates normal cortical thickness. 2. No hydronephrosis or suspicious renal mass. 3. Bilateral pleural effusions. Electronically Signed   By: Nolon Nations M.D.   On: 01/27/2018 11:58   Dg Chest Port 1 View  Result Date: 01/28/2018 CLINICAL DATA:  Shortness of breath EXAM: PORTABLE CHEST 1 VIEW COMPARISON:  01/23/2018 and prior radiographs FINDINGS: Cardiomegaly, median sternotomy and AICD again noted. Slightly increased pulmonary vascular congestion noted. Small  bilateral pleural effusions and bibasilar atelectasis versus airspace disease noted in slightly increased. There is no evidence of pneumothorax. IMPRESSION: Cardiomegaly with slightly increasing pulmonary vascular congestion, small bilateral pleural effusions and bibasilar atelectasis/airspace disease. Electronically Signed   By: Margarette Canada M.D.   On: 01/28/2018 17:17     Medications:   . sodium chloride     . albuterol  2.5 mg Nebulization Q6H  . aspirin  81 mg Oral Daily  . atorvastatin  80 mg Oral Daily  . budesonide (PULMICORT) nebulizer solution  0.5 mg Nebulization BID  . carvedilol  3.125 mg Oral BID WC  . fluticasone  1 spray Each Nare Daily  . furosemide  40 mg Intravenous BID  . heparin  5,000 Units Subcutaneous Q8H  . levothyroxine  25 mcg Oral QAC breakfast  . methylPREDNISolone (SOLU-MEDROL) injection  40 mg Intravenous Daily  . sodium chloride flush  3 mL Intravenous Q12H   sodium chloride, acetaminophen, ALPRAZolam, diphenhydrAMINE, guaiFENesin, ondansetron (ZOFRAN) IV, sodium chloride flush, traZODone  Assessment/ Plan:  Chris Nguyen is a 67 y.o. black male with hypertension, congestive heart failure, atrial fibrillation, hyperlipidemia, coronary artery disease status post CABG, AICD, BPH, mitral valve repair, who was admitted to [facility] on 01/23/2018 for Mixed hyperlipidemia [E78.2] Hypokalemia [E87.6] Essential hypertension [I10] Cardiomyopathy, unspecified type ([place]) [I42.9] Acute on chronic congestive heart failure, unspecified heart failure type ([employer]) [I50.9]   1. Acute renal failure on chronic kidney disease stage III: baseline creatinine of 1.99, GFR of 38 on admission. Urine studies pending.   2. Hyponatremia: secondary to congestive heart failure and chronic kidney disease  3. Acute exacerbation of systolic and diastolic congestive heart failure   4. Hypertension: blood pressure at goal.   Plan Holding spironolactone. Examination consistent with  acute heart failure.  - Continue furosemide 40mg  IV q12 - Continue Fluid restriction     LOS: 6 Jeffifer Rabold 3/1/201911:33 AM

## 2018-01-29 NOTE — Care Management Important Message (Signed)
Important Message  Patient Details  Name: Chris Nguyen MRN: [id number] Date of Birth: 02-Dec-1950   Medicare Important Message Given:  Yes Signed IM notice given    Katrina Stack, RN 01/29/2018, 2:57 PM

## 2018-01-29 NOTE — Care Management (Addendum)
Team informed that there had been some discussion about transferring to [facility].  Attending spoke with family and there is a lack of medical necessity for transfer as would not be transferring for treatment that could not be provided at [facility].  Patient is having periods of oxygen desats as low as 73 % but recovers quickly.  Discussed with attending and care team of need to perform complete home 02 assessment on day of discharge.  Reached out to attending to see if an overnight oximetry may be of benefit.  Patient says he has been told that his oxygen drops when he sleeps.  Patient may qualify for nocturnal 02.When he discharges- will be going to this address for a while: [address]. [place] and not the [employer]. [facility] referral also made

## 2018-01-29 NOTE — Plan of Care (Signed)
Pt complaints of pain this shift, PRN benadryl given for sleep, did not rest very well still. 3L O2. BM this morning 3/1. Up with can and standby assist to the bathroom.

## 2018-01-29 NOTE — Progress Notes (Signed)
PT works with patient. During evaluation patient oxygen sat drops on RA but returns to [facility] without intervention per therapist. RN checked oxygen saturation on RA and notes the same. Pt does not appear to be any respiratory distress and no shortness of breath noted. I will continue to assess.

## 2018-01-30 LAB — CBC
HEMATOCRIT: 48.5 % (ref 40.0–52.0)
HEMOGLOBIN: 15.9 g/dL (ref 13.0–18.0)
MCH: 30.1 pg (ref 26.0–34.0)
MCHC: 32.7 g/dL (ref 32.0–36.0)
MCV: 91.8 fL (ref 80.0–100.0)
Platelets: 131 10*3/uL — ABNORMAL LOW (ref 150–440)
RBC: 5.28 MIL/uL (ref 4.40–5.90)
RDW: 14.9 % — AB (ref 11.5–14.5)
WBC: 11.8 10*3/uL — ABNORMAL HIGH (ref 3.8–10.6)

## 2018-01-30 LAB — RENAL FUNCTION PANEL
ALBUMIN: 3.6 g/dL (ref 3.5–5.0)
Anion gap: 13 (ref 5–15)
BUN: 36 mg/dL — AB (ref 6–20)
CALCIUM: 9 mg/dL (ref 8.9–10.3)
CO2: 24 mmol/L (ref 22–32)
Chloride: 91 mmol/L — ABNORMAL LOW (ref 101–111)
Creatinine, Ser: 2.38 mg/dL — ABNORMAL HIGH (ref 0.61–1.24)
GFR calc Af Amer: 31 mL/min — ABNORMAL LOW (ref >60)
GFR calc non Af Amer: 27 mL/min — ABNORMAL LOW (ref >60)
GLUCOSE: 122 mg/dL — AB (ref 65–99)
PHOSPHORUS: 3 mg/dL (ref 2.5–4.6)
Potassium: 4 mmol/L (ref 3.5–5.1)
SODIUM: 128 mmol/L — AB (ref 135–145)

## 2018-01-30 LAB — BASIC METABOLIC PANEL
ANION GAP: 11 (ref 5–15)
BUN: 34 mg/dL — AB (ref 6–20)
CO2: 25 mmol/L (ref 22–32)
Calcium: 9 mg/dL (ref 8.9–10.3)
Chloride: 92 mmol/L — ABNORMAL LOW (ref 101–111)
Creatinine, Ser: 2.21 mg/dL — ABNORMAL HIGH (ref 0.61–1.24)
GFR calc Af Amer: 34 mL/min — ABNORMAL LOW (ref >60)
GFR calc non Af Amer: 29 mL/min — ABNORMAL LOW (ref >60)
GLUCOSE: 121 mg/dL — AB (ref 65–99)
POTASSIUM: 4.1 mmol/L (ref 3.5–5.1)
Sodium: 128 mmol/L — ABNORMAL LOW (ref 135–145)

## 2018-01-30 MED ORDER — FUROSEMIDE 40 MG PO TABS
40.0000 mg | ORAL_TABLET | Freq: Two times a day (BID) | ORAL | Status: DC
  Administered 2018-01-30 – 2018-01-31 (×2): 40 mg via ORAL
  Filled 2018-01-30 (×2): qty 1

## 2018-01-30 NOTE — Progress Notes (Signed)
[facility]  ROUNDING NOTE   Subjective:   UOP 1875 Creatinine 2.21  Na 128   Objective:  Vital signs in last 24 hours:  Temp:  [97.9 F (36.6 C)-98.3 F (36.8 C)] 98.3 F (36.8 C) (03/02 0734) Pulse Rate:  [63-67] 63 (03/02 0734) Resp:  [16-18] 18 (03/02 0734) BP: (115-126)/(84-92) 123/84 (03/02 0734) SpO2:  [92 %-99 %] 96 % (03/02 0735) Weight:  [83.5 kg (184 lb)] 83.5 kg (184 lb) (03/02 0344)  Weight change: -0.998 kg (-3.2 oz) Filed Weights   01/28/18 0230 01/29/18 0445 01/30/18 0344  Weight: 84.8 kg (187 lb) 84.5 kg (186 lb 3.2 oz) 83.5 kg (184 lb)    Intake/Output: I/O last 3 completed shifts: In: 366 [P.O.:360; I.V.:6] Out: 2625 [Urine:2625]   Intake/Output this shift:  Total I/O In: 240 [P.O.:240] Out: 252 [Urine:252]  Physical Exam: General: NAD,   Head: Normocephalic, atraumatic. Moist oral mucosal membranes  Eyes: Anicteric, PERRL  Neck: Supple, trachea midline  Lungs:  Clear to auscultation  Heart: Regular rate and rhythm  Abdomen:  Soft, nontender,   Extremities: trace peripheral edema.  Neurologic: Nonfocal, moving all four extremities  Skin: No lesions       Basic Metabolic Panel: Recent Labs  Lab 01/25/18 0513 01/26/18 0359 01/27/18 0548 01/28/18 0437 01/29/18 0700 01/30/18 0654  NA 127* 129* 126* 124* 125* 128*  128*  K 4.4 4.1 3.8 3.9 4.3 4.1  4.0  CL 89* 91* 90* 87* 87* 92*  91*  CO2 23 26 23 27 27 25  24   GLUCOSE 72 83 92 77 107* 121*  122*  BUN 27* 36* 36* 33* 36* 34*  36*  CREATININE 2.33* 2.91* 2.75* 2.51* 2.45* 2.21*  2.38*  CALCIUM 9.3 9.4 9.0 9.2 9.1 9.0  9.0  MG 2.1  --   --   --   --   --   PHOS  --   --   --   --  3.8 3.0    Liver Function Tests: Recent Labs  Lab 01/29/18 0700 01/30/18 0654  ALBUMIN 3.5 3.6   No results for input(s): LIPASE, AMYLASE in the last 168 hours. No results for input(s): AMMONIA in the last 168 hours.  CBC: Recent Labs  Lab 01/25/18 0513 01/30/18 0654   WBC 7.8 11.8*  HGB 18.3* 15.9  HCT 55.2* 48.5  MCV 92.2 91.8  PLT 126* 131*    Cardiac Enzymes: Recent Labs  Lab 01/23/18 1257 01/23/18 1714 01/24/18 0008  TROPONINI 0.08* 0.08* 0.06*    BNP: Invalid input(s): POCBNP  CBG: No results for input(s): GLUCAP in the last 168 hours.  Microbiology: No results found for this or any previous visit.  Coagulation Studies: No results for input(s): LABPROT, INR in the last 72 hours.  Urinalysis: Recent Labs    01/29/18 1138  COLORURINE YELLOW*  LABSPEC 1.005  PHURINE 5.0  GLUCOSEU NEGATIVE  HGBUR SMALL*  BILIRUBINUR NEGATIVE  KETONESUR NEGATIVE  PROTEINUR NEGATIVE  NITRITE NEGATIVE  LEUKOCYTESUR NEGATIVE      Imaging: Dg Chest Port 1 View  Result Date: 01/28/2018 CLINICAL DATA:  Shortness of breath EXAM: PORTABLE CHEST 1 VIEW COMPARISON:  01/23/2018 and prior radiographs FINDINGS: Cardiomegaly, median sternotomy and AICD again noted. Slightly increased pulmonary vascular congestion noted. Small bilateral pleural effusions and bibasilar atelectasis versus airspace disease noted in slightly increased. There is no evidence of pneumothorax. IMPRESSION: Cardiomegaly with slightly increasing pulmonary vascular congestion, small bilateral pleural effusions and bibasilar atelectasis/airspace disease. Electronically  Signed   By: Margarette Canada M.D.   On: 01/28/2018 17:17     Medications:   . sodium chloride     . albuterol  2.5 mg Nebulization Q6H  . aspirin  81 mg Oral Daily  . atorvastatin  80 mg Oral Daily  . budesonide (PULMICORT) nebulizer solution  0.5 mg Nebulization BID  . carvedilol  3.125 mg Oral BID WC  . fluticasone  1 spray Each Nare Daily  . furosemide  40 mg Oral BID  . heparin  5,000 Units Subcutaneous Q8H  . levothyroxine  25 mcg Oral QAC breakfast  . methylPREDNISolone (SOLU-MEDROL) injection  40 mg Intravenous Daily  . sodium chloride flush  3 mL Intravenous Q12H   sodium chloride, acetaminophen,  ALPRAZolam, diphenhydrAMINE, guaiFENesin, ondansetron (ZOFRAN) IV, sodium chloride flush, traZODone  Assessment/ Plan:  [name] is a 67 y.o. black male with hypertension, congestive heart failure, atrial fibrillation, hyperlipidemia, coronary artery disease status post CABG, AICD, BPH, mitral valve repair, who was admitted to [facility] on 01/23/2018 for Mixed hyperlipidemia [E78.2] Hypokalemia [E87.6] Essential hypertension [I10] Cardiomyopathy, unspecified type ([place]) [I42.9] Acute on chronic congestive heart failure, unspecified heart failure type ([place]) [I50.9]   1. Acute renal failure on chronic kidney disease stage III: baseline creatinine of 1.99, GFR of 38 on admission. Mild proteinuria Acute renal failure due to acute cardiorenal syndrome Chronic kidney disease secondary to hypertension  2. Hyponatremia: secondary to congestive heart failure and chronic kidney disease  3. Acute exacerbation of systolic and diastolic congestive heart failure   4. Hypertension: blood pressure at goal.   Plan Holding spironolactone.   - Change furosemide to 40mg  PO bid.  - Continue Fluid restriction     LOS: 7 Delbert Darley 3/2/201911:00 AM

## 2018-01-30 NOTE — Progress Notes (Signed)
Patient ID: Chris Nguyen, male   DOB: 09/17/1951, 67 y.o.   MRN: [id number]  [employer] PROGRESS NOTE  Chris Nguyen JQB:[id number] DOB: Apr 30, 1951 DOA: 01/23/2018 PCP: Birdie Sons, MD  HPI/Subjective: He says he is feeling little better than yesterday.  Sodium is up to 128.  No hypoxia on overnight pulse oximetry. Objective: Vitals:   01/30/18 0734 01/30/18 0735  BP: 123/84   Pulse: 63   Resp: 18   Temp: 98.3 F (36.8 C)   SpO2:  96%    Filed Weights   01/29/18 0445 01/30/18 0344 01/30/18 1155  Weight: 84.5 kg (186 lb 3.2 oz) 83.5 kg (184 lb) 82.8 kg (182 lb 9.6 oz)    ROS: Review of Systems  Constitutional: Negative for chills and fever.  Eyes: Negative for blurred vision.  Respiratory: Negative for cough and shortness of breath.   Cardiovascular: Negative for chest pain.  Gastrointestinal: Negative for abdominal pain, constipation, diarrhea, nausea and vomiting.  Genitourinary: Negative for dysuria.  Musculoskeletal: Negative for joint pain.  Neurological: Negative for dizziness and headaches.   Exam: Physical Exam  Constitutional: He is oriented to person, place, and time.  HENT:  Nose: No mucosal edema.  Mouth/Throat: No oropharyngeal exudate or posterior oropharyngeal edema.  Eyes: Conjunctivae, EOM and lids are normal. Pupils are equal, round, and reactive to light.  Neck: No JVD present. Carotid bruit is not present. No edema present. No thyroid mass and no thyromegaly present.  Cardiovascular: S1 normal and S2 normal. Exam reveals no gallop.  No murmur heard. Pulses:      Dorsalis pedis pulses are 2+ on the right side, and 2+ on the left side.  Respiratory: No respiratory distress. He has no wheezes. He has no rhonchi. He has no rales.  GI: Soft. Bowel sounds are normal. There is no tenderness.  Musculoskeletal:       Right ankle: He exhibits swelling.       Left ankle: He exhibits swelling.  Lymphadenopathy:    He has no cervical adenopathy.   Neurological: He is alert and oriented to person, place, and time. No cranial nerve deficit.  Skin: Skin is warm. No rash noted. Nails show no clubbing.  Psychiatric: He has a normal mood and affect.      Data Reviewed: Basic Metabolic Panel: Recent Labs  Lab 01/25/18 0513 01/26/18 0359 01/27/18 0548 01/28/18 0437 01/29/18 0700 01/30/18 0654  NA 127* 129* 126* 124* 125* 128*  128*  K 4.4 4.1 3.8 3.9 4.3 4.1  4.0  CL 89* 91* 90* 87* 87* 92*  91*  CO2 23 26 23 27 27 25  24   GLUCOSE 72 83 92 77 107* 121*  122*  BUN 27* 36* 36* 33* 36* 34*  36*  CREATININE 2.33* 2.91* 2.75* 2.51* 2.45* 2.21*  2.38*  CALCIUM 9.3 9.4 9.0 9.2 9.1 9.0  9.0  MG 2.1  --   --   --   --   --   PHOS  --   --   --   --  3.8 3.0   Liver Function Tests: Recent Labs  Lab 01/29/18 0700 01/30/18 0654  ALBUMIN 3.5 3.6   CBC: Recent Labs  Lab 01/25/18 0513 01/30/18 0654  WBC 7.8 11.8*  HGB 18.3* 15.9  HCT 55.2* 48.5  MCV 92.2 91.8  PLT 126* 131*   Cardiac Enzymes: Recent Labs  Lab 01/23/18 1714 01/24/18 0008  TROPONINI 0.08* 0.06*   BNP (last 3 results) Recent  Labs    11/07/17 0834 01/23/18 0916 01/23/18 1343  BNP 724.0* 957.0* 888.0*      Studies: Dg Chest Port 1 View  Result Date: 01/28/2018 CLINICAL DATA:  Shortness of breath EXAM: PORTABLE CHEST 1 VIEW COMPARISON:  01/23/2018 and prior radiographs FINDINGS: Cardiomegaly, median sternotomy and AICD again noted. Slightly increased pulmonary vascular congestion noted. Small bilateral pleural effusions and bibasilar atelectasis versus airspace disease noted in slightly increased. There is no evidence of pneumothorax. IMPRESSION: Cardiomegaly with slightly increasing pulmonary vascular congestion, small bilateral pleural effusions and bibasilar atelectasis/airspace disease. Electronically Signed   By: Margarette Canada M.D.   On: 01/28/2018 17:17    Scheduled Meds: . albuterol  2.5 mg Nebulization Q6H  . aspirin  81 mg Oral Daily   . atorvastatin  80 mg Oral Daily  . budesonide (PULMICORT) nebulizer solution  0.5 mg Nebulization BID  . carvedilol  3.125 mg Oral BID WC  . fluticasone  1 spray Each Nare Daily  . furosemide  40 mg Oral BID  . heparin  5,000 Units Subcutaneous Q8H  . levothyroxine  25 mcg Oral QAC breakfast  . methylPREDNISolone (SOLU-MEDROL) injection  40 mg Intravenous Daily  . sodium chloride flush  3 mL Intravenous Q12H   Continuous Infusions: . sodium chloride      Assessment/Plan:  1. Acute hypoxic respiratory failure.  Acute systolic congestive heart failure.  EF 20%.  Clinically slowly improving.  Continue Lasix, changed to p.o. Lasix today.  Follow-up at CHF clinic after discharge.  Patient weight decreased 3 pounds since admission. 2. Hyponatremia due to CHF.  Improving with IV diuretics, sodium is 128.  Started on p.o. Lasix today.  Likely discharge home tomorrow. 3. Acute kidney injury on chronic kidney disease stage III.  Continue to monitor creatinine closely, improving kidney function Hyperlipidemia unspecified on atorvastatin 4. Hypothyroidism unspecified on levothyroxine .likely   Discharge home tomorrow.  Patient agreeable for this plan.    Code Status Orders  (From admission, onward)        Start     Ordered   01/23/18 1259  Full code  Continuous     01/23/18 1259    Code Status History    Date Active Date Inactive Code Status Order ID Comments User Context   01/16/2017 15:21 01/19/2017 15:03 Full Code [id number]  Vaughan Basta, MD Inpatient   02/25/2016 00:58 02/25/2016 20:16 Full Code [id number]  Chris Coon, MD ED      Disposition Plan: To be determined  Consultants:  Nephrology  Cardiology  Time spent: 30 minutes  [place]

## 2018-01-30 NOTE — Progress Notes (Signed)
Pt weight today wearing same items as last weigh in and telemetry box removed from his gown.   Pt's weight was 186.6 lb. Weight was put under VS in assessment.

## 2018-01-31 LAB — BASIC METABOLIC PANEL
ANION GAP: 14 (ref 5–15)
BUN: 40 mg/dL — AB (ref 6–20)
CHLORIDE: 93 mmol/L — AB (ref 101–111)
CO2: 26 mmol/L (ref 22–32)
Calcium: 9.4 mg/dL (ref 8.9–10.3)
Creatinine, Ser: 2.08 mg/dL — ABNORMAL HIGH (ref 0.61–1.24)
GFR calc Af Amer: 36 mL/min — ABNORMAL LOW (ref >60)
GFR calc non Af Amer: 31 mL/min — ABNORMAL LOW (ref >60)
GLUCOSE: 105 mg/dL — AB (ref 65–99)
POTASSIUM: 4.4 mmol/L (ref 3.5–5.1)
Sodium: 133 mmol/L — ABNORMAL LOW (ref 135–145)

## 2018-01-31 MED ORDER — PREDNISONE 10 MG (21) PO TBPK: ORAL_TABLET | ORAL | 0 refills | Status: DC

## 2018-01-31 MED ORDER — FUROSEMIDE 40 MG PO TABS: 40.0000 mg | ORAL_TABLET | Freq: Two times a day (BID) | ORAL | 0 refills | Status: DC

## 2018-01-31 NOTE — Care Management Note (Addendum)
Case Management Note  Patient Details  Name: Chris Nguyen MRN: [id number] Date of Birth: [birth year]  Subjective/Objective:  Discussed discharge planning with Chris Nguyen. Chris Nguyen refused offer of HH=PT stating that he doesn't think it will help him. He also refused offer of a RW "because I have a brace and a cane that I use. I don't need a walker."   Discussed 10 Sats with Chris Nguyen's nurse who reports that his 56 Sats do not drop with ambulation.                Action/Plan:   Expected Discharge Date:  01/31/18               Expected Discharge Plan:     In-House Referral:     Discharge planning Services     Post Acute Care Choice:    Choice offered to:     DME Arranged:    DME Agency:     HH Arranged:    HH Agency:     Status of Service:     If discussed at [employer], dates discussed:    Additional Comments:  Weston Fulco A, RN 01/31/2018, 9:44 AM

## 2018-01-31 NOTE — Progress Notes (Signed)
[facility]  ROUNDING NOTE   Subjective:   Sitting in chair. Off O2.   Furosemide 40mg  PO bid  UOP 2852 (1875) Creatinine 2.08 (2.21)  Na 133 (128)   Objective:  Vital signs in last 24 hours:  Temp:  [97.7 F (36.5 C)-98.3 F (36.8 C)] 97.7 F (36.5 C) (03/03 0815) Pulse Rate:  [66-70] 66 (03/03 0815) Resp:  [18] 18 (03/03 0815) BP: (123-135)/(84-90) 135/84 (03/03 0815) SpO2:  [96 %-97 %] 97 % (03/03 0815) Weight:  [81.9 kg (180 lb 9.6 oz)-82.8 kg (182 lb 9.6 oz)] 81.9 kg (180 lb 9.6 oz) (03/03 0452)  Weight change: -0.635 kg (-6.4 oz) Filed Weights   01/30/18 0344 01/30/18 1155 01/31/18 0452  Weight: 83.5 kg (184 lb) 82.8 kg (182 lb 9.6 oz) 81.9 kg (180 lb 9.6 oz)    Intake/Output: I/O last 3 completed shifts: In: 243 [P.O.:240; I.V.:3] Out: 3335 [Urine:3727; Stool:400]   Intake/Output this shift:  Total I/O In: -  Out: 250 [Urine:250]  Physical Exam: General: NAD,   Head: Normocephalic, atraumatic. Moist oral mucosal membranes  Eyes: Anicteric, PERRL  Neck: Supple, trachea midline  Lungs:  Clear to auscultation  Heart: Regular rate and rhythm  Abdomen:  Soft, nontender,   Extremities: trace peripheral edema.  Neurologic: Nonfocal, moving all four extremities  Skin: No lesions       Basic Metabolic Panel: Recent Labs  Lab 01/25/18 0513  01/27/18 0548 01/28/18 0437 01/29/18 0700 01/30/18 0654 01/31/18 0415  NA 127*   < > 126* 124* 125* 128*  128* 133*  K 4.4   < > 3.8 3.9 4.3 4.1  4.0 4.4  CL 89*   < > 90* 87* 87* 92*  91* 93*  CO2 23   < > 23 27 27 25  24 26   GLUCOSE 72   < > 92 77 107* 121*  122* 105*  BUN 27*   < > 36* 33* 36* 34*  36* 40*  CREATININE 2.33*   < > 2.75* 2.51* 2.45* 2.21*  2.38* 2.08*  CALCIUM 9.3   < > 9.0 9.2 9.1 9.0  9.0 9.4  MG 2.1  --   --   --   --   --   --   PHOS  --   --   --   --  3.8 3.0  --    < > = values in this interval not displayed.    Liver Function Tests: Recent Labs  Lab  01/29/18 0700 01/30/18 0654  ALBUMIN 3.5 3.6   No results for input(s): LIPASE, AMYLASE in the last 168 hours. No results for input(s): AMMONIA in the last 168 hours.  CBC: Recent Labs  Lab 01/25/18 0513 01/30/18 0654  WBC 7.8 11.8*  HGB 18.3* 15.9  HCT 55.2* 48.5  MCV 92.2 91.8  PLT 126* 131*    Cardiac Enzymes: No results for input(s): CKTOTAL, CKMB, CKMBINDEX, TROPONINI in the last 168 hours.  BNP: Invalid input(s): POCBNP  CBG: No results for input(s): GLUCAP in the last 168 hours.  Microbiology: No results found for this or any previous visit.  Coagulation Studies: No results for input(s): LABPROT, INR in the last 72 hours.  Urinalysis: Recent Labs    01/29/18 1138  COLORURINE YELLOW*  LABSPEC 1.005  PHURINE 5.0  GLUCOSEU NEGATIVE  HGBUR SMALL*  BILIRUBINUR NEGATIVE  KETONESUR NEGATIVE  PROTEINUR NEGATIVE  NITRITE NEGATIVE  LEUKOCYTESUR NEGATIVE      Imaging: No results found.  Medications:   . sodium chloride     . albuterol  2.5 mg Nebulization Q6H  . aspirin  81 mg Oral Daily  . atorvastatin  80 mg Oral Daily  . budesonide (PULMICORT) nebulizer solution  0.5 mg Nebulization BID  . carvedilol  3.125 mg Oral BID WC  . fluticasone  1 spray Each Nare Daily  . furosemide  40 mg Oral BID  . heparin  5,000 Units Subcutaneous Q8H  . levothyroxine  25 mcg Oral QAC breakfast  . methylPREDNISolone (SOLU-MEDROL) injection  40 mg Intravenous Daily  . sodium chloride flush  3 mL Intravenous Q12H   sodium chloride, acetaminophen, ALPRAZolam, diphenhydrAMINE, guaiFENesin, ondansetron (ZOFRAN) IV, sodium chloride flush, traZODone  Assessment/ Plan:  [name] is a 67 y.o. black male with hypertension, congestive heart failure, atrial fibrillation, hyperlipidemia, coronary artery disease status post CABG, AICD, BPH, mitral valve repair, who was admitted to [facility] on 01/23/2018 for Mixed hyperlipidemia [E78.2] Hypokalemia [E87.6] Essential  hypertension [I10] Cardiomyopathy, unspecified type ([place]) [I42.9] Acute on chronic congestive heart failure, unspecified heart failure type ([place]) [I50.9]   1. Acute renal failure on chronic kidney disease stage III: baseline creatinine of 1.99, GFR of 38 on admission. Mild proteinuria Acute renal failure due to acute cardiorenal syndrome Chronic kidney disease secondary to hypertension - Consider restarting lisinopril on discharge  2. Hyponatremia: secondary to congestive heart failure and chronic kidney disease. Improved to 133 - Continue fluid restriction  3. Acute exacerbation of systolic and diastolic congestive heart failure - daily weights.  - Currently holding spironolactone.  - Continue furosemide 40mg  PO bid   4. Hypertension: blood pressure at goal. Takes metoprolol as outpatient.    LOS: 8 Chris Nguyen 3/3/20199:31 AM

## 2018-01-31 NOTE — Progress Notes (Signed)
stablefor discharge.  Hyponatremia corrected. #1 acute systolic heart failure: Improved.  Weight is down, continue Lasix 40 mg p.o. twice daily for 3 days and then just restart 40 mg daily after that.  Follow-up with excellent #2 acute renal failure on chronic kidney disease stage III, baseline creatinine 1.99.  Acute renal failure secondary to acute cardiorenal syndrome.  Restarted lisinopril at discharge.  Patient can follow-up with Dr. Juleen China as an outpatient.  Hyponatremia corrected.  Discussed the medication list with patient and also discussed with nurse the patient can be discharged.

## 2018-01-31 NOTE — Plan of Care (Signed)
  Health Behavior/Discharge Planning: Ability to manage health-related needs will improve 01/31/2018 0023 - Progressing by Marylouise Stacks, RN   Clinical Measurements: Ability to maintain clinical measurements within normal limits will improve 01/31/2018 0023 - Progressing by Marylouise Stacks, RN   Clinical Measurements: Diagnostic test results will improve 01/31/2018 0023 - Progressing by Marylouise Stacks, RN   Clinical Measurements: Cardiovascular complication will be avoided 01/31/2018 0023 - Progressing by Marylouise Stacks, RN   Clinical Measurements: Respiratory complications will improve 01/31/2018 0023 - Progressing by Marylouise Stacks, RN

## 2018-01-31 NOTE — Discharge Planning (Signed)
Discharge instructions reviewed with pt. Pt verbalizes understanding. Pt ready for discharge home. 

## 2018-02-01 ENCOUNTER — Other Ambulatory Visit: Payer: Self-pay | Admitting: *Deleted

## 2018-02-01 NOTE — Patient Outreach (Signed)
Unsuccessful telephone encounter to Binnie Rail, 67 year old male- follow up on in basket received today from [place] - referral made by  [facility]  RN CM on 01/29/18 for Drain services, follow up on recent hospitalization.  Reason for consult- CHF complicated by renal issues with diuresis.  View in EMR recent hospitalization February 23 to March 3,2019 for CHF.   Pt's history includes but not limited to Hypertension, Hyperlipidemia, CAD, BPH, Atrial fib, CKD stage III, Hypothyroid.  HIPAA  Compliant voice message left with RN CM's contact information.    Plan:  If no response to voice message left, plan to follow up again  Tomorrow telephonically.   Zara Chess.   [place] [facility]  [phone]

## 2018-02-02 ENCOUNTER — Other Ambulatory Visit: Payer: Self-pay | Admitting: *Deleted

## 2018-02-02 ENCOUNTER — Encounter: Payer: Self-pay | Admitting: *Deleted

## 2018-02-02 ENCOUNTER — Ambulatory Visit: Payer: Self-pay | Admitting: Family

## 2018-02-02 NOTE — Patient Outreach (Signed)
Second unsuccessful telephone encounter to Chris Nguyen, 67 year old male- follow up on referral made by [facility] RN CM on 01/29/18 for [place] services- follow up on recent hospitalization February 23-March 3,2019 for CHF.  Reason for consult- CHF complicated by renal issues with diuresis.  HIPAA compliant voice message left with RN CM's contact information.   Plan:  With this being the second call, unable to contact letter to be sent out and if no response in 10 business days from first call, will make third call then.    Zara Chess.   [place] [facility]  [phone]

## 2018-02-03 NOTE — Discharge Summary (Signed)
Chris Nguyen, is a 67 y.o. male  DOB 1951-09-12  MRN [id number].  Admission date:  01/23/2018  Admitting Physician  Gorden Harms, MD  Discharge Date:  01/31/2018   Primary MD  Birdie Sons, MD  Recommendations for primary care physician for things to follow:   Follow-up with PCP in 1 week Follow-up with Dr. Lorin Glass from nephrology in 2 weeks  Admission Diagnosis  Mixed hyperlipidemia [E78.2] Hypokalemia [E87.6] Essential hypertension [I10] Cardiomyopathy, unspecified type ([place]) [I42.9] Acute on chronic congestive heart failure, unspecified heart failure type ([place]) [I50.9]   Discharge Diagnosis  Mixed hyperlipidemia [E78.2] Hypokalemia [E87.6] Essential hypertension [I10] Cardiomyopathy, unspecified type ([place]) [I42.9] Acute on chronic congestive heart failure, unspecified heart failure type ([place]) [I50.9]    Active Problems:   CHF (congestive heart failure) ([place])      Past Medical History:  Diagnosis Date  . AICD (automatic cardioverter/defibrillator) present   . BPH (benign prostatic hyperplasia)   . CAD (coronary artery disease) 05/14/2006  . Cardiac arrest ([place]) 11/12/2013   Overview:  December 2014: ICD implanted  Last Assessment & Plan:  Relevant Hx: Course: Daily Update: Today's Plan:   . Chronic systolic CHF (congestive heart failure) ([place])   . Erectile dysfunction   . H/O coronary artery bypass surgery 05/17/2015   Overview:  LIMA to LAD   . HLD (hyperlipidemia)   . Osteoarthritis of left wrist   . Paroxysmal atrial fibrillation [facility])     Past Surgical History:  Procedure Laterality Date  . CARDIAC CATHETERIZATION  06/27/2008  . CARPAL TUNNEL RELEASE    . CORONARY ARTERY BYPASS GRAFT  2006 and 2014   LIMA to LAD at [facility] 2014  . MITRAL VALVE REPAIR         History of present illness and   Hospital Course:     Kindly see H&P for history of present illness and admission details, please review complete Labs, Consult reports and Test reports for all details in brief  HPI  from the history and physical done on the day of admission 67 year old male patient with history of chronic systolic heart with EF 44%, hypothyroidism came in because of shortness of breath, orthopnea, PND, pedal edema admitted for acute on chronic systolic heart failure.   Hospital Course  : Acute on chronic systolic heart failure: Patient had AICD.  Received IV Lasix for 1 week to decrease the swelling of the feet, orthopnea, PND.  Seen by [facility] in cardiology.  Echocardiogram this time showed EF 20-25%.  No new changes.   Pt can take Lasix 40 mg p.o. daily for 3 days and then resume 40 mg daily after that.  Restarted lisinopril at discharge.  Patient can continue to metoprolol. 2.  Acute severe hypokalemia, potassium  replaced during the hospital stay, potassium was 2.8 when he came, improved to 4.4 if they placement. 3.  Acute on chronic renal failure with chronic kidney disease stage III with baseline creatinine around 1.9,  Patient had worsening kidney function with creatinine up to 2.91.  Seen by nephrology, because of hyponatremia with sodium level as low as 124, patient thought to have cardiorenal syndrome.  We continued IV Lasix, he showed improvement in kidney function with creatinine up to 2.08, GFR 36. 4.  Severe hyponatremia secondary to CHF: Received IV Lasix, sodium improved from 124-133 gradually.  Sodium improved gradually. Acute bronchitis.  Patient received IV steroids during the hospital stay, discharged home with tapering  course of prednisone.  Discharge  Condition: Stable   Follow UP  Follow-up Information    [facility] Follow up on 02/02/2018.   Specialty:  Cardiology Why:   at 9:20am Contact information: [place]  [address] [phone]       Birdie Sons, MD Follow up in 1 week(s).   Specialty:  Family Medicine Contact information: [address] [place] [address] [phone]        Lavonia Dana, MD. Call in 1 week(s).   Specialty:  Internal Medicine Contact information: [address] [place] [address] [phone]             Discharge Instructions  and  Discharge Medications    Discharge Instructions    Amb Referral to HF Clinic   Complete by:  As directed      Allergies as of 01/31/2018      Reactions   Spironolactone Other (See Comments)   Hyperkalemia   Entresto [sacubitril-valsartan] Diarrhea   Ziac [bisoprolol-hydrochlorothiazide]       Medication List    TAKE these medications   aspirin 81 MG chewable tablet Chew 81 mg by mouth daily.   atorvastatin 80 MG tablet Commonly known as:  LIPITOR TAKE 1 TABLET EVERY DAY   furosemide 40 MG tablet Commonly known as:  LASIX Take 1 tablet (40 mg total) by mouth 2 (two) times daily.   levothyroxine 25 MCG tablet Commonly known as:  SYNTHROID, LEVOTHROID Take 1 tablet (25 mcg total) by mouth daily before breakfast.   lisinopril 20 MG tablet Commonly known as:  PRINIVIL,ZESTRIL TAKE 1 TABLET EVERY DAY   metoprolol succinate 50 MG 24 hr tablet Commonly known as:  TOPROL-XL Take 1 tablet (50 mg total) by mouth daily.   predniSONE 10 MG (21) Tbpk tablet Commonly known as:  STERAPRED UNI-PAK 21 TAB Taper by 10  Mg daily         Diet and Activity recommendation: See Discharge Instructions above   Consults obtained -cardiology, nephrology.   Major procedures and Radiology Reports - PLEASE review detailed and final reports for all details, in brief -     Dg Chest 2 View  Result Date: 01/23/2018 CLINICAL DATA:  Cough and shortness of breath EXAM: CHEST  2 VIEW COMPARISON:  November 07, 2017 FINDINGS: Small bilateral pleural  effusions with underlying atelectasis. Mild pulmonary venous congestion without overt edema. Stable AICD device. Cardiomegaly. No other changes. IMPRESSION: Cardiomegaly, pulmonary venous congestion, and small bilateral pleural effusions. Electronically Signed   By: Dorise Bullion III M.D   On: 01/23/2018 10:07   US Renal  Result Date: 01/27/2018 CLINICAL DATA:  Acute kidney failure. EXAM: RENAL / URINARY TRACT ULTRASOUND COMPLETE COMPARISON:  None. FINDINGS: Right Kidney: Length: 8.5 centimeters. Normal thickness of the renal cortex. No hydronephrosis or mass. Normal echogenicity. Left Kidney: Length: 10.4 centimeters. Echogenicity within normal limits. No mass or hydronephrosis visualized. Bladder: Appears normal for degree of bladder distention. Additional: Bilateral pleural effusions are noted. IMPRESSION: 1. The RIGHT kidney is smaller than the LEFT but demonstrates normal cortical thickness. 2. No hydronephrosis or suspicious renal mass. 3. Bilateral pleural effusions. Electronically Signed   By: Nolon Nations M.D.   On: 01/27/2018 11:58   Dg Chest Port 1 View  Result Date: 01/28/2018 CLINICAL DATA:  Shortness of breath EXAM: PORTABLE CHEST 1 VIEW COMPARISON:  01/23/2018 and prior radiographs FINDINGS: Cardiomegaly, median sternotomy and AICD again noted. Slightly  increased pulmonary vascular congestion noted. Small bilateral pleural effusions and bibasilar atelectasis versus airspace disease noted in slightly increased. There is no evidence of pneumothorax. IMPRESSION: Cardiomegaly with slightly increasing pulmonary vascular congestion, small bilateral pleural effusions and bibasilar atelectasis/airspace disease. Electronically Signed   By: Margarette Canada M.D.   On: 01/28/2018 17:17    Micro Results    No results found for this or any previous visit (from the past 240 hour(s)).     Today   Subjective:   Chris Nguyen today has no headache,no chest abdominal pain,no new weakness tingling  or numbness, feels much better wants to go home today.  Objective:   Blood pressure 135/84, pulse 66, temperature 97.7 F (36.5 C), temperature source Oral, resp. rate 18, height 5\' 10"  (1.778 m), weight 81.9 kg (180 lb 9.6 oz), SpO2 97 %.  No intake or output data in the 24 hours ending 02/03/18 1341  Exam Awake Alert, Oriented x 3, No new F.N deficits, Normal affect [place].AT,PERRAL Supple Neck,No JVD, No cervical lymphadenopathy appriciated.  Symmetrical Chest wall movement, Good air movement bilaterally, CTAB RRR,No Gallops,Rubs or new Murmurs, No Parasternal Heave +ve B.Sounds, Abd Soft, Non tender, No organomegaly appriciated, No rebound -guarding or rigidity. No Cyanosis, Clubbing or edema, No new Rash or bruise  Data Review   CBC w Diff:  Lab Results  Component Value Date   WBC 11.8 (H) 01/30/2018   HGB 15.9 01/30/2018   HGB 16.5 02/16/2017   HCT 48.5 01/30/2018   HCT 50.2 02/16/2017   PLT 131 (L) 01/30/2018   PLT 205 02/16/2017   LYMPHOPCT 10 01/23/2018   MONOPCT 15 01/23/2018   EOSPCT 1 01/23/2018   BASOPCT 1 01/23/2018    CMP:  Lab Results  Component Value Date   NA 133 (L) 01/31/2018   NA 144 02/16/2017   NA 142 02/26/2015   K 4.4 01/31/2018   K 4.4 02/26/2015   CL 93 (L) 01/31/2018   CL 100 02/26/2015   CO2 26 01/31/2018   CO2 26 02/26/2015   CO2 20 (L) 11/04/2013   BUN 40 (H) 01/31/2018   BUN 21 02/16/2017   BUN 16 02/26/2015   CREATININE 2.08 (H) 01/31/2018   CREATININE 2.04 (H) 11/05/2017   GLU 86 05/12/2014   PROT 8.2 (H) 01/23/2018   PROT 7.8 12/19/2016   PROT 5.9 (L) 11/04/2013   ALBUMIN 3.6 01/30/2018   ALBUMIN 4.6 12/19/2016   ALBUMIN 4.5 02/26/2015   ALBUMIN 2.8 (L) 11/04/2013   BILITOT 1.7 (H) 01/23/2018   BILITOT 0.6 12/19/2016   BILITOT 0.4 11/04/2013   ALKPHOS 145 (H) 01/23/2018   ALKPHOS 120 (H) 11/04/2013   AST 51 (H) 01/23/2018   AST 63 (H) 11/04/2013   ALT 24 01/23/2018   ALT 53 11/04/2013  .   Total Time in preparing  paper work, data evaluation and todays exam - 50 minutes  Epifanio Lesches M.D on 01/31/2018 at 1:41 PM    Note: This dictation was prepared with Dragon dictation along with smaller phrase technology. Any transcriptional errors that result from this process are unintentional.

## 2018-02-15 ENCOUNTER — Other Ambulatory Visit: Payer: Self-pay | Admitting: *Deleted

## 2018-02-15 ENCOUNTER — Encounter: Payer: Self-pay | Admitting: *Deleted

## 2018-02-15 NOTE — Patient Outreach (Addendum)
Third unsuccessful telephone encounter to Chris Nguyen, 67 year old male- follow up on referral received  from [facility] on 01/29/18 for [facility] services- follow up on recent hospitalization February 23-March 3,2019 for  CHF.  Reason for consult- CHF complicated by renal issues with diuresis.   HIPAA compliant voice message left with RN CM's  contact information.   Plan: With this being the third call and  no response in 10 business  to unable to contact letter sent to pt, plan to close case.           Plan to inform Dr. Caryn Section of referral/unable to contact pt after numerous attempts- send case closure letter.           Plan to inform [facility] CMA to close case.    Zara Chess.   [facility]  [phone]

## 2018-02-16 ENCOUNTER — Encounter: Payer: Self-pay | Admitting: Family Medicine

## 2018-02-16 ENCOUNTER — Ambulatory Visit (INDEPENDENT_AMBULATORY_CARE_PROVIDER_SITE_OTHER): Payer: Medicare HMO | Admitting: Family Medicine

## 2018-02-16 VITALS — BP 130/90 | HR 78 | Temp 97.6°F | Resp 18

## 2018-02-16 DIAGNOSIS — E039 Hypothyroidism, unspecified: Secondary | ICD-10-CM

## 2018-02-16 DIAGNOSIS — E871 Hypo-osmolality and hyponatremia: Secondary | ICD-10-CM | POA: Diagnosis not present

## 2018-02-16 DIAGNOSIS — J309 Allergic rhinitis, unspecified: Secondary | ICD-10-CM

## 2018-02-16 DIAGNOSIS — I5020 Unspecified systolic (congestive) heart failure: Secondary | ICD-10-CM | POA: Diagnosis not present

## 2018-02-16 NOTE — Progress Notes (Signed)
Patient: Chris Nguyen Male    DOB: 06-06-1951   67 y.o.   MRN: [id number] Visit Date: 02/16/2018  Today's Provider: Lelon Huh, MD   Chief Complaint  Patient presents with  . Follow-up   Subjective:    HPI  Hypertensive cardiomyopathy, with heart failure ([place]) Admitted 01/31/2018 through 01/31/2018 for CHF exacerbation and hypokalemia. Aggressively diuresed and replenished potassium via IVF,  and seen by Dr. Clayborn Bigness in the hospital. Discharged on 40mg  furosemide.BID and has cut down to 40 once a day, but allowed to take a second when swelling is worse, and has only had to take a few times since discharge.   Seen by renal for hyponatremia secondary to cardiorenal syndrome. He was to follow up Dr. Juleen China after a week, but he has not done so yet.Marland KitchenHe was given IV steroids for bronchitis and discharged on 6 day prednisone taper.  Has follow up with Dr. Clayborn Bigness in June. Has also been back on drug study medication   (Jardiance) since discharge  He states that swelling has greatly improved but still feels very fatigued. States breathing is better during the day, but has trouble breathing through nose at night.   Hypothyroidism, unspecified type From 01/19/2018-started low dose of levothyroxine 25 mcg previously presciribed and follow up in 1 month. Lab Results  Component Value Date   TSH 21.056 (H) 01/23/2018        Allergies  Allergen Reactions  . Spironolactone Other (See Comments)    Hyperkalemia  . Entresto [Sacubitril-Valsartan] Diarrhea  . Ziac [Bisoprolol-Hydrochlorothiazide]      Current Outpatient Medications:  .  aspirin 81 MG chewable tablet, Chew 81 mg by mouth daily. , Disp: , Rfl:  .  atorvastatin (LIPITOR) 80 MG tablet, TAKE 1 TABLET EVERY DAY, Disp: 90 tablet, Rfl: 3 .  empagliflozin (JARDIANCE) 10 MG TABS tablet, Take 10 mg by mouth daily., Disp: , Rfl:  .  furosemide (LASIX) 40 MG tablet, Take 1 tablet (40 mg total) by mouth 2 (two) times daily.,  Disp: 30 tablet, Rfl: 0 .  levothyroxine (SYNTHROID, LEVOTHROID) 25 MCG tablet, Take 1 tablet (25 mcg total) by mouth daily before breakfast., Disp: 30 tablet, Rfl: 0 .  lisinopril (PRINIVIL,ZESTRIL) 20 MG tablet, TAKE 1 TABLET EVERY DAY, Disp: 90 tablet, Rfl: 4 .  metoprolol succinate (TOPROL-XL) 50 MG 24 hr tablet, Take 1 tablet (50 mg total) by mouth daily., Disp: 90 tablet, Rfl: 3 .  predniSONE (STERAPRED UNI-PAK 21 TAB) 10 MG (21) TBPK tablet, Taper by 10  Mg daily (Patient not taking: Reported on 02/16/2018), Disp: 21 tablet, Rfl: 0  Review of Systems  Constitutional: Negative for appetite change, chills and fever.  Respiratory: Negative for chest tightness, shortness of breath and wheezing.   Cardiovascular: Negative for chest pain and palpitations.  Gastrointestinal: Negative for abdominal pain, nausea and vomiting.    Social History   Tobacco Use  . Smoking status: Former Smoker    Last attempt to quit: 12/01/2004    Years since quitting: 13.2  . Smokeless tobacco: Never Used  Substance Use Topics  . Alcohol use: Yes    Alcohol/week: 4.2 - 5.4 oz    Types: 1 Glasses of wine, 6 - 8 Cans of beer per week   Objective:   BP 130/90 (BP Location: Right Arm, Patient Position: Sitting, Cuff Size: Normal)   Pulse 78   Temp 97.6 F (36.4 C) (Oral)   Resp 18   SpO2 99%  Vitals:   02/16/18 0948  BP: 130/90  Pulse: 78  Resp: 18  Temp: 97.6 F (36.4 C)  TempSrc: Oral  SpO2: 99%     Physical Exam   General Appearance:    Alert, cooperative, no distress  ENT:   Mild nasal congestion.   Eyes:    PERRL, conjunctiva/corneas clear, EOM's intact       Lungs:     Clear to auscultation bilaterally, respirations unlabored  Heart:    Regular rate and rhythm. Trace pedal edema  Neurologic:   Awake, alert, oriented x 3. No apparent focal neurological           defect.           Assessment & Plan:     1. Hypothyroidism, unspecified type Doing well with initiation of  levothyroxine.  - TSH  2. Systolic heart failure, unspecified HF chronicity ([place]) Fairly well compensated, but still very fatigued.  - Comprehensive metabolic panel  3. Hyponatremia Check electrolytes as above. He has not followed up with nephrology yet. If any significant renal function abnormalities will refer.   4. Allergic rhinitis Advised he can take OTC fexofenadine instead of Claritin which he doesn't feel has been effective.        Lelon Huh, MD  [facility]

## 2018-02-16 NOTE — Patient Instructions (Signed)
   You can take OTC fexofenadine (Allegra) and fluticasone nasal spray (Flonas) for allergies.

## 2018-02-17 ENCOUNTER — Telehealth: Payer: Self-pay | Admitting: *Deleted

## 2018-02-17 ENCOUNTER — Telehealth: Payer: Self-pay

## 2018-02-17 LAB — COMPREHENSIVE METABOLIC PANEL
A/G RATIO: 1.2 (ref 1.2–2.2)
ALK PHOS: 247 IU/L — AB (ref 39–117)
ALT: 35 IU/L (ref 0–44)
AST: 33 IU/L (ref 0–40)
Albumin: 3.8 g/dL (ref 3.6–4.8)
BILIRUBIN TOTAL: 1.3 mg/dL — AB (ref 0.0–1.2)
BUN/Creatinine Ratio: 12 (ref 10–24)
BUN: 22 mg/dL (ref 8–27)
CALCIUM: 9.5 mg/dL (ref 8.6–10.2)
CHLORIDE: 99 mmol/L (ref 96–106)
CO2: 26 mmol/L (ref 20–29)
Creatinine, Ser: 1.9 mg/dL — ABNORMAL HIGH (ref 0.76–1.27)
GFR calc Af Amer: 41 mL/min/{1.73_m2} — ABNORMAL LOW (ref >59)
GFR, EST NON AFRICAN AMERICAN: 36 mL/min/{1.73_m2} — AB (ref >59)
GLOBULIN, TOTAL: 3.1 g/dL (ref 1.5–4.5)
Glucose: 70 mg/dL (ref 65–99)
POTASSIUM: 3.5 mmol/L (ref 3.5–5.2)
SODIUM: 146 mmol/L — AB (ref 134–144)
Total Protein: 6.9 g/dL (ref 6.0–8.5)

## 2018-02-17 LAB — TSH: TSH: 19.49 u[IU]/mL — ABNORMAL HIGH (ref 0.450–4.500)

## 2018-02-17 MED ORDER — LEVOTHYROXINE SODIUM 50 MCG PO TABS: 50.0000 ug | ORAL_TABLET | Freq: Every day | ORAL | 0 refills | Status: DC

## 2018-02-17 NOTE — Telephone Encounter (Signed)
-----   Message from Birdie Sons, MD sent at 02/17/2018  7:44 AM EDT ----- Kidney functions better. Still hyPOthyroid. Need to increase levothyroxine to 12mcg daily, #30 and recheck TSH and renal in 4 weeks.

## 2018-02-17 NOTE — Telephone Encounter (Signed)
Patient was notified of results. Expressed understanding. Rx sent to pharmacy. 

## 2018-02-17 NOTE — Telephone Encounter (Signed)
LMOVM for pt to return call 

## 2018-02-17 NOTE — Telephone Encounter (Signed)
Called pt to schedule AWV and CPE and pt declines at this time and states he is not well. Pt will CB if he is interested in the future.  -MM

## 2018-02-24 ENCOUNTER — Telehealth: Payer: Self-pay

## 2018-02-24 MED ORDER — LEVOTHYROXINE SODIUM 25 MCG PO TABS: ORAL_TABLET | ORAL | 0 refills | Status: DC

## 2018-02-24 NOTE — Telephone Encounter (Signed)
Patient called the office reporting that every time he takes the new dose of Levothyroxine he feels drunk. We increased Levothyroxine to 73mcg 1 week ago. Patient says he takes this medication in the mornings and every time he takes it he starts to feel woozy, dizzy and off balance. This happens every morning shortly after taking the Levothyroxine 25mcg. These symptoms wear off as the day goes on, then return the next morning after taking the medication. Please advise on what patient should do.

## 2018-02-24 NOTE — Telephone Encounter (Signed)
He is probably just very sensitive to thyroid hormone since his levels have been so low. Suggest he go back to 43mcg but take one in the morning and one in the evening. #60, no refills. Will recheck levels in 4 weeks.

## 2018-02-24 NOTE — Telephone Encounter (Signed)
Advised patient as below. Medication was sent into the pharmacy.  

## 2018-03-03 DIAGNOSIS — K219 Gastro-esophageal reflux disease without esophagitis: Secondary | ICD-10-CM | POA: Diagnosis not present

## 2018-03-03 DIAGNOSIS — N183 Chronic kidney disease, stage 3 (moderate): Secondary | ICD-10-CM | POA: Diagnosis not present

## 2018-03-03 DIAGNOSIS — I480 Paroxysmal atrial fibrillation: Secondary | ICD-10-CM | POA: Diagnosis not present

## 2018-03-03 DIAGNOSIS — R0602 Shortness of breath: Secondary | ICD-10-CM | POA: Diagnosis not present

## 2018-03-03 DIAGNOSIS — I255 Ischemic cardiomyopathy: Secondary | ICD-10-CM | POA: Diagnosis not present

## 2018-03-03 DIAGNOSIS — I5022 Chronic systolic (congestive) heart failure: Secondary | ICD-10-CM | POA: Diagnosis not present

## 2018-03-03 DIAGNOSIS — Z95810 Presence of automatic (implantable) cardiac defibrillator: Secondary | ICD-10-CM | POA: Diagnosis not present

## 2018-03-03 DIAGNOSIS — I10 Essential (primary) hypertension: Secondary | ICD-10-CM | POA: Diagnosis not present

## 2018-03-03 DIAGNOSIS — G629 Polyneuropathy, unspecified: Secondary | ICD-10-CM | POA: Diagnosis not present

## 2018-03-04 NOTE — Telephone Encounter (Signed)
complete

## 2018-04-12 ENCOUNTER — Telehealth: Payer: Self-pay | Admitting: Family Medicine

## 2018-04-12 DIAGNOSIS — E871 Hypo-osmolality and hyponatremia: Secondary | ICD-10-CM

## 2018-04-12 DIAGNOSIS — E039 Hypothyroidism, unspecified: Secondary | ICD-10-CM

## 2018-04-12 NOTE — Telephone Encounter (Signed)
Patient was advised. Lab slip printed.  

## 2018-04-12 NOTE — Telephone Encounter (Signed)
Please advise that it is time to check tsh and kidney functions  since increase levothyroxine in march. Please print pended order and leave at lab for patient. Thanks.

## 2018-04-12 NOTE — Telephone Encounter (Signed)
-----   Message from Birdie Sons, MD sent at 03/31/2018  7:59 AM EDT ----- Regarding: FW: recheck thyroid functions mid may   ----- Message ----- From: Birdie Sons, MD Sent: 03/28/2018 To: Birdie Sons, MD Subject: FW: recheck thyroid functions last week of A#    ----- Message ----- From: Birdie Sons, MD Sent: 03/22/2018 To: Birdie Sons, MD Subject: recheck thyroid functions last week of April

## 2018-04-13 DIAGNOSIS — E871 Hypo-osmolality and hyponatremia: Secondary | ICD-10-CM | POA: Diagnosis not present

## 2018-04-13 DIAGNOSIS — E039 Hypothyroidism, unspecified: Secondary | ICD-10-CM | POA: Diagnosis not present

## 2018-04-14 LAB — RENAL FUNCTION PANEL
ALBUMIN: 3.7 g/dL (ref 3.6–4.8)
BUN/Creatinine Ratio: 10 (ref 10–24)
BUN: 15 mg/dL (ref 8–27)
CALCIUM: 9.4 mg/dL (ref 8.6–10.2)
CHLORIDE: 96 mmol/L (ref 96–106)
CO2: 29 mmol/L (ref 20–29)
Creatinine, Ser: 1.53 mg/dL — ABNORMAL HIGH (ref 0.76–1.27)
GFR calc Af Amer: 54 mL/min/{1.73_m2} — ABNORMAL LOW (ref >59)
GFR calc non Af Amer: 46 mL/min/{1.73_m2} — ABNORMAL LOW (ref >59)
GLUCOSE: 102 mg/dL — AB (ref 65–99)
POTASSIUM: 3.3 mmol/L — AB (ref 3.5–5.2)
Phosphorus: 2.4 mg/dL — ABNORMAL LOW (ref 2.5–4.5)
Sodium: 142 mmol/L (ref 134–144)

## 2018-04-14 LAB — TSH: TSH: 13.2 u[IU]/mL — AB (ref 0.450–4.500)

## 2018-05-13 ENCOUNTER — Other Ambulatory Visit: Payer: Self-pay | Admitting: Family Medicine

## 2018-05-13 DIAGNOSIS — I429 Cardiomyopathy, unspecified: Secondary | ICD-10-CM

## 2018-05-13 DIAGNOSIS — I110 Hypertensive heart disease with heart failure: Secondary | ICD-10-CM

## 2018-05-13 DIAGNOSIS — I43 Cardiomyopathy in diseases classified elsewhere: Secondary | ICD-10-CM

## 2018-05-13 DIAGNOSIS — I5023 Acute on chronic systolic (congestive) heart failure: Secondary | ICD-10-CM

## 2018-07-31 IMAGING — CR DG CHEST 2V
1 series · 2 of 2 positions shown · non-contrast
Comparison: Chest x-ray dated 02/24/2016.

CLINICAL DATA: Shortness of breath for the past month, fatigue,
chest tightness.

EXAM:
CHEST  2 VIEW

[Series 1: dg chest 2 view · 0.14mm/px · 2 of 2 slices shown]
[im 1/2]
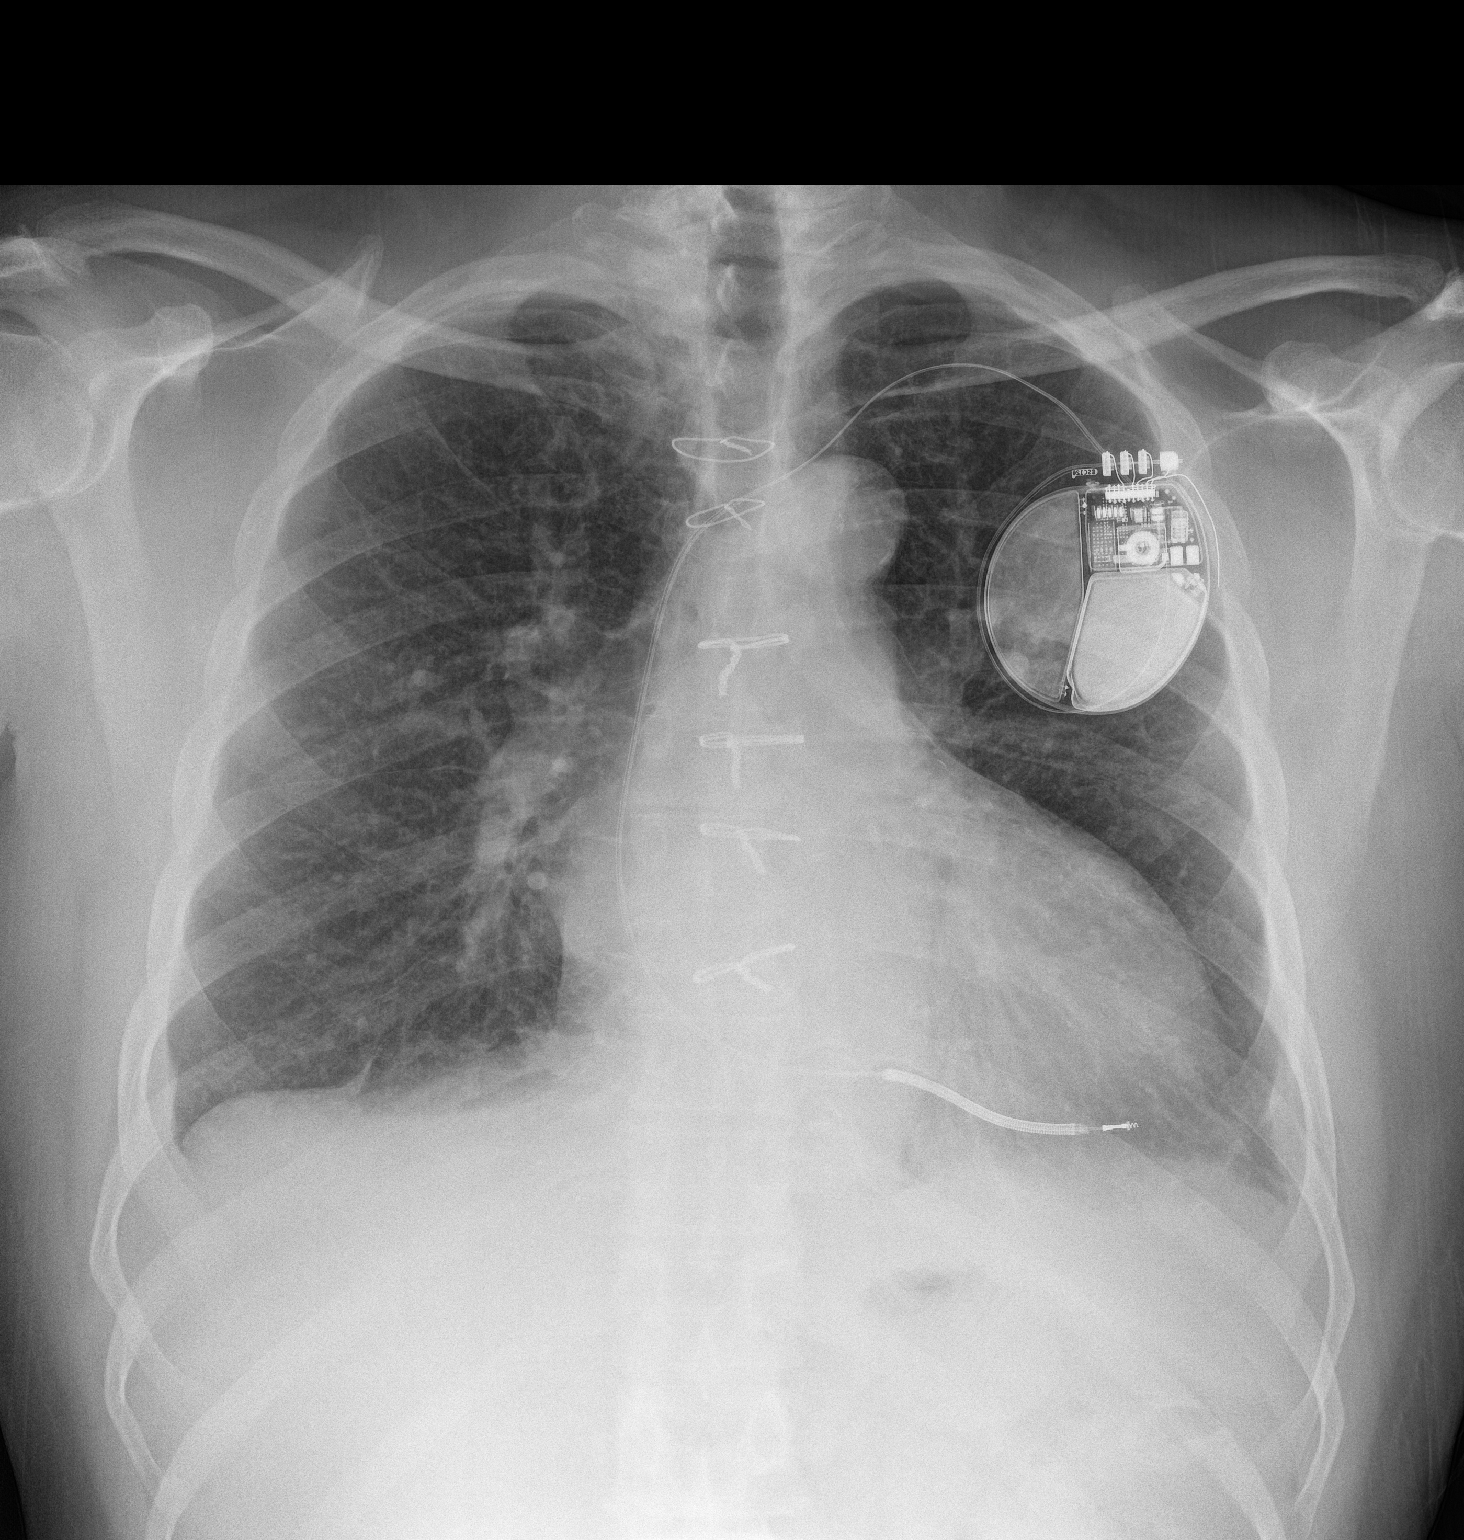
[im 2/2]
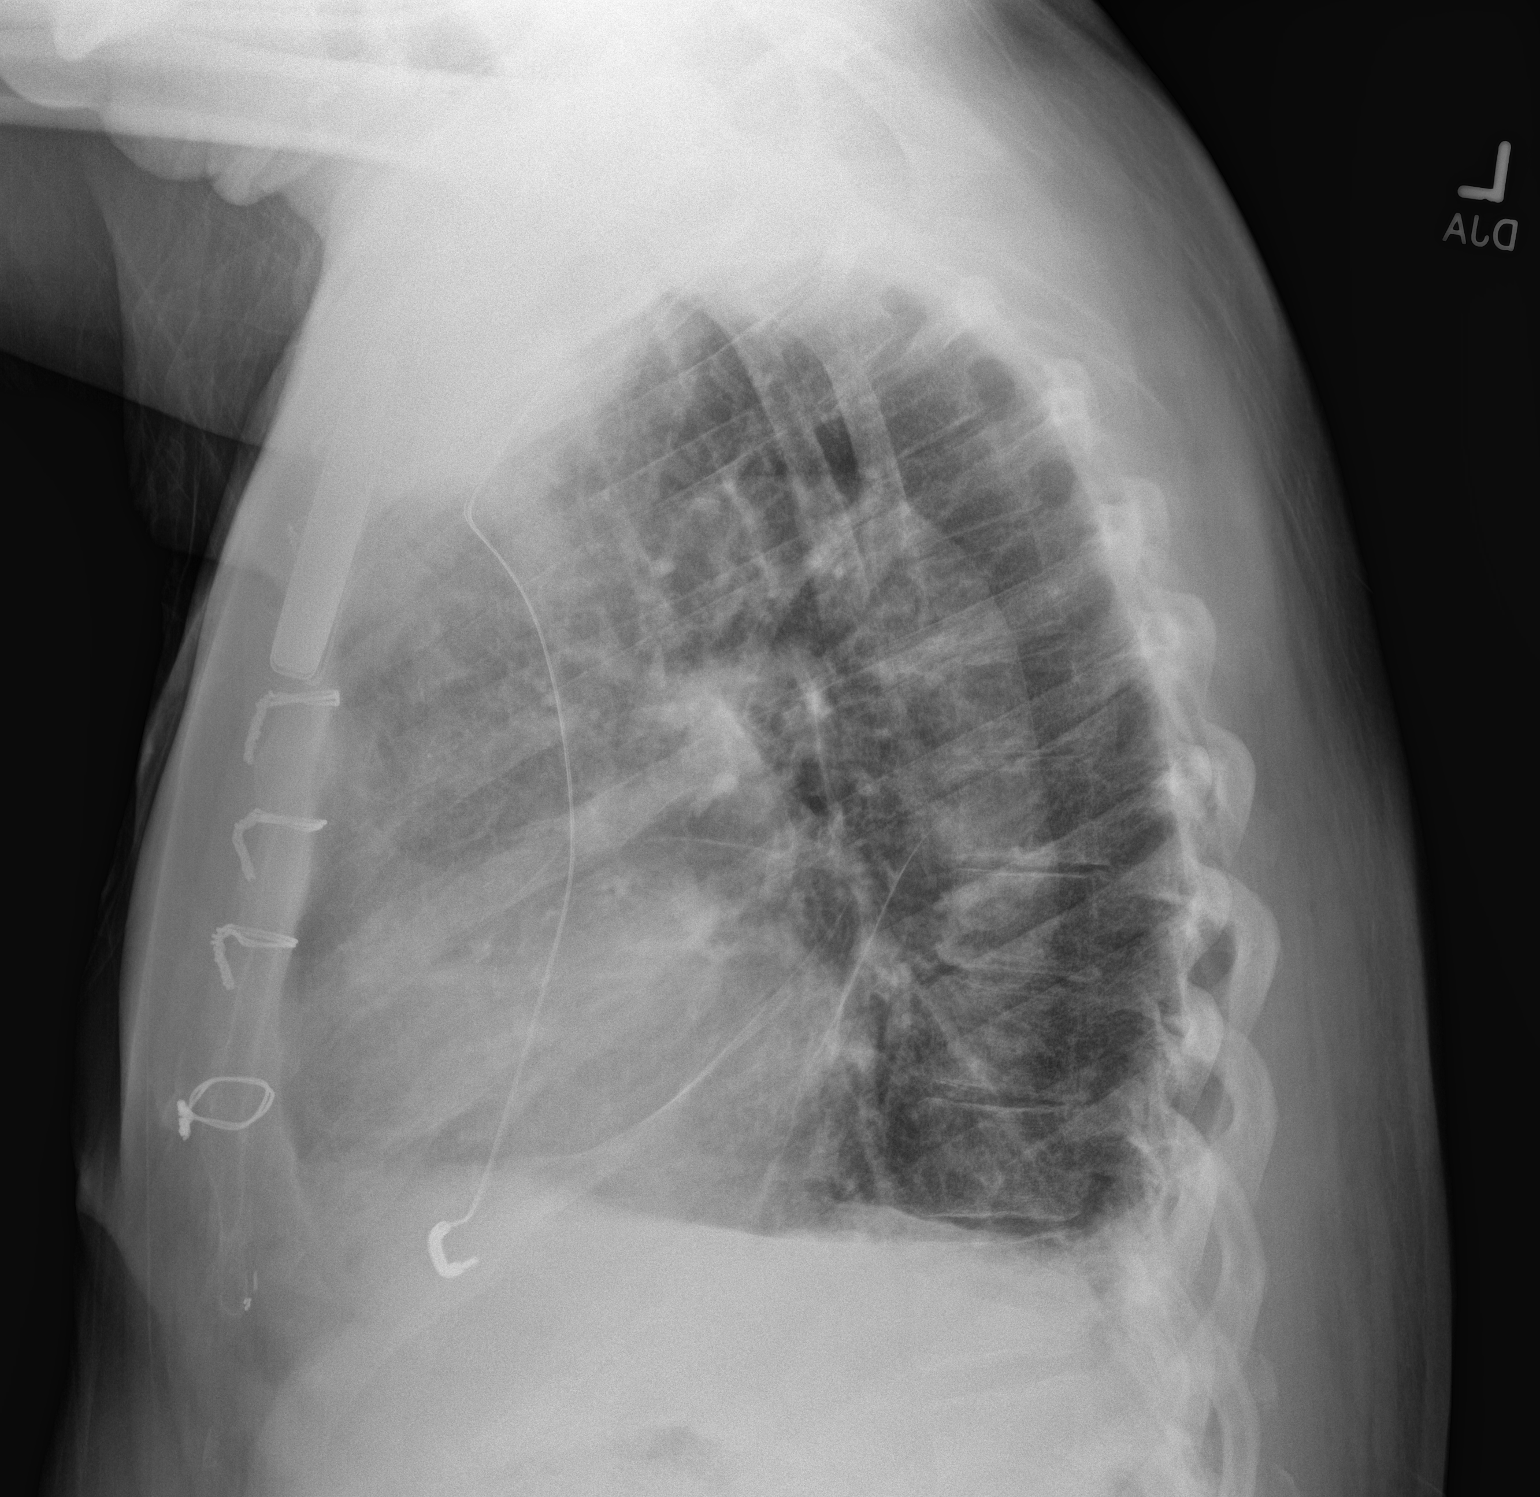

[2 of 2 positions shown; findings below may reference images not displayed]

FINDINGS: Mild cardiomegaly is stable. Left chest wall ICD lead is stable in
position. Median sternotomy wires appear intact and stable in
alignment.

Lungs are clear. No pleural effusion no pneumothorax seen. Stable
chronic blunting at the left costophrenic angle, presumed pleural
thickening.
IMPRESSION: No active cardiopulmonary disease. No evidence of pneumonia or
pulmonary edema.

Stable mild cardiomegaly.

## 2018-08-03 DIAGNOSIS — Z95810 Presence of automatic (implantable) cardiac defibrillator: Secondary | ICD-10-CM | POA: Diagnosis not present

## 2018-08-03 DIAGNOSIS — J449 Chronic obstructive pulmonary disease, unspecified: Secondary | ICD-10-CM | POA: Diagnosis not present

## 2018-08-03 DIAGNOSIS — E785 Hyperlipidemia, unspecified: Secondary | ICD-10-CM | POA: Diagnosis not present

## 2018-08-03 DIAGNOSIS — I4891 Unspecified atrial fibrillation: Secondary | ICD-10-CM | POA: Diagnosis not present

## 2018-08-03 DIAGNOSIS — I5020 Unspecified systolic (congestive) heart failure: Secondary | ICD-10-CM | POA: Diagnosis not present

## 2018-08-03 DIAGNOSIS — I469 Cardiac arrest, cause unspecified: Secondary | ICD-10-CM | POA: Diagnosis not present

## 2018-08-03 DIAGNOSIS — N183 Chronic kidney disease, stage 3 (moderate): Secondary | ICD-10-CM | POA: Diagnosis not present

## 2018-08-03 DIAGNOSIS — F1910 Other psychoactive substance abuse, uncomplicated: Secondary | ICD-10-CM | POA: Diagnosis not present

## 2018-08-03 DIAGNOSIS — K219 Gastro-esophageal reflux disease without esophagitis: Secondary | ICD-10-CM | POA: Diagnosis not present

## 2018-08-03 DIAGNOSIS — Z951 Presence of aortocoronary bypass graft: Secondary | ICD-10-CM | POA: Diagnosis not present

## 2018-08-03 DIAGNOSIS — I480 Paroxysmal atrial fibrillation: Secondary | ICD-10-CM | POA: Diagnosis not present

## 2018-08-11 DIAGNOSIS — I4891 Unspecified atrial fibrillation: Secondary | ICD-10-CM | POA: Diagnosis not present

## 2018-09-27 DIAGNOSIS — D229 Melanocytic nevi, unspecified: Secondary | ICD-10-CM | POA: Diagnosis not present

## 2018-09-27 DIAGNOSIS — L821 Other seborrheic keratosis: Secondary | ICD-10-CM | POA: Diagnosis not present

## 2018-09-27 DIAGNOSIS — B078 Other viral warts: Secondary | ICD-10-CM | POA: Diagnosis not present

## 2018-09-27 DIAGNOSIS — L820 Inflamed seborrheic keratosis: Secondary | ICD-10-CM | POA: Diagnosis not present

## 2019-03-10 DIAGNOSIS — I469 Cardiac arrest, cause unspecified: Secondary | ICD-10-CM | POA: Diagnosis not present

## 2019-03-10 DIAGNOSIS — I5020 Unspecified systolic (congestive) heart failure: Secondary | ICD-10-CM | POA: Diagnosis not present

## 2019-03-10 DIAGNOSIS — E785 Hyperlipidemia, unspecified: Secondary | ICD-10-CM | POA: Diagnosis not present

## 2019-03-10 DIAGNOSIS — K219 Gastro-esophageal reflux disease without esophagitis: Secondary | ICD-10-CM | POA: Diagnosis not present

## 2019-03-10 DIAGNOSIS — Z951 Presence of aortocoronary bypass graft: Secondary | ICD-10-CM | POA: Diagnosis not present

## 2019-03-10 DIAGNOSIS — I4891 Unspecified atrial fibrillation: Secondary | ICD-10-CM | POA: Diagnosis not present

## 2019-03-10 DIAGNOSIS — Z95810 Presence of automatic (implantable) cardiac defibrillator: Secondary | ICD-10-CM | POA: Diagnosis not present

## 2019-03-10 DIAGNOSIS — R0602 Shortness of breath: Secondary | ICD-10-CM | POA: Diagnosis not present

## 2019-03-10 DIAGNOSIS — N183 Chronic kidney disease, stage 3 (moderate): Secondary | ICD-10-CM | POA: Diagnosis not present

## 2019-04-26 DIAGNOSIS — I639 Cerebral infarction, unspecified: Secondary | ICD-10-CM | POA: Diagnosis not present

## 2019-04-26 DIAGNOSIS — N183 Chronic kidney disease, stage 3 (moderate): Secondary | ICD-10-CM | POA: Diagnosis not present

## 2019-04-26 DIAGNOSIS — I4891 Unspecified atrial fibrillation: Secondary | ICD-10-CM | POA: Diagnosis not present

## 2019-04-26 DIAGNOSIS — Z95810 Presence of automatic (implantable) cardiac defibrillator: Secondary | ICD-10-CM | POA: Diagnosis not present

## 2019-04-26 DIAGNOSIS — Z951 Presence of aortocoronary bypass graft: Secondary | ICD-10-CM | POA: Diagnosis not present

## 2019-04-26 DIAGNOSIS — I5020 Unspecified systolic (congestive) heart failure: Secondary | ICD-10-CM | POA: Diagnosis not present

## 2019-04-26 DIAGNOSIS — R0602 Shortness of breath: Secondary | ICD-10-CM | POA: Diagnosis not present

## 2019-04-26 DIAGNOSIS — I10 Essential (primary) hypertension: Secondary | ICD-10-CM | POA: Diagnosis not present

## 2019-04-26 DIAGNOSIS — K219 Gastro-esophageal reflux disease without esophagitis: Secondary | ICD-10-CM | POA: Diagnosis not present

## 2019-04-29 ENCOUNTER — Emergency Department: Payer: Medicare HMO

## 2019-04-29 ENCOUNTER — Inpatient Hospital Stay
Admission: EM | Admit: 2019-04-29 | Discharge: 2019-05-02 | DRG: 291 | Disposition: A | Discharge status: Home / Self Care | Payer: Medicare HMO | Attending: Internal Medicine | Admitting: Internal Medicine

## 2019-04-29 ENCOUNTER — Other Ambulatory Visit: Payer: Self-pay

## 2019-04-29 ENCOUNTER — Encounter: Payer: Self-pay | Admitting: Emergency Medicine

## 2019-04-29 DIAGNOSIS — E785 Hyperlipidemia, unspecified: Secondary | ICD-10-CM | POA: Diagnosis present

## 2019-04-29 DIAGNOSIS — T502X5A Adverse effect of carbonic-anhydrase inhibitors, benzothiadiazides and other diuretics, initial encounter: Secondary | ICD-10-CM | POA: Diagnosis present

## 2019-04-29 DIAGNOSIS — I509 Heart failure, unspecified: Secondary | ICD-10-CM | POA: Diagnosis not present

## 2019-04-29 DIAGNOSIS — I5043 Acute on chronic combined systolic (congestive) and diastolic (congestive) heart failure: Secondary | ICD-10-CM | POA: Diagnosis present

## 2019-04-29 DIAGNOSIS — I2510 Atherosclerotic heart disease of native coronary artery without angina pectoris: Secondary | ICD-10-CM | POA: Diagnosis not present

## 2019-04-29 DIAGNOSIS — J439 Emphysema, unspecified: Secondary | ICD-10-CM | POA: Diagnosis not present

## 2019-04-29 DIAGNOSIS — J9811 Atelectasis: Secondary | ICD-10-CM | POA: Diagnosis not present

## 2019-04-29 DIAGNOSIS — I429 Cardiomyopathy, unspecified: Secondary | ICD-10-CM | POA: Diagnosis not present

## 2019-04-29 DIAGNOSIS — R0600 Dyspnea, unspecified: Secondary | ICD-10-CM | POA: Diagnosis not present

## 2019-04-29 DIAGNOSIS — I480 Paroxysmal atrial fibrillation: Secondary | ICD-10-CM | POA: Diagnosis present

## 2019-04-29 DIAGNOSIS — Z95810 Presence of automatic (implantable) cardiac defibrillator: Secondary | ICD-10-CM

## 2019-04-29 DIAGNOSIS — Z8249 Family history of ischemic heart disease and other diseases of the circulatory system: Secondary | ICD-10-CM | POA: Diagnosis not present

## 2019-04-29 DIAGNOSIS — Z833 Family history of diabetes mellitus: Secondary | ICD-10-CM | POA: Diagnosis not present

## 2019-04-29 DIAGNOSIS — R0602 Shortness of breath: Secondary | ICD-10-CM

## 2019-04-29 DIAGNOSIS — J432 Centrilobular emphysema: Secondary | ICD-10-CM | POA: Diagnosis not present

## 2019-04-29 DIAGNOSIS — J441 Chronic obstructive pulmonary disease with (acute) exacerbation: Secondary | ICD-10-CM | POA: Diagnosis not present

## 2019-04-29 DIAGNOSIS — R7989 Other specified abnormal findings of blood chemistry: Secondary | ICD-10-CM | POA: Diagnosis not present

## 2019-04-29 DIAGNOSIS — N179 Acute kidney failure, unspecified: Secondary | ICD-10-CM | POA: Diagnosis present

## 2019-04-29 DIAGNOSIS — I130 Hypertensive heart and chronic kidney disease with heart failure and stage 1 through stage 4 chronic kidney disease, or unspecified chronic kidney disease: Principal | ICD-10-CM | POA: Diagnosis present

## 2019-04-29 DIAGNOSIS — N183 Chronic kidney disease, stage 3 (moderate): Secondary | ICD-10-CM | POA: Diagnosis present

## 2019-04-29 DIAGNOSIS — Z7982 Long term (current) use of aspirin: Secondary | ICD-10-CM | POA: Diagnosis not present

## 2019-04-29 DIAGNOSIS — Z8674 Personal history of sudden cardiac arrest: Secondary | ICD-10-CM | POA: Diagnosis not present

## 2019-04-29 DIAGNOSIS — Z7151 Drug abuse counseling and surveillance of drug abuser: Secondary | ICD-10-CM

## 2019-04-29 DIAGNOSIS — E876 Hypokalemia: Secondary | ICD-10-CM | POA: Diagnosis present

## 2019-04-29 DIAGNOSIS — I110 Hypertensive heart disease with heart failure: Secondary | ICD-10-CM | POA: Diagnosis not present

## 2019-04-29 DIAGNOSIS — N400 Enlarged prostate without lower urinary tract symptoms: Secondary | ICD-10-CM | POA: Diagnosis present

## 2019-04-29 DIAGNOSIS — I129 Hypertensive chronic kidney disease with stage 1 through stage 4 chronic kidney disease, or unspecified chronic kidney disease: Secondary | ICD-10-CM | POA: Diagnosis not present

## 2019-04-29 DIAGNOSIS — Z20828 Contact with and (suspected) exposure to other viral communicable diseases: Secondary | ICD-10-CM | POA: Diagnosis present

## 2019-04-29 DIAGNOSIS — R809 Proteinuria, unspecified: Secondary | ICD-10-CM | POA: Diagnosis not present

## 2019-04-29 DIAGNOSIS — R000 Tachycardia, unspecified: Secondary | ICD-10-CM | POA: Diagnosis present

## 2019-04-29 DIAGNOSIS — X58XXXA Exposure to other specified factors, initial encounter: Secondary | ICD-10-CM | POA: Diagnosis present

## 2019-04-29 DIAGNOSIS — Z951 Presence of aortocoronary bypass graft: Secondary | ICD-10-CM

## 2019-04-29 DIAGNOSIS — F1210 Cannabis abuse, uncomplicated: Secondary | ICD-10-CM | POA: Diagnosis present

## 2019-04-29 DIAGNOSIS — N2889 Other specified disorders of kidney and ureter: Secondary | ICD-10-CM | POA: Diagnosis not present

## 2019-04-29 DIAGNOSIS — M19032 Primary osteoarthritis, left wrist: Secondary | ICD-10-CM | POA: Diagnosis present

## 2019-04-29 DIAGNOSIS — R778 Other specified abnormalities of plasma proteins: Secondary | ICD-10-CM

## 2019-04-29 DIAGNOSIS — Z79891 Long term (current) use of opiate analgesic: Secondary | ICD-10-CM

## 2019-04-29 DIAGNOSIS — Z888 Allergy status to other drugs, medicaments and biological substances status: Secondary | ICD-10-CM

## 2019-04-29 DIAGNOSIS — Z79890 Hormone replacement therapy (postmenopausal): Secondary | ICD-10-CM

## 2019-04-29 DIAGNOSIS — I501 Left ventricular failure: Secondary | ICD-10-CM | POA: Diagnosis not present

## 2019-04-29 DIAGNOSIS — Z87891 Personal history of nicotine dependence: Secondary | ICD-10-CM

## 2019-04-29 DIAGNOSIS — Z79899 Other long term (current) drug therapy: Secondary | ICD-10-CM

## 2019-04-29 DIAGNOSIS — Z83511 Family history of glaucoma: Secondary | ICD-10-CM | POA: Diagnosis not present

## 2019-04-29 DIAGNOSIS — I5021 Acute systolic (congestive) heart failure: Secondary | ICD-10-CM | POA: Diagnosis not present

## 2019-04-29 DIAGNOSIS — I10 Essential (primary) hypertension: Secondary | ICD-10-CM

## 2019-04-29 DIAGNOSIS — I248 Other forms of acute ischemic heart disease: Secondary | ICD-10-CM | POA: Diagnosis present

## 2019-04-29 LAB — BASIC METABOLIC PANEL
Anion gap: 14 (ref 5–15)
BUN: 44 mg/dL — ABNORMAL HIGH (ref 8–23)
CO2: 22 mmol/L (ref 22–32)
Calcium: 9 mg/dL (ref 8.9–10.3)
Chloride: 100 mmol/L (ref 98–111)
Creatinine, Ser: 2.68 mg/dL — ABNORMAL HIGH (ref 0.61–1.24)
GFR calc Af Amer: 27 mL/min — ABNORMAL LOW (ref >60)
GFR calc non Af Amer: 23 mL/min — ABNORMAL LOW (ref >60)
Glucose, Bld: 103 mg/dL — ABNORMAL HIGH (ref 70–99)
Potassium: 3.2 mmol/L — ABNORMAL LOW (ref 3.5–5.1)
Sodium: 136 mmol/L (ref 135–145)

## 2019-04-29 LAB — CBC
HCT: 51.8 % (ref 39.0–52.0)
Hemoglobin: 17.6 g/dL — ABNORMAL HIGH (ref 13.0–17.0)
MCH: 30.8 pg (ref 26.0–34.0)
MCHC: 34 g/dL (ref 30.0–36.0)
MCV: 90.6 fL (ref 80.0–100.0)
Platelets: 132 10*3/uL — ABNORMAL LOW (ref 150–400)
RBC: 5.72 MIL/uL (ref 4.22–5.81)
RDW: 13.4 % (ref 11.5–15.5)
WBC: 8 10*3/uL (ref 4.0–10.5)
nRBC: 0 % (ref 0.0–0.2)

## 2019-04-29 LAB — APTT: aPTT: 33 seconds (ref 24–36)

## 2019-04-29 LAB — PROTIME-INR
INR: 1.3 — ABNORMAL HIGH (ref 0.8–1.2)
Prothrombin Time: 16.4 seconds — ABNORMAL HIGH (ref 11.4–15.2)

## 2019-04-29 LAB — TROPONIN I
Troponin I: 0.15 ng/mL (ref <0.03)
Troponin I: 7.69 ng/mL (ref <0.03)

## 2019-04-29 LAB — SARS CORONAVIRUS 2 BY RT PCR (HOSPITAL ORDER, PERFORMED IN ~~LOC~~ HOSPITAL LAB): SARS Coronavirus 2: NEGATIVE

## 2019-04-29 LAB — BRAIN NATRIURETIC PEPTIDE: B Natriuretic Peptide: 457 pg/mL — ABNORMAL HIGH (ref 0.0–100.0)

## 2019-04-29 MED ORDER — FUROSEMIDE 10 MG/ML IJ SOLN
40.0000 mg | Freq: Once | INTRAMUSCULAR | Status: AC
  Administered 2019-04-29: 16:00:00 40 mg via INTRAVENOUS
  Filled 2019-04-29 (×2): qty 4

## 2019-04-29 MED ORDER — HEPARIN BOLUS VIA INFUSION
4000.0000 [IU] | Freq: Once | INTRAVENOUS | Status: AC
  Administered 2019-04-30: 4000 [IU] via INTRAVENOUS
  Filled 2019-04-29: qty 4000

## 2019-04-29 MED ORDER — FUROSEMIDE 10 MG/ML IJ SOLN: 40.0000 mg | Freq: Once | INTRAMUSCULAR | Status: DC

## 2019-04-29 MED ORDER — FUROSEMIDE 10 MG/ML IJ SOLN: 80.0000 mg | Freq: Once | INTRAMUSCULAR | Status: DC

## 2019-04-29 MED ORDER — SODIUM CHLORIDE 0.9 % IV SOLN: 250.0000 mL | INTRAVENOUS | Status: DC | PRN

## 2019-04-29 MED ORDER — HEPARIN (PORCINE) 25000 UT/250ML-% IV SOLN
950.0000 [IU]/h | INTRAVENOUS | Status: DC
  Administered 2019-04-30: 950 [IU]/h via INTRAVENOUS
  Filled 2019-04-29: qty 250

## 2019-04-29 MED ORDER — SODIUM CHLORIDE 0.9% FLUSH: 3.0000 mL | Freq: Once | INTRAVENOUS | Status: DC

## 2019-04-29 MED ORDER — ASPIRIN EC 81 MG PO TBEC
81.0000 mg | DELAYED_RELEASE_TABLET | Freq: Every day | ORAL | Status: DC
  Administered 2019-04-30 – 2019-05-02 (×3): 81 mg via ORAL
  Filled 2019-04-29 (×3): qty 1

## 2019-04-29 MED ORDER — AMIODARONE HCL 200 MG PO TABS
200.0000 mg | ORAL_TABLET | Freq: Two times a day (BID) | ORAL | Status: DC
  Administered 2019-04-30 – 2019-05-02 (×5): 200 mg via ORAL
  Filled 2019-04-29 (×5): qty 1

## 2019-04-29 MED ORDER — ENOXAPARIN SODIUM 30 MG/0.3ML ~~LOC~~ SOLN: 30.0000 mg | SUBCUTANEOUS | Status: DC

## 2019-04-29 MED ORDER — FUROSEMIDE 10 MG/ML IJ SOLN
40.0000 mg | Freq: Two times a day (BID) | INTRAMUSCULAR | Status: DC
  Administered 2019-04-30 – 2019-05-01 (×3): 40 mg via INTRAVENOUS
  Filled 2019-04-29 (×3): qty 4

## 2019-04-29 MED ORDER — ONDANSETRON HCL 4 MG/2ML IJ SOLN: 4.0000 mg | Freq: Four times a day (QID) | INTRAMUSCULAR | Status: DC | PRN

## 2019-04-29 MED ORDER — SODIUM CHLORIDE 0.9% FLUSH: 3.0000 mL | INTRAVENOUS | Status: DC | PRN

## 2019-04-29 MED ORDER — TRAMADOL HCL 50 MG PO TABS: 100.0000 mg | ORAL_TABLET | Freq: Two times a day (BID) | ORAL | Status: DC | PRN

## 2019-04-29 MED ORDER — ACETAMINOPHEN 325 MG PO TABS: 650.0000 mg | ORAL_TABLET | ORAL | Status: DC | PRN

## 2019-04-29 MED ORDER — SODIUM CHLORIDE 0.9% FLUSH
3.0000 mL | Freq: Two times a day (BID) | INTRAVENOUS | Status: DC
  Administered 2019-04-30 – 2019-05-02 (×6): 3 mL via INTRAVENOUS

## 2019-04-29 MED ORDER — POTASSIUM CHLORIDE CRYS ER 20 MEQ PO TBCR
40.0000 meq | EXTENDED_RELEASE_TABLET | Freq: Once | ORAL | Status: AC
  Administered 2019-04-30: 40 meq via ORAL
  Filled 2019-04-29: qty 2

## 2019-04-29 NOTE — Consult Note (Signed)
ANTICOAGULATION CONSULT NOTE - Initial Consult  Pharmacy Consult for Heparin Dosing and Monitoring  Indication: chest pain/ACS  Allergies  Allergen Reactions  . Spironolactone Other (See Comments)    Hyperkalemia  . Entresto [Sacubitril-Valsartan] Diarrhea  . Ziac [Bisoprolol-Hydrochlorothiazide]    Patient Measurements: Height: 5\' 10"  (177.8 cm) Weight: 184 lb 11.2 oz (83.8 kg) IBW/kg (Calculated) : 73  Vital Signs: Temp: 97.9 F (36.6 C) (05/29 2201) Temp Source: Oral (05/29 2201) BP: 121/95 (05/29 2201) Pulse Rate: 93 (05/29 2201)  Labs: Recent Labs    04/29/19 1451 04/29/19 2205  HGB 17.6*  --   HCT 51.8  --   PLT 132*  --   CREATININE 2.68*  --   TROPONINI 0.15* 7.69*    Estimated Creatinine Clearance: 27.2 mL/min (A) (by C-G formula based on SCr of 2.68 mg/dL (H)).   Medical History: Past Medical History:  Diagnosis Date  . AICD (automatic cardioverter/defibrillator) present   . BPH (benign prostatic hyperplasia)   . CAD (coronary artery disease) 05/14/2006  . Cardiac arrest ([place]) 11/12/2013   Overview:  December 2014: ICD implanted  Last Assessment & Plan:  Relevant Hx: Course: Daily Update: Today's Plan:   . Chronic systolic CHF (congestive heart failure) ([place])   . Erectile dysfunction   . H/O coronary artery bypass surgery 05/17/2015   Overview:  LIMA to LAD   . HLD (hyperlipidemia)   . Osteoarthritis of left wrist   . Paroxysmal atrial fibrillation [facility])     Assessment: Pharmacy consulted for heparin infusion dosing and monitoring for ACS/STEMI.  5/29 @ 2205 Troponin I  7.69. No anticoagulants reported PTA.   CrCl: 27 mL/min  Goal of Therapy:  Heparin level 0.3-0.7 units/ml Monitor platelets by anticoagulation protocol: Yes   Plan:  Baseline labs ordered.  Give 4000 units bolus x 1 Start heparin infusion at 950 units/hr Check anti-Xa level in 8 hours and daily while on heparin Continue to monitor H&H and platelets  Pernell Dupre,  PharmD, BCPS Clinical Pharmacist 04/29/2019 11:34 PM

## 2019-04-29 NOTE — ED Notes (Signed)
ED TO INPATIENT HANDOFF REPORT  ED Nurse Name and Phone #: Katharina Caper  S Name/Age/Gender Chris Nguyen 68 y.o. male Room/Bed: ED33A/ED33A  Code Status   Code Status: Prior  Home/SNF/Other Home Patient oriented to: self, place, time and situation Is this baseline? Yes   Triage Complete: Triage complete  Chief Complaint Edema   Triage Note Pt via pov from home with edema x 1 week. Pt states "my fluid pills aren't working." Pt states he had not had his normal urination after taking diuretic, and he feels very short of breath; has swollen ankles. Pt has hx of MI & CHF; called cardiologist, who told him to come here. Pt alert & oriented, nad noted.    Allergies Allergies  Allergen Reactions  . Spironolactone Other (See Comments)    Hyperkalemia  . Entresto [Sacubitril-Valsartan] Diarrhea  . Ziac [Bisoprolol-Hydrochlorothiazide]     Level of Care/Admitting Diagnosis ED Disposition    ED Disposition Condition Comment   Admit  The patient appears reasonably stabilized for admission considering the current resources, flow, and capabilities available in the ED at this time, and I doubt any other [facility] requiring further screening and/or treatment in the ED prior to admission is  present.       B Medical/Surgery History Past Medical History:  Diagnosis Date  . AICD (automatic cardioverter/defibrillator) present   . BPH (benign prostatic hyperplasia)   . CAD (coronary artery disease) 05/14/2006  . Cardiac arrest ([place]) 11/12/2013   Overview:  December 2014: ICD implanted  Last Assessment & Plan:  Relevant Hx: Course: Daily Update: Today's Plan:   . Chronic systolic CHF (congestive heart failure) ([place])   . Erectile dysfunction   . H/O coronary artery bypass surgery 05/17/2015   Overview:  LIMA to LAD   . HLD (hyperlipidemia)   . Osteoarthritis of left wrist   . Paroxysmal atrial fibrillation [facility])    Past Surgical History:  Procedure Laterality Date  . CARDIAC  CATHETERIZATION  06/27/2008  . CARPAL TUNNEL RELEASE    . CORONARY ARTERY BYPASS GRAFT  2006 and 2014   LIMA to LAD at [facility] 2014  . MITRAL VALVE REPAIR       A IV Location/Drains/Wounds Patient Lines/Drains/Airways Status   Active Line/Drains/Airways    Name:   Placement date:   Placement time:   Site:   Days:   Peripheral IV 04/29/19 Right Arm   04/29/19    1558    Arm   less than 1          Intake/Output Last 24 hours No intake or output data in the 24 hours ending 04/29/19 1813  Labs/Imaging Results for orders placed or performed during the hospital encounter of 04/29/19 (from the past 48 hour(s))  Basic metabolic panel     Status: Abnormal   Collection Time: 04/29/19  2:51 PM  Result Value Ref Range   Sodium 136 135 - 145 mmol/L   Potassium 3.2 (L) 3.5 - 5.1 mmol/L   Chloride 100 98 - 111 mmol/L   CO2 22 22 - 32 mmol/L   Glucose, Bld 103 (H) 70 - 99 mg/dL   BUN 44 (H) 8 - 23 mg/dL   Creatinine, Ser 2.68 (H) 0.61 - 1.24 mg/dL   Calcium 9.0 8.9 - 10.3 mg/dL   GFR calc non Af Amer 23 (L) >60 mL/min   GFR calc Af Amer 27 (L) >60 mL/min   Anion gap 14 5 - 15    Comment: Performed at  [facility], [address] [place], [place] [address]  CBC     Status: Abnormal   Collection Time: 04/29/19  2:51 PM  Result Value Ref Range   WBC 8.0 4.0 - 10.5 K/uL   RBC 5.72 4.22 - 5.81 MIL/uL   Hemoglobin 17.6 (H) 13.0 - 17.0 g/dL   HCT 51.8 39.0 - 52.0 %   MCV 90.6 80.0 - 100.0 fL   MCH 30.8 26.0 - 34.0 pg   MCHC 34.0 30.0 - 36.0 g/dL   RDW 13.4 11.5 - 15.5 %   Platelets 132 (L) 150 - 400 K/uL   nRBC 0.0 0.0 - 0.2 %    Comment: Performed at [facility], [address] [place], [place] [address]  Troponin I - Add-On to previous collection     Status: Abnormal   Collection Time: 04/29/19  2:51 PM  Result Value Ref Range   Troponin I 0.15 (HH) <0.03 ng/mL    Comment: CRITICAL RESULT CALLED TO, READ BACK BY AND VERIFIED WITH JENNIFER WHITLEY AT 6811  04/29/2019.PMF Performed at [facility], [address] [place], [place] [address]   Brain natriuretic peptide     Status: Abnormal   Collection Time: 04/29/19  2:51 PM  Result Value Ref Range   B Natriuretic Peptide 457.0 (H) 0.0 - 100.0 pg/mL    Comment: Performed at [facility], [address] [place], [place] [address]  SARS Coronavirus 2 (CEPHEID - Performed in [place] hospital lab), Hosp Order     Status: None   Collection Time: 04/29/19  4:00 PM  Result Value Ref Range   SARS Coronavirus 2 NEGATIVE NEGATIVE    Comment: (NOTE) If result is NEGATIVE SARS-CoV-2 target nucleic acids are NOT DETECTED. The SARS-CoV-2 RNA is generally detectable in upper and lower  respiratory specimens during the acute phase of infection. The lowest  concentration of SARS-CoV-2 viral copies this assay can detect is 250  copies / mL. A negative result does not preclude SARS-CoV-2 infection  and should not be used as the sole basis for treatment or other  patient management decisions.  A negative result may occur with  improper specimen collection / handling, submission of specimen other  than nasopharyngeal swab, presence of viral mutation(s) within the  areas targeted by this assay, and inadequate number of viral copies  (<250 copies / mL). A negative result must be combined with clinical  observations, patient history, and epidemiological information. If result is POSITIVE SARS-CoV-2 target nucleic acids are DETECTED. The SARS-CoV-2 RNA is generally detectable in upper and lower  respiratory specimens dur ing the acute phase of infection.  Positive  results are indicative of active infection with SARS-CoV-2.  Clinical  correlation with patient history and other diagnostic information is  necessary to determine patient infection status.  Positive results do  not rule out bacterial infection or co-infection with other viruses. If result is PRESUMPTIVE POSTIVE SARS-CoV-2  nucleic acids MAY BE PRESENT.   A presumptive positive result was obtained on the submitted specimen  and confirmed on repeat testing.  While 2019 novel coronavirus  (SARS-CoV-2) nucleic acids may be present in the submitted sample  additional confirmatory testing may be necessary for epidemiological  and / or clinical management purposes  to differentiate between  SARS-CoV-2 and other Sarbecovirus currently known to infect humans.  If clinically indicated additional testing with an alternate test  methodology [phone]) is advised. The SARS-CoV-2 RNA is generally  detectable in upper  and lower respiratory sp ecimens during the acute  phase of infection. The expected result is Negative. Fact Sheet for Patients:  StrictlyIdeas.no Fact Sheet for Healthcare Providers: BankingDealers.co.za This test is not yet approved or cleared by the [place] FDA and has been authorized for detection and/or diagnosis of SARS-CoV-2 by FDA under an Emergency Use Authorization (EUA).  This EUA will remain in effect (meaning this test can be used) for the duration of the COVID-19 declaration under Section 564(b)(1) of the Act, 21 U.S.C. section 360bbb-3(b)(1), unless the authorization is terminated or revoked sooner. Performed at [facility], [address] [place], [place] [address]    Dg Chest 1 View  Result Date: 04/29/2019 CLINICAL DATA:  Shortness of breath. Lower extremity swelling. EXAM: CHEST  1 VIEW COMPARISON:  01/28/2018 FINDINGS: Prior CABG, an ICD, cardiomegaly, and aortic atherosclerosis are again noted. There is central pulmonary vascular congestion without overt edema. No airspace consolidation, sizeable pleural effusion, pneumothorax is identified. No acute osseous abnormality is seen. IMPRESSION: Cardiomegaly and pulmonary vascular congestion. Electronically Signed   By: Logan Bores M.D.   On: 04/29/2019 15:39    Pending  Labs Unresulted Labs (From admission, onward)   None      Vitals/Pain Today's Vitals   04/29/19 1445 04/29/19 1717  BP: 114/81 (!) 121/95  Pulse: (!) 112 (!) 113  Resp: (!) 22 (!) 22  Temp: 98.5 F (36.9 C)   TempSrc: Oral   SpO2: 97% 96%  Weight: 81.6 kg   Height: 5\' 10"  (1.778 m)   PainSc: 0-No pain     Isolation Precautions No active isolations  Medications Medications  furosemide (LASIX) injection 40 mg (40 mg Intravenous Given 04/29/19 1559)    Mobility non-ambulatory Low fall risk   Focused Assessments Cardiac Assessment Handoff:    Lab Results  Component Value Date   CKTOTAL 535 (H) 11/04/2013   CKMB 8.6 (H) 11/04/2013   TROPONINI 0.15 ([place]) 04/29/2019   No results found for: DDIMER Does the Patient currently have chest pain? No     R Recommendations: See Admitting Provider Note  Report given to:   Additional Notes:

## 2019-04-29 NOTE — Progress Notes (Signed)
Notified MD of TIS of 7.69. Orders placed. Will continue to monitor and assess.

## 2019-04-29 NOTE — Progress Notes (Signed)
Family Meeting Note  Advance Directive:no  Today a meeting took place with the Patient.  Patient is able to participate.  The following clinical team members were present during this meeting:MD  The following were discussed:Patient's diagnosis: acute CHF exacerbation, Patient's progosis: Unable to determine and Goals for treatment: Full Code  Additional follow-up to be provided: prn  Time spent during discussion:20 minutes  Evette Doffing, MD

## 2019-04-29 NOTE — ED Notes (Signed)
Date and time results received: 04/29/19 4:17 PM   Test: Troponon Critical Value: 0.15  Name of Provider Notified: Corky Downs, MD

## 2019-04-29 NOTE — H&P (Signed)
[place] [facility] [place] NAME: Chris Nguyen    MR#:  [id number]  DATE OF BIRTH:  November 19, 1951  DATE OF ADMISSION:  04/29/2019  PRIMARY CARE PHYSICIAN: Birdie Sons, MD   REQUESTING/REFERRING PHYSICIAN: Lavonia Drafts, MD  CHIEF COMPLAINT:   Chief Complaint  Patient presents with  . Shortness of Breath  . Leg Swelling    HISTORY OF PRESENT ILLNESS:  Chris Nguyen  is a 68 y.o. male with a known history of chronic systolic CHF with AICD in place, CAD s/p CABG, hyperlipidemia, paroxysmal atrial fibrillation who presented to the ED with progressively worsening shortness of breath over the last 2 weeks.  He also endorses worsening lower extremity edema and orthopnea.  Patient has been taking torsemide as prescribed at home, but states it has not been making him pee like he usually does.  He denies any chest pain or abdominal pain.  In the ED, he was tachycardic to 113 and tachypneic with respiratory rates to 22.  Labs were significant for potassium 3.2, creatinine 2.68, BNP 457, troponin 0.15.  Chest x-ray with cardiomegaly and pulmonary vascular congestion.  He was given a dose of IV Lasix and hospitalists were called for admission.  PAST MEDICAL HISTORY:   Past Medical History:  Diagnosis Date  . AICD (automatic cardioverter/defibrillator) present   . BPH (benign prostatic hyperplasia)   . CAD (coronary artery disease) 05/14/2006  . Cardiac arrest ([place]) 11/12/2013   Overview:  December 2014: ICD implanted  Last Assessment & Plan:  Relevant Hx: Course: Daily Update: Today's Plan:   . Chronic systolic CHF (congestive heart failure) ([place])   . Erectile dysfunction   . H/O coronary artery bypass surgery 05/17/2015   Overview:  LIMA to LAD   . HLD (hyperlipidemia)   . Osteoarthritis of left wrist   . Paroxysmal atrial fibrillation ([place])     PAST SURGICAL HISTORY:   Past Surgical History:  Procedure Laterality Date  . CARDIAC CATHETERIZATION   06/27/2008  . CARPAL TUNNEL RELEASE    . CORONARY ARTERY BYPASS GRAFT  2006 and 2014   LIMA [facility] LAD at [facility] 2014  . MITRAL VALVE REPAIR      SOCIAL HISTORY:   Social History   Tobacco Use  . Smoking status: Former Smoker    Last attempt to quit: 12/01/2004    Years since quitting: 14.4  . Smokeless tobacco: Never Used  Substance Use Topics  . Alcohol use: Yes    Alcohol/week: 7.0 - 9.0 standard drinks    Types: 1 Glasses of wine, 6 - 8 Cans of beer per week    FAMILY HISTORY:   Family History  Problem Relation Age of Onset  . CAD Mother 26  . Diabetes Mother        Diabetes Mellitus  . Dementia Father   . Glaucoma Father   . Heart disease Other     DRUG ALLERGIES:   Allergies  Allergen Reactions  . Spironolactone Other (See Comments)    Hyperkalemia  . Entresto [Sacubitril-Valsartan] Diarrhea  . Ziac [Bisoprolol-Hydrochlorothiazide]     REVIEW OF SYSTEMS:   Review of Systems  Constitutional: Negative for chills and fever.  HENT: Negative for congestion and sore throat.   Eyes: Negative for blurred vision and double vision.  Respiratory: Positive for shortness of breath. Negative for cough.   Cardiovascular: Positive for orthopnea and leg swelling. Negative for chest pain and palpitations.  Gastrointestinal: Negative for nausea and  vomiting.  Genitourinary: Negative for dysuria and urgency.  Musculoskeletal: Negative for back pain and neck pain.  Neurological: Negative for dizziness and headaches.  Psychiatric/Behavioral: Negative for depression. The patient is not nervous/anxious.     MEDICATIONS AT HOME:   Prior to Admission medications   Medication Sig Start Date End Date Taking? Authorizing Provider  amiodarone (PACERONE) 200 MG tablet Take 200 mg by mouth See admin instructions. Take 1 tablet (200mg ) by mouth twice daily for 2 weeks then take 1 tablet (200mg ) by mouth daily 04/26/19  Yes [provider]  aspirin 81 MG EC tablet Take 81 mg by  mouth daily.    Yes [provider]  lisinopril (PRINIVIL,ZESTRIL) 20 MG tablet TAKE 1 TABLET EVERY DAY Patient taking differently: Take 20 mg by mouth daily.  05/13/18  Yes Birdie Sons, MD  torsemide (DEMADEX) 20 MG tablet Take 40 mg by mouth 2 (two) times daily. 03/23/19  Yes [provider]  traMADol (ULTRAM) 50 MG tablet Take 100 mg by mouth every 12 (twelve) hours as needed for moderate pain.   Yes [provider]  atorvastatin (LIPITOR) 80 MG tablet TAKE 1 TABLET EVERY DAY Patient not taking: Reported on 04/29/2019 02/05/16   Birdie Sons, MD  furosemide (LASIX) 40 MG tablet Take 1 tablet (40 mg total) by mouth 2 (two) times daily. Patient not taking: Reported on 04/29/2019 01/31/18   Epifanio Lesches, MD  levothyroxine (SYNTHROID, LEVOTHROID) 25 MCG tablet Take 1 tablet by month in the mornings and 1 tablet by mouth in the evenings. Patient not taking: Reported on 04/29/2019 02/24/18   Birdie Sons, MD  metoprolol succinate (TOPROL-XL) 50 MG 24 hr tablet Take 1 tablet (50 mg total) by mouth daily. Patient not taking: Reported on 04/29/2019 12/22/16   Birdie Sons, MD      VITAL SIGNS:  Blood pressure (!) 121/95, pulse (!) 113, temperature 98.5 F (36.9 C), temperature source Oral, resp. rate (!) 22, height 5\' 10"  (1.778 m), weight 81.6 kg, SpO2 96 %.  PHYSICAL EXAMINATION:  Physical Exam  GENERAL:  68 y.o.-year-old patient lying in the bed with no acute distress.  EYES: Pupils equal, round, reactive to light and accommodation. No scleral icterus. Extraocular muscles intact.  HEENT: Head atraumatic, normocephalic. Oropharynx and nasopharynx clear.  NECK:  Supple, no jugular venous distention. No thyroid enlargement, no tenderness.  LUNGS: + Bibasilar crackles present. No use of accessory muscles of respiration.  CARDIOVASCULAR: Tachycardic, regular rate, S1, S2 normal. No murmurs, rubs, or gallops.  ABDOMEN: Soft, nontender, nondistended. Bowel  sounds present. No organomegaly or mass.  EXTREMITIES: No cyanosis or clubbing. 2+ pitting edema to the mid-shin bilaterally NEUROLOGIC: Cranial nerves II through XII are intact. Muscle strength 5/5 in all extremities. Sensation intact. Gait not checked.  PSYCHIATRIC: The patient is alert and oriented x 3.  SKIN: No obvious rash, lesion, or ulcer.   LABORATORY PANEL:   CBC Recent Labs  Lab 04/29/19 1451  WBC 8.0  HGB 17.6*  HCT 51.8  PLT 132*   ------------------------------------------------------------------------------------------------------------------  Chemistries  Recent Labs  Lab 04/29/19 1451  NA 136  K 3.2*  CL 100  CO2 22  GLUCOSE 103*  BUN 44*  CREATININE 2.68*  CALCIUM 9.0   ------------------------------------------------------------------------------------------------------------------  Cardiac Enzymes Recent Labs  Lab 04/29/19 1451  TROPONINI 0.15*   ------------------------------------------------------------------------------------------------------------------  RADIOLOGY:  Dg Chest 1 View  Result Date: 04/29/2019 CLINICAL DATA:  Shortness of breath. Lower extremity swelling. EXAM: CHEST  1 VIEW COMPARISON:  01/28/2018 FINDINGS: Prior CABG, an ICD, cardiomegaly, and aortic atherosclerosis are again noted. There is central pulmonary vascular congestion without overt edema. No airspace consolidation, sizeable pleural effusion, pneumothorax is identified. No acute osseous abnormality is seen. IMPRESSION: Cardiomegaly and pulmonary vascular congestion. Electronically Signed   By: Logan Bores M.D.   On: 04/29/2019 15:39      IMPRESSION AND PLAN:   Acute on chronic combined systolic and diastolic congestive heart failure with AICD in place- last ECHO in 2019 with EF 20-25% and G1DD.  Follows with Dr. Clayborn Bigness as an outpatient. -Continue IV Lasix -ECHO -[facility] consult -Strict I/O, daily weights -Cardiac monitoring  AKI- likely due to  cardiorenal syndrome.  Creatinine 2.68, baseline 1.4-1.6. -Holding home lisinopril -Consider nephrology consult in the morning if no improvement -Check renal ultrasound  Elevated troponin with a history of CAD s/p CABG- likely secondary to demand ischemia in the setting of CHF exacerbation and AKI.  No active chest pain. -Trend troponins  Paroxysmal atrial fibrillation- mildly tachycardic here -Not on anticoagulation due to history of muscle hematoma -Continue home amiodarone  Hypokalemia-likely due to diuretics -Replete and recheck   All the records are reviewed and case discussed with ED provider. Management plans discussed with the patient, family and they are in agreement.  CODE STATUS: Full  TOTAL TIME TAKING CARE OF THIS PATIENT: 45 minutes.    Berna Spare Sanna Porcaro M.D on 04/29/2019 at 7:04 PM  Between 7am to 6pm - Pager [phone]  After 6pm go to [email or web address] - Proofreader  [facility]  Office  [phone]  CC: Primary care physician; Birdie Sons, MD   Note: This dictation was prepared with Dragon dictation along with smaller phrase technology. Any transcriptional errors that result from this process are unintentional.

## 2019-04-29 NOTE — ED Notes (Signed)
Admitting MD at bedside.

## 2019-04-29 NOTE — ED Provider Notes (Signed)
[facility] Emergency Department Provider Note   ____________________________________________    I have reviewed the triage vital signs and the nursing notes.   HISTORY  Chief Complaint Shortness of Breath and Leg Swelling     HPI Chris Nguyen is a 68 y.o. male who presents with complaints of shortness of breath and leg swelling which is been worsening over the last several days.  Patient reports a history of CHF and this feels similar.  He reports he cannot breathe when he lies flat and gets very short of breath with any exertion.  He has been having to sleep upright, he reports compliance with his torsemide.  Denies fevers or chills.  No recent travel.  No calf pain or pleurisy.  No exposure to COVID positive patients.  Past Medical History:  Diagnosis Date  . AICD (automatic cardioverter/defibrillator) present   . BPH (benign prostatic hyperplasia)   . CAD (coronary artery disease) 05/14/2006  . Cardiac arrest ([place]) 11/12/2013   Overview:  December 2014: ICD implanted  Last Assessment & Plan:  Relevant Hx: Course: Daily Update: Today's Plan:   . Chronic systolic CHF (congestive heart failure) ([place])   . Erectile dysfunction   . H/O coronary artery bypass surgery 05/17/2015   Overview:  LIMA to LAD   . HLD (hyperlipidemia)   . Osteoarthritis of left wrist   . Paroxysmal atrial fibrillation [facility])     Patient Active Problem List   Diagnosis Date Noted  . CHF (congestive heart failure) ([place]) 01/23/2018  . Hypothyroid 01/19/2018  . Allergic rhinitis 11/05/2017  . HLD (hyperlipidemia) 02/25/2016  . Acute on chronic systolic CHF (congestive heart failure) ([place]) 02/24/2016  . Chronic kidney disease (CKD), stage III (moderate) ([place]) 05/17/2015  . Acid reflux 05/17/2015  . Heart failure, systolic ([place]) 16/09/9603  . Hyperlipidemia   . Hypertension   . Hypertensive cardiomyopathy ([place])   . Cardiomyopathy ([place])   . Foot pain, left   . BPH (benign  prostatic hyperplasia)   . Erectile dysfunction   . Left leg weakness   . Encounter for adjustment or management of automatic implantable cardioverter-defibrillator 02/07/2014  . Paroxysmal a-fib ([place]) 12/26/2013  . Automatic implantable cardioverter-defibrillator in situ 12/15/2013  . Osteoarthritis of left wrist 09/27/2010  . CAD (coronary artery disease) 05/14/2006    Past Surgical History:  Procedure Laterality Date  . CARDIAC CATHETERIZATION  06/27/2008  . CARPAL TUNNEL RELEASE    . CORONARY ARTERY BYPASS GRAFT  2006 and 2014   LIMA to LAD at [facility] 2014  . MITRAL VALVE REPAIR      Prior to Admission medications   Medication Sig Start Date End Date Taking? Authorizing Provider  aspirin 81 MG chewable tablet Chew 81 mg by mouth daily.     [provider]  atorvastatin (LIPITOR) 80 MG tablet TAKE 1 TABLET EVERY DAY 02/05/16   Birdie Sons, MD  empagliflozin (JARDIANCE) 10 MG TABS tablet Take 10 mg by mouth daily.    [provider]  furosemide (LASIX) 40 MG tablet Take 1 tablet (40 mg total) by mouth 2 (two) times daily. 01/31/18   Epifanio Lesches, MD  levothyroxine (SYNTHROID, LEVOTHROID) 25 MCG tablet Take 1 tablet by month in the mornings and 1 tablet by mouth in the evenings. 02/24/18   Birdie Sons, MD  lisinopril (PRINIVIL,ZESTRIL) 20 MG tablet TAKE 1 TABLET EVERY DAY 05/13/18   Birdie Sons, MD  metoprolol succinate (TOPROL-XL) 50 MG 24 hr tablet  Take 1 tablet (50 mg total) by mouth daily. 12/22/16   Birdie Sons, MD  predniSONE (STERAPRED UNI-PAK 21 TAB) 10 MG (21) TBPK tablet Taper by 10  Mg daily Patient not taking: Reported on 02/16/2018 01/31/18   Epifanio Lesches, MD     Allergies Spironolactone; Entresto [sacubitril-valsartan]; and Ziac [bisoprolol-hydrochlorothiazide]  Family History  Problem Relation Age of Onset  . CAD Mother 84  . Diabetes Mother        Diabetes Mellitus  . Dementia Father   . Glaucoma Father   . Heart  disease Other     Social History Social History   Tobacco Use  . Smoking status: Former Smoker    Last attempt to quit: 12/01/2004    Years since quitting: 14.4  . Smokeless tobacco: Never Used  Substance Use Topics  . Alcohol use: Yes    Alcohol/week: 7.0 - 9.0 standard drinks    Types: 1 Glasses of wine, 6 - 8 Cans of beer per week  . Drug use: No    Review of Systems  Constitutional: No fever/chills Eyes: No visual changes.  ENT: No sore throat. Cardiovascular: Denies chest pain. Respiratory: As above Gastrointestinal: No abdominal pain.  No nausea, no vomiting.   Genitourinary: Negative for dysuria. Musculoskeletal: Bilateral lower extremity edema Skin: Negative for rash. Neurological: Negative for headaches or weakness   ____________________________________________   PHYSICAL EXAM:  VITAL SIGNS: ED Triage Vitals [04/29/19 1445]  Enc Vitals Group     BP 114/81     Pulse Rate (!) 112     Resp (!) 22     Temp 98.5 F (36.9 C)     Temp Source Oral     SpO2 97 %     Weight 81.6 kg (180 lb)     Height 1.778 m (5\' 10" )     Head Circumference      Peak Flow      Pain Score 0     Pain Loc      Pain Edu?      Excl. in [place]?     Constitutional: Alert and oriented.  Eyes: Conjunctivae are normal.   Nose: No congestion/rhinnorhea. Mouth/Throat: Mucous membranes are moist.    Cardiovascular: Normal rate, regular rhythm. Grossly normal heart sounds.  Good peripheral circulation. Respiratory: Increased respiratory effort with tachypnea no retractions.  Bibasilar Rales Gastrointestinal: Soft and nontender. No distention.  No CVA tenderness. Genitourinary: deferred Musculoskeletal: 1+ edema bilaterally Neurologic:  Normal speech and language. No gross focal neurologic deficits are appreciated.  Skin:  Skin is warm, dry and intact. No rash noted. Psychiatric: Mood and affect are normal. Speech and behavior are normal.  ____________________________________________    LABS (all labs ordered are listed, but only abnormal results are displayed)  Labs Reviewed  BASIC METABOLIC PANEL - Abnormal; Notable for the following components:      Result Value   Potassium 3.2 (*)    Glucose, Bld 103 (*)    BUN 44 (*)    Creatinine, Ser 2.68 (*)    GFR calc non Af Amer 23 (*)    GFR calc Af Amer 27 (*)    All other components within normal limits  CBC - Abnormal; Notable for the following components:   Hemoglobin 17.6 (*)    Platelets 132 (*)    All other components within normal limits  TROPONIN I - Abnormal; Notable for the following components:   Troponin I 0.15 (*)    All other components  within normal limits  BRAIN NATRIURETIC PEPTIDE - Abnormal; Notable for the following components:   B Natriuretic Peptide 457.0 (*)    All other components within normal limits  SARS CORONAVIRUS 2 (HOSPITAL ORDER, [place] [facility])   ____________________________________________  EKG  ED ECG REPORT I, Lavonia Drafts, the attending physician, personally viewed and interpreted this ECG.  Date: 04/29/2019  Rhythm: Sinus tachycardia QRS Axis: Abnormal Intervals: Abnormal ST/T Wave abnormalities: Nonspecific changes   ____________________________________________  RADIOLOGY  Chest x-ray demonstrates vascular congestion ____________________________________________   PROCEDURES  Procedure(s) performed: No  Procedures   Critical Care performed: yes  CRITICAL CARE Performed by: Lavonia Drafts   Total critical care time: 30 minutes  Critical care time was exclusive of separately billable procedures and treating other patients.  Critical care was necessary to treat or prevent imminent or life-threatening deterioration.  Critical care was time spent personally by me on the following activities: development of treatment plan with patient and/or surrogate as well as nursing, discussions with consultants, evaluation of patient's  response to treatment, examination of patient, obtaining history from patient or surrogate, ordering and performing treatments and interventions, ordering and review of laboratory studies, ordering and review of radiographic studies, pulse oximetry and re-evaluation of patient's condition.  ____________________________________________   INITIAL IMPRESSION / ASSESSMENT AND PLAN / ED COURSE  Pertinent labs & imaging results that were available during my care of the patient were reviewed by me and considered in my medical decision making (see chart for details).  Patient presents with shortness of breath, lower extremity edema most consistent with CHF exacerbation versus pulmonary edema.  No fevers or myalgias to suggest MCNOB-09 but is certainly a possibility.  Chest x-ray demonstrates vascular congestion, the patient does get markedly short of breath with exertion, is significant tachycardic as well.  Troponin noted to be elevated at 0.15 this is likely related to demand ischemia.  We will give the patient IV Lasix and admit to the hospitalist service    ____________________________________________   FINAL CLINICAL IMPRESSION(S) / ED DIAGNOSES  Final diagnoses:  Elevated troponin  Acute on chronic congestive heart failure, unspecified heart failure type [facility])        Note:  This document was prepared using Dragon voice recognition software and may include unintentional dictation errors.   Lavonia Drafts, MD 04/29/19 [phone]

## 2019-04-29 NOTE — ED Triage Notes (Signed)
Pt via pov from home with edema x 1 week. Pt states "my fluid pills aren't working." Pt states he had not had his normal urination after taking diuretic, and he feels very short of breath; has swollen ankles. Pt has hx of MI & CHF; called cardiologist, who told him to come here. Pt alert & oriented, nad noted.

## 2019-04-30 ENCOUNTER — Inpatient Hospital Stay: Payer: Medicare HMO

## 2019-04-30 ENCOUNTER — Inpatient Hospital Stay
Admit: 2019-04-30 | Discharge: 2019-04-30 | Disposition: A | Discharge status: Home / Self Care | Payer: Medicare HMO | Attending: Internal Medicine | Admitting: Internal Medicine

## 2019-04-30 LAB — BASIC METABOLIC PANEL
Anion gap: 14 (ref 5–15)
BUN: 46 mg/dL — ABNORMAL HIGH (ref 8–23)
CO2: 23 mmol/L (ref 22–32)
Calcium: 9.1 mg/dL (ref 8.9–10.3)
Chloride: 100 mmol/L (ref 98–111)
Creatinine, Ser: 2.73 mg/dL — ABNORMAL HIGH (ref 0.61–1.24)
GFR calc Af Amer: 26 mL/min — ABNORMAL LOW (ref >60)
GFR calc non Af Amer: 23 mL/min — ABNORMAL LOW (ref >60)
Glucose, Bld: 123 mg/dL — ABNORMAL HIGH (ref 70–99)
Potassium: 3.4 mmol/L — ABNORMAL LOW (ref 3.5–5.1)
Sodium: 137 mmol/L (ref 135–145)

## 2019-04-30 LAB — CBC WITH DIFFERENTIAL/PLATELET
Abs Immature Granulocytes: 0.03 10*3/uL (ref 0.00–0.07)
Basophils Absolute: 0 10*3/uL (ref 0.0–0.1)
Basophils Relative: 1 %
Eosinophils Absolute: 0.1 10*3/uL (ref 0.0–0.5)
Eosinophils Relative: 1 %
HCT: 52.7 % — ABNORMAL HIGH (ref 39.0–52.0)
Hemoglobin: 18 g/dL — ABNORMAL HIGH (ref 13.0–17.0)
Immature Granulocytes: 0 %
Lymphocytes Relative: 13 %
Lymphs Abs: 1 10*3/uL (ref 0.7–4.0)
MCH: 31.5 pg (ref 26.0–34.0)
MCHC: 34.2 g/dL (ref 30.0–36.0)
MCV: 92.1 fL (ref 80.0–100.0)
Monocytes Absolute: 1 10*3/uL (ref 0.1–1.0)
Monocytes Relative: 13 %
Neutro Abs: 5.6 10*3/uL (ref 1.7–7.7)
Neutrophils Relative %: 72 %
Platelets: 129 10*3/uL — ABNORMAL LOW (ref 150–400)
RBC: 5.72 MIL/uL (ref 4.22–5.81)
RDW: 13.5 % (ref 11.5–15.5)
WBC: 7.7 10*3/uL (ref 4.0–10.5)
nRBC: 0 % (ref 0.0–0.2)

## 2019-04-30 LAB — TROPONIN I
Troponin I: 0.14 ng/mL (ref <0.03)
Troponin I: 0.14 ng/mL (ref <0.03)

## 2019-04-30 LAB — HEPARIN LEVEL (UNFRACTIONATED): Heparin Unfractionated: 0.59 IU/mL (ref 0.30–0.70)

## 2019-04-30 MED ORDER — IOHEXOL 240 MG/ML SOLN
25.0000 mL | INTRAMUSCULAR | Status: AC
  Administered 2019-04-30 (×2): 25 mL via ORAL

## 2019-04-30 MED ORDER — HEPARIN SODIUM (PORCINE) 5000 UNIT/ML IJ SOLN
5000.0000 [IU] | Freq: Three times a day (TID) | INTRAMUSCULAR | Status: DC
  Administered 2019-05-01: 5000 [IU] via SUBCUTANEOUS
  Filled 2019-04-30 (×4): qty 1

## 2019-04-30 NOTE — Progress Notes (Signed)
[place] at [facility]                                                                                                                                                                                  Patient Demographics   Chris Nguyen, is a 68 y.o. male, DOB - [birth year], WUJ:[id number]  Admit date - 04/29/2019   Admitting Physician Sela Hua, MD  Outpatient Primary MD for the patient is Birdie Sons, MD   LOS - 1  Subjective: Patient continues to complain of shortness of breath, also complains of abdominal distention    Review of Systems:   CONSTITUTIONAL: No documented fever. No fatigue, weakness. No weight gain, no weight loss.  EYES: No blurry or double vision.  ENT: No tinnitus. No postnasal drip. No redness of the oropharynx.  RESPIRATORY: No cough, no wheeze, no hemoptysis.  Positive dyspnea.  CARDIOVASCULAR: No chest pain. No orthopnea. No palpitations. No syncope.  GASTROINTESTINAL: No nausea, no vomiting or diarrhea. No abdominal pain. No melena or hematochezia.  Positive abdominal distention GENITOURINARY: No dysuria or hematuria.  ENDOCRINE: No polyuria or nocturia. No heat or cold intolerance.  HEMATOLOGY: No anemia. No bruising. No bleeding.  INTEGUMENTARY: No rashes. No lesions.  MUSCULOSKELETAL: No arthritis. No swelling. No gout.  NEUROLOGIC: No numbness, tingling, or ataxia. No seizure-type activity.  PSYCHIATRIC: No anxiety. No insomnia. No ADD.    Vitals:   Vitals:   04/29/19 2130 04/29/19 2201 04/30/19 0523 04/30/19 0837  BP: (!) 105/92 (!) 121/95 111/63 (!) 126/104  Pulse: (!) 113 93 90 98  Resp: 18 16 16 19   Temp:  97.9 F (36.6 C) 97.8 F (36.6 C)   TempSrc:  Oral Oral   SpO2: 94% 100% 100% 96%  Weight:  83.8 kg 83.3 kg   Height:  5\' 10"  (1.778 m)      Wt Readings from Last 3 Encounters:  04/30/19 83.3 kg  01/31/18 81.9 kg  01/19/18 82.1 kg     Intake/Output Summary (Last 24 hours) at 04/30/2019  1535 Last data filed at 04/30/2019 1456 Gross per 24 hour  Intake -  Output 855 ml  Net -855 ml    Physical Exam:   GENERAL: Pleasant-appearing in no apparent distress.  HEAD, EYES, EARS, NOSE AND THROAT: Atraumatic, normocephalic. Extraocular muscles are intact. Pupils equal and reactive to light. Sclerae anicteric. No conjunctival injection. No oro-pharyngeal erythema.  NECK: Supple. There is no jugular venous distention. No bruits, no lymphadenopathy, no thyromegaly.  HEART: Regular rate and rhythm,. No murmurs, no rubs, no clicks.  LUNGS: Creased breath sounds bilaterally without any accessory muscle use aBDOMEN: Soft, flat, nontender, nondistended.  Has good bowel sounds. No hepatosplenomegaly appreciated.  EXTREMITIES: No evidence of any cyanosis, clubbing, or peripheral edema.  +2 pedal and radial pulses bilaterally.  NEUROLOGIC: The patient is alert, awake, and oriented x3 with no focal motor or sensory deficits appreciated bilaterally.  SKIN: Moist and warm with no rashes appreciated.  Psych: Not anxious, depressed LN: No inguinal LN enlargement    Antibiotics   Anti-infectives (From admission, onward)   None      Medications   Scheduled Meds: . amiodarone  200 mg Oral BID  . aspirin EC  81 mg Oral Daily  . furosemide  40 mg Intravenous BID  . sodium chloride flush  3 mL Intravenous Q12H   Continuous Infusions: . sodium chloride     PRN Meds:.sodium chloride, acetaminophen, ondansetron (ZOFRAN) IV, sodium chloride flush, traMADol   Data Review:   Micro Results Recent Results (from the past 240 hour(s))  SARS Coronavirus 2 (CEPHEID - Performed in [place] hospital lab), Hosp Order     Status: None   Collection Time: 04/29/19  4:00 PM  Result Value Ref Range Status   SARS Coronavirus 2 NEGATIVE NEGATIVE Final    Comment: (NOTE) If result is NEGATIVE SARS-CoV-2 target nucleic acids are NOT DETECTED. The SARS-CoV-2 RNA is generally detectable in upper and  lower  respiratory specimens during the acute phase of infection. The lowest  concentration of SARS-CoV-2 viral copies this assay can detect is 250  copies / mL. A negative result does not preclude SARS-CoV-2 infection  and should not be used as the sole basis for treatment or other  patient management decisions.  A negative result may occur with  improper specimen collection / handling, submission of specimen other  than nasopharyngeal swab, presence of viral mutation(s) within the  areas targeted by this assay, and inadequate number of viral copies  (<250 copies / mL). A negative result must be combined with clinical  observations, patient history, and epidemiological information. If result is POSITIVE SARS-CoV-2 target nucleic acids are DETECTED. The SARS-CoV-2 RNA is generally detectable in upper and lower  respiratory specimens dur ing the acute phase of infection.  Positive  results are indicative of active infection with SARS-CoV-2.  Clinical  correlation with patient history and other diagnostic information is  necessary to determine patient infection status.  Positive results do  not rule out bacterial infection or co-infection with other viruses. If result is PRESUMPTIVE POSTIVE SARS-CoV-2 nucleic acids MAY BE PRESENT.   A presumptive positive result was obtained on the submitted specimen  and confirmed on repeat testing.  While 2019 novel coronavirus  (SARS-CoV-2) nucleic acids may be present in the submitted sample  additional confirmatory testing may be necessary for epidemiological  and / or clinical management purposes  to differentiate between  SARS-CoV-2 and other Sarbecovirus currently known to infect humans.  If clinically indicated additional testing with an alternate test  methodology [phone]) is advised. The SARS-CoV-2 RNA is generally  detectable in upper and lower respiratory sp ecimens during the acute  phase of infection. The expected result is  Negative. Fact Sheet for Patients:  StrictlyIdeas.no Fact Sheet for Healthcare Providers: BankingDealers.co.za This test is not yet approved or cleared by the [place] FDA and has been authorized for detection and/or diagnosis of SARS-CoV-2 by FDA under an Emergency Use Authorization (EUA).  This EUA will remain in effect (meaning this test can be used) for the duration of the COVID-19 declaration under Section 564(b)(1) of the Act, 21  U.S.C. section 360bbb-3(b)(1), unless the authorization is terminated or revoked sooner. Performed at [facility], [address] [place], [place] [address]     Radiology Reports Dg Chest 1 View  Result Date: 04/29/2019 CLINICAL DATA:  Shortness of breath. Lower extremity swelling. EXAM: CHEST  1 VIEW COMPARISON:  01/28/2018 FINDINGS: Prior CABG, an ICD, cardiomegaly, and aortic atherosclerosis are again noted. There is central pulmonary vascular congestion without overt edema. No airspace consolidation, sizeable pleural effusion, pneumothorax is identified. No acute osseous abnormality is seen. IMPRESSION: Cardiomegaly and pulmonary vascular congestion. Electronically Signed   By: Logan Bores M.D.   On: 04/29/2019 15:39   US Renal  Result Date: 04/30/2019 CLINICAL DATA:  Acute kidney injury EXAM: RENAL / URINARY TRACT ULTRASOUND COMPLETE COMPARISON:  01/27/2018 FINDINGS: Right Kidney: Renal measurements: 8.1 x 4.3 x 4.4 cm = volume: 80 mL. Increased echogenicity. No hydronephrosis. No focal lesion. Left Kidney: Renal measurements: 9.2 x 5.5 x 5.3 cm = volume: 141 mL. Echogenicity within normal limits. No mass or hydronephrosis visualized. Bladder: Empty.  Patient recently voided. Right pleural effusion is noted. IMPRESSION: Since the prior study, the right kidney has gotten smaller. This suggests interval vascular insult. There is no evidence hydronephrosis. Left kidney is within normal limits.  Electronically Signed   By: Nelson Chimes M.D.   On: 04/30/2019 10:21     CBC Recent Labs  Lab 04/29/19 1451 04/30/19 0416  WBC 8.0 7.7  HGB 17.6* 18.0*  HCT 51.8 52.7*  PLT 132* 129*  MCV 90.6 92.1  MCH 30.8 31.5  MCHC 34.0 34.2  RDW 13.4 13.5  LYMPHSABS  --  1.0  MONOABS  --  1.0  EOSABS  --  0.1  BASOSABS  --  0.0    Chemistries  Recent Labs  Lab 04/29/19 1451 04/30/19 0416  NA 136 137  K 3.2* 3.4*  CL 100 100  CO2 22 23  GLUCOSE 103* 123*  BUN 44* 46*  CREATININE 2.68* 2.73*  CALCIUM 9.0 9.1   ------------------------------------------------------------------------------------------------------------------ estimated creatinine clearance is 26.7 mL/min (A) (by C-G formula based on SCr of 2.73 mg/dL (H)). ------------------------------------------------------------------------------------------------------------------ No results for input(s): HGBA1C in the last 72 hours. ------------------------------------------------------------------------------------------------------------------ No results for input(s): CHOL, HDL, LDLCALC, TRIG, CHOLHDL, LDLDIRECT in the last 72 hours. ------------------------------------------------------------------------------------------------------------------ No results for input(s): TSH, T4TOTAL, T3FREE, THYROIDAB in the last 72 hours.  Invalid input(s): FREET3 ------------------------------------------------------------------------------------------------------------------ No results for input(s): VITAMINB12, FOLATE, FERRITIN, TIBC, IRON, RETICCTPCT in the last 72 hours.  Coagulation profile Recent Labs  Lab 04/29/19 2318  INR 1.3*    No results for input(s): DDIMER in the last 72 hours.  Cardiac Enzymes Recent Labs  Lab 04/29/19 2205 04/30/19 0416 04/30/19 0954  TROPONINI 7.69* 0.14* 0.14*   ------------------------------------------------------------------------------------------------------------------ Invalid  input(s): POCBNP    Assessment & Plan  Patient 68 year old presenting with shortness of breath    Acute on chronic combined systolic and diastolic congestive heart failure with AICD in place- last ECHO in 2019 with EF 20-25% and G1DD.  Follows with Dr. Clayborn Bigness as an outpatient. -Continue IV Lasix -Not much urine output still very symptomatic -I will I will obtain a CT scan of the chest  Abdominal distention obtain a CT of the abdomen   AKI on chronic kidney disease -Holding home lisinopril -I have asked nephrology to see -Renal ultrasound shows shrinking of the 1 kidney suggestive of vascular insult  Elevated troponin with a history of CAD s/p CABG- likely secondary to demand ischemia in the  setting of CHF exacerbation and AKI.  No active chest pain. -There was one troponin out of 4 which was 7.69 this appears to be a lab error  Paroxysmal atrial fibrillation- mildly tachycardic here -Not on anticoagulation due to history of muscle hematoma -Continue home amiodarone  Hypokalemia-likely due to diuretics -We will give dose potassium recheck in the morning       Code Status Orders  (From admission, onward)         Start     Ordered   04/29/19 2201  Full code  Continuous     04/29/19 2200        Code Status History    Date Active Date Inactive Code Status Order ID Comments User Context   01/23/2018 1259 01/31/2018 1424 Full Code [id number]  Gorden Harms, MD Inpatient   01/16/2017 1521 01/19/2017 1503 Full Code [id number]  Vaughan Basta, MD Inpatient   02/25/2016 0058 02/25/2016 2016 Full Code [id number]  Lance Coon, MD ED           Consults  Nephrology, cardiology  DVT Prophylaxis  Lovenox   Lab Results  Component Value Date   PLT 129 (L) 04/30/2019     Time Spent in minutes   62min  Greater than 50% of time spent in care coordination and counseling patient regarding the condition and plan of care.   Dustin Flock M.D on 04/30/2019 at  3:35 PM  Between 7am to 6pm - Pager - [phone]  After 6pm go to [email or web address] - Proofreader  [employer]   Office  [phone]

## 2019-04-30 NOTE — Plan of Care (Signed)
  Problem: Education: Goal: Knowledge of General Education information will improve Description Including pain rating scale, medication(s)/side effects and non-pharmacologic comfort measures Outcome: Progressing   Problem: Clinical Measurements: Goal: Respiratory complications will improve Outcome: Progressing   Problem: Activity: Goal: Risk for activity intolerance will decrease Outcome: Progressing   Problem: Elimination: Goal: Will not experience complications related to urinary retention Outcome: Progressing   Problem: Elimination: Goal: Will not experience complications related to urinary retention Outcome: Progressing

## 2019-04-30 NOTE — Consult Note (Signed)
[place] for Heparin Dosing and Monitoring  Indication: chest pain/ACS  Allergies  Allergen Reactions  . Spironolactone Other (See Comments)    Hyperkalemia  . Entresto [Sacubitril-Valsartan] Diarrhea  . Ziac [Bisoprolol-Hydrochlorothiazide]    Patient Measurements: Height: 5\' 10"  (177.8 cm) Weight: 183 lb 9.6 oz (83.3 kg) IBW/kg (Calculated) : 73  Vital Signs: Temp: 97.8 F (36.6 C) (05/30 0523) Temp Source: Oral (05/30 0523) BP: 126/104 (05/30 0837) Pulse Rate: 98 (05/30 0837)  Labs: Recent Labs    04/29/19 1451 04/29/19 2205 04/29/19 2318 04/30/19 0416 04/30/19 0749  HGB 17.6*  --   --  18.0*  --   HCT 51.8  --   --  52.7*  --   PLT 132*  --   --  129*  --   APTT  --   --  33  --   --   LABPROT  --   --  16.4*  --   --   INR  --   --  1.3*  --   --   HEPARINUNFRC  --   --   --   --  0.59  CREATININE 2.68*  --   --  2.73*  --   TROPONINI 0.15* 7.69*  --  0.14*  --     Estimated Creatinine Clearance: 26.7 mL/min (A) (by C-G formula based on SCr of 2.73 mg/dL (H)).   Medical History: Past Medical History:  Diagnosis Date  . AICD (automatic cardioverter/defibrillator) present   . BPH (benign prostatic hyperplasia)   . CAD (coronary artery disease) 05/14/2006  . Cardiac arrest ([place]) 11/12/2013   Overview:  December 2014: ICD implanted  Last Assessment & Plan:  Relevant Hx: Course: Daily Update: Today's Plan:   . Chronic systolic CHF (congestive heart failure) ([place])   . Erectile dysfunction   . H/O coronary artery bypass surgery 05/17/2015   Overview:  LIMA to LAD   . HLD (hyperlipidemia)   . Osteoarthritis of left wrist   . Paroxysmal atrial fibrillation [facility])     Assessment: Pharmacy consulted for heparin infusion dosing and monitoring for ACS/STEMI.  5/29 @ 2205 Troponin I  7.69. No anticoagulants reported PTA.   CrCl: 27 mL/min  Goal of Therapy:  Heparin level 0.3-0.7 units/ml Monitor platelets by  anticoagulation protocol: Yes   Plan:  Baseline labs ordered.  Give 4000 units bolus x 1 Start heparin infusion at 950 units/hr Check anti-Xa level in 8 hours and daily while on heparin Continue to monitor H&H and platelets    5/30 ~ 08:00  HL = 0.59.  Continue current drip rate.  Recheck HL today at 16:00.   Olivia Canter, [facility] Clinical Pharmacist 04/30/2019 9:02 AM

## 2019-04-30 NOTE — Progress Notes (Signed)
[facility]  ROUNDING NOTE   Subjective:   [name] admitted to [facility] on 04/29/2019 for SOB (shortness of breath) [R06.02] Elevated troponin [R79.89] Acute on chronic congestive heart failure, unspecified heart failure type [facility]) [I50.9]  Patient states that since he was taken off an experimental drug provided by [facility], he started getting more shortness of breath. This was about 1 month ago.   However patient also has decreased frequency of his torsemide to every other day recently.   Evaluated by cardiology earlier this week where amiodarone was added.   Objective:  Vital signs in last 24 hours:  Temp:  [97.8 F (36.6 C)-98.5 F (36.9 C)] 97.8 F (36.6 C) (05/30 0523) Pulse Rate:  [90-113] 98 (05/30 0837) Resp:  [16-22] 19 (05/30 0837) BP: (105-126)/(63-104) 126/104 (05/30 0837) SpO2:  [94 %-100 %] 96 % (05/30 0837) Weight:  [81.6 kg-83.8 kg] 83.3 kg (05/30 0523)  Weight change:  Filed Weights   04/29/19 1445 04/29/19 2201 04/30/19 0523  Weight: 81.6 kg 83.8 kg 83.3 kg    Intake/Output: I/O last 3 completed shifts: In: -  Out: 625 [Urine:625]   Intake/Output this shift:  No intake/output data recorded.  Physical Exam: General: NAD,   Head: Normocephalic, atraumatic. Moist oral mucosal membranes  Eyes: Anicteric, PERRL  Neck: Supple, trachea midline  Lungs:  Clear to auscultation  Heart: irregular  Abdomen:  Soft, nontender,   Extremities:  + peripheral edema.  Neurologic: Nonfocal, moving all four extremities  Skin: No lesions    Basic Metabolic Panel: Recent Labs  Lab 04/29/19 1451 04/30/19 0416  NA 136 137  K 3.2* 3.4*  CL 100 100  CO2 22 23  GLUCOSE 103* 123*  BUN 44* 46*  CREATININE 2.68* 2.73*  CALCIUM 9.0 9.1    Liver Function Tests: No results for input(s): AST, ALT, ALKPHOS, BILITOT, PROT, ALBUMIN in the last 168 hours. No results for input(s): LIPASE, AMYLASE in the last 168 hours. No results for input(s): AMMONIA in  the last 168 hours.  CBC: Recent Labs  Lab 04/29/19 1451 04/30/19 0416  WBC 8.0 7.7  NEUTROABS  --  5.6  HGB 17.6* 18.0*  HCT 51.8 52.7*  MCV 90.6 92.1  PLT 132* 129*    Cardiac Enzymes: Recent Labs  Lab 04/29/19 1451 04/29/19 2205 04/30/19 0416  TROPONINI 0.15* 7.69* 0.14*    BNP: Invalid input(s): POCBNP  CBG: No results for input(s): GLUCAP in the last 168 hours.  Microbiology: Results for orders placed or performed during the hospital encounter of 04/29/19  SARS Coronavirus 2 (CEPHEID - Performed in [facility] hospital lab), Hosp Order     Status: None   Collection Time: 04/29/19  4:00 PM  Result Value Ref Range Status   SARS Coronavirus 2 NEGATIVE NEGATIVE Final    Comment: (NOTE) If result is NEGATIVE SARS-CoV-2 target nucleic acids are NOT DETECTED. The SARS-CoV-2 RNA is generally detectable in upper and lower  respiratory specimens during the acute phase of infection. The lowest  concentration of SARS-CoV-2 viral copies this assay can detect is 250  copies / mL. A negative result does not preclude SARS-CoV-2 infection  and should not be used as the sole basis for treatment or other  patient management decisions.  A negative result may occur with  improper specimen collection / handling, submission of specimen other  than nasopharyngeal swab, presence of viral mutation(s) within the  areas targeted by this assay, and inadequate number of viral copies  (<  250 copies / mL). A negative result must be combined with clinical  observations, patient history, and epidemiological information. If result is POSITIVE SARS-CoV-2 target nucleic acids are DETECTED. The SARS-CoV-2 RNA is generally detectable in upper and lower  respiratory specimens dur ing the acute phase of infection.  Positive  results are indicative of active infection with SARS-CoV-2.  Clinical  correlation with patient history and other diagnostic information is  necessary to determine patient  infection status.  Positive results do  not rule out bacterial infection or co-infection with other viruses. If result is PRESUMPTIVE POSTIVE SARS-CoV-2 nucleic acids MAY BE PRESENT.   A presumptive positive result was obtained on the submitted specimen  and confirmed on repeat testing.  While 2019 novel coronavirus  (SARS-CoV-2) nucleic acids may be present in the submitted sample  additional confirmatory testing may be necessary for epidemiological  and / or clinical management purposes  to differentiate between  SARS-CoV-2 and other Sarbecovirus currently known to infect humans.  If clinically indicated additional testing with an alternate test  methodology [phone]) is advised. The SARS-CoV-2 RNA is generally  detectable in upper and lower respiratory sp ecimens during the acute  phase of infection. The expected result is Negative. Fact Sheet for Patients:  StrictlyIdeas.no Fact Sheet for Healthcare Providers: BankingDealers.co.za This test is not yet approved or cleared by the [place] FDA and has been authorized for detection and/or diagnosis of SARS-CoV-2 by FDA under an Emergency Use Authorization (EUA).  This EUA will remain in effect (meaning this test can be used) for the duration of the COVID-19 declaration under Section 564(b)(1) of the Act, 21 U.S.C. section 360bbb-3(b)(1), unless the authorization is terminated or revoked sooner. Performed at [facility], [address] [place], [place] [address]     Coagulation Studies: Recent Labs    04/29/19 2318  LABPROT 16.4*  INR 1.3*    Urinalysis: No results for input(s): COLORURINE, LABSPEC, PHURINE, GLUCOSEU, HGBUR, BILIRUBINUR, KETONESUR, PROTEINUR, UROBILINOGEN, NITRITE, LEUKOCYTESUR in the last 72 hours.  Invalid input(s): APPERANCEUR    Imaging: Dg Chest 1 View  Result Date: 04/29/2019 CLINICAL DATA:  Shortness of breath. Lower extremity  swelling. EXAM: CHEST  1 VIEW COMPARISON:  01/28/2018 FINDINGS: Prior CABG, an ICD, cardiomegaly, and aortic atherosclerosis are again noted. There is central pulmonary vascular congestion without overt edema. No airspace consolidation, sizeable pleural effusion, pneumothorax is identified. No acute osseous abnormality is seen. IMPRESSION: Cardiomegaly and pulmonary vascular congestion. Electronically Signed   By: Logan Bores M.D.   On: 04/29/2019 15:39     Medications:   . sodium chloride    . heparin 950 Units/hr (04/30/19 0020)   . amiodarone  200 mg Oral BID  . aspirin EC  81 mg Oral Daily  . furosemide  40 mg Intravenous BID  . sodium chloride flush  3 mL Intravenous Q12H   sodium chloride, acetaminophen, ondansetron (ZOFRAN) IV, sodium chloride flush, traMADol  Assessment/ Plan:  [name] is a 68 y.o. black male with hypertension, congestive heart failure, atrial fibrillation, hyperlipidemia, coronary artery disease status post CABG, AICD, BPH, mitral valve repair, who was admitted to [facility] on5/29/2020 for SOB (shortness of breath) [R06.02] Elevated troponin [R79.89] Acute on chronic congestive heart failure, unspecified heart failure type ([place]) [I50.9]  1. Acute renal failure on chronic kidney disease stage III: baseline creatinine of1.53, GFR of 54 on 04/13/18 With proteinuria Acute renal failure due to acute cardiorenal syndrome Chronic kidney disease secondary to hypertension  2. Hypertension  3. Acute exacerbation of systolic and diastolic congestive heart failure. EF 25%  Plan - Continue IV furosemide 40mg  q12 - Fluid restriction - New echocardiogram ordered     LOS: 1 Harvel Meskill 5/30/20209:59 AM

## 2019-04-30 NOTE — Progress Notes (Signed)
Pt with medications pharmacy

## 2019-04-30 NOTE — Progress Notes (Signed)
Pt complains of feeling like his abd is full. Pt has 102ml out after urination. RN bladder scans patient. 10ml are in bladder post urination. I will continue to assess.

## 2019-04-30 NOTE — Consult Note (Signed)
Cardiology Consultation Note    Patient ID: Chris Nguyen, MRN: [id number], DOB/AGE: [birth year] 68 y.o. Admit date: 04/29/2019   Date of Consult: 04/30/2019 Primary Physician: Birdie Sons, MD Primary Cardiologist: Dr. Clayborn Bigness  Chief Complaint: SOB Reason for Consultation: CHF Requesting MD: Dr. Posey Pronto  HPI: Chris Nguyen is a 68 y.o. male with history of coronary artery disease status post single-vessel coronary artery bypass grafting with a LIMA to the LAD in the distant past, history of a cardiomyopathy with an ejection fraction of less than 25%, history of AICD in place, who presented to the emergency room with increasing shortness of breath.  He also has a history of paroxysmal atrial fibrillation.  He states his shortness of breath has progressively worsened over the previous 2 weeks prior to presentation with lower extremity edema.  He states he was compliant with torsemide which he has been taking as an outpatient however states he has not had much response to this.  On presentation he was tachycardic and tachypneic.  EKG showed accelerated junctional rhythm versus atrial fibrillation with a bundle branch block.  He appears to have ruled out for a myocardial infarction.  Serum troponins have been flat at 0.15 with a spurious value at 7.69.  He is on amiodarone at 200 mg twice daily.  He is improved somewhat with IV Lasix.  Patient has chronic renal failure with a this afternoon of 2.73 is suggestive of acute on chronic renal insufficiency.  With some proteinuria.  Past Medical History:  Diagnosis Date  . AICD (automatic cardioverter/defibrillator) present   . BPH (benign prostatic hyperplasia)   . CAD (coronary artery disease) 05/14/2006  . Cardiac arrest ([place]) 11/12/2013   Overview:  December 2014: ICD implanted  Last Assessment & Plan:  Relevant Hx: Course: Daily Update: Today's Plan:   . Chronic systolic CHF (congestive heart failure) ([place])   . Erectile dysfunction   . H/O  coronary artery bypass surgery 05/17/2015   Overview:  LIMA to LAD   . HLD (hyperlipidemia)   . Osteoarthritis of left wrist   . Paroxysmal atrial fibrillation [facility])       Surgical History:  Past Surgical History:  Procedure Laterality Date  . CARDIAC CATHETERIZATION  06/27/2008  . CARPAL TUNNEL RELEASE    . CORONARY ARTERY BYPASS GRAFT  2006 and 2014   LIMA to LAD at [facility] 2014  . MITRAL VALVE REPAIR       Home Meds: Prior to Admission medications   Medication Sig Start Date End Date Taking? Authorizing Provider  amiodarone (PACERONE) 200 MG tablet Take 200 mg by mouth See admin instructions. Take 1 tablet (200mg ) by mouth twice daily for 2 weeks then take 1 tablet (200mg ) by mouth daily 04/26/19  Yes [provider]  aspirin 81 MG EC tablet Take 81 mg by mouth daily.    Yes [provider]  lisinopril (PRINIVIL,ZESTRIL) 20 MG tablet TAKE 1 TABLET EVERY DAY Patient taking differently: Take 20 mg by mouth daily.  05/13/18  Yes Birdie Sons, MD  torsemide (DEMADEX) 20 MG tablet Take 40 mg by mouth 2 (two) times daily. 03/23/19  Yes [provider]  traMADol (ULTRAM) 50 MG tablet Take 100 mg by mouth every 12 (twelve) hours as needed for moderate pain.   Yes [provider]  atorvastatin (LIPITOR) 80 MG tablet TAKE 1 TABLET EVERY DAY Patient not taking: Reported on 04/29/2019 02/05/16   Birdie Sons, MD  furosemide (LASIX) 40  MG tablet Take 1 tablet (40 mg total) by mouth 2 (two) times daily. Patient not taking: Reported on 04/29/2019 01/31/18   Epifanio Lesches, MD  levothyroxine (SYNTHROID, LEVOTHROID) 25 MCG tablet Take 1 tablet by month in the mornings and 1 tablet by mouth in the evenings. Patient not taking: Reported on 04/29/2019 02/24/18   Birdie Sons, MD  metoprolol succinate (TOPROL-XL) 50 MG 24 hr tablet Take 1 tablet (50 mg total) by mouth daily. Patient not taking: Reported on 04/29/2019 12/22/16   Birdie Sons, MD    Inpatient  Medications:  . amiodarone  200 mg Oral BID  . aspirin EC  81 mg Oral Daily  . furosemide  40 mg Intravenous BID  . sodium chloride flush  3 mL Intravenous Q12H   . sodium chloride    . heparin 950 Units/hr (04/30/19 0020)    Allergies:  Allergies  Allergen Reactions  . Spironolactone Other (See Comments)    Hyperkalemia  . Entresto [Sacubitril-Valsartan] Diarrhea  . Ziac [Bisoprolol-Hydrochlorothiazide]     Social History   Socioeconomic History  . Marital status: Legally Separated    Spouse name: Not on file  . Number of children: 5  . Years of education: Not on file  . Highest education level: Not on file  Occupational History  . Occupation: Retired  Scientific laboratory technician  . Financial resource strain: Not on file  . Food insecurity:    Worry: Not on file    Inability: Not on file  . Transportation needs:    Medical: Not on file    Non-medical: Not on file  Tobacco Use  . Smoking status: Former Smoker    Last attempt to quit: 12/01/2004    Years since quitting: 14.4  . Smokeless tobacco: Never Used  Substance and Sexual Activity  . Alcohol use: Yes    Alcohol/week: 7.0 - 9.0 standard drinks    Types: 1 Glasses of wine, 6 - 8 Cans of beer per week  . Drug use: No  . Sexual activity: Not on file  Lifestyle  . Physical activity:    Days per week: Not on file    Minutes per session: Not on file  . Stress: Not on file  Relationships  . Social connections:    Talks on phone: Not on file    Gets together: Not on file    Attends religious service: Not on file    Active member of club or organization: Not on file    Attends meetings of clubs or organizations: Not on file    Relationship status: Not on file  . Intimate partner violence:    Fear of current or ex partner: Not on file    Emotionally abused: Not on file    Physically abused: Not on file    Forced sexual activity: Not on file  Other Topics Concern  . Not on file  Social History Narrative  . Not on file      Family History  Problem Relation Age of Onset  . CAD Mother 52  . Diabetes Mother        Diabetes Mellitus  . Dementia Father   . Glaucoma Father   . Heart disease Other      Review of Systems: A 12-system review of systems was performed and is negative except as noted in the HPI.  Labs: Recent Labs    04/29/19 1451 04/29/19 2205 04/30/19 0416 04/30/19 0954  TROPONINI 0.15* 7.69* 0.14* 0.14*  Lab Results  Component Value Date   WBC 7.7 04/30/2019   HGB 18.0 (H) 04/30/2019   HCT 52.7 (H) 04/30/2019   MCV 92.1 04/30/2019   PLT 129 (L) 04/30/2019    Recent Labs  Lab 04/30/19 0416  NA 137  K 3.4*  CL 100  CO2 23  BUN 46*  CREATININE 2.73*  CALCIUM 9.1  GLUCOSE 123*   Lab Results  Component Value Date   CHOL 215 (H) 01/25/2018   HDL 62 01/25/2018   LDLCALC 142 (H) 01/25/2018   TRIG 57 01/25/2018   No results found for: DDIMER  Radiology/Studies:  Dg Chest 1 View  Result Date: 04/29/2019 CLINICAL DATA:  Shortness of breath. Lower extremity swelling. EXAM: CHEST  1 VIEW COMPARISON:  01/28/2018 FINDINGS: Prior CABG, an ICD, cardiomegaly, and aortic atherosclerosis are again noted. There is central pulmonary vascular congestion without overt edema. No airspace consolidation, sizeable pleural effusion, pneumothorax is identified. No acute osseous abnormality is seen. IMPRESSION: Cardiomegaly and pulmonary vascular congestion. Electronically Signed   By: Logan Bores M.D.   On: 04/29/2019 15:39   US Renal  Result Date: 04/30/2019 CLINICAL DATA:  Acute kidney injury EXAM: RENAL / URINARY TRACT ULTRASOUND COMPLETE COMPARISON:  01/27/2018 FINDINGS: Right Kidney: Renal measurements: 8.1 x 4.3 x 4.4 cm = volume: 80 mL. Increased echogenicity. No hydronephrosis. No focal lesion. Left Kidney: Renal measurements: 9.2 x 5.5 x 5.3 cm = volume: 141 mL. Echogenicity within normal limits. No mass or hydronephrosis visualized. Bladder: Empty.  Patient recently voided. Right  pleural effusion is noted. IMPRESSION: Since the prior study, the right kidney has gotten smaller. This suggests interval vascular insult. There is no evidence hydronephrosis. Left kidney is within normal limits. Electronically Signed   By: Nelson Chimes M.D.   On: 04/30/2019 10:21    Wt Readings from Last 3 Encounters:  04/30/19 83.3 kg  01/31/18 81.9 kg  01/19/18 82.1 kg    EKG: Junctional tachycardia with bundle branch block versus aberrant atrial fibrillation  Physical Exam: African-American male with shortness of breath Blood pressure (!) 126/104, pulse 98, temperature 97.8 F (36.6 C), temperature source Oral, resp. rate 19, height 5\' 10"  (1.778 m), weight 83.3 kg, SpO2 96 %. Body mass index is 26.34 kg/m. General: Well developed, well nourished, in no acute distress. Head: Normocephalic, atraumatic, sclera non-icteric, no xanthomas, nares are without discharge.  Neck: Negative for carotid bruits. JVD not elevated. Lungs: Decreased breath sounds bilaterally. Heart: RRR with S1 S2. No murmurs, rubs, or gallops appreciated. Abdomen: Soft, non-tender, non-distended with normoactive bowel sounds. No hepatomegaly. No rebound/guarding. No obvious abdominal masses. Msk:  Strength and tone appear normal for age. Extremities: No clubbing or cyanosis. No edema.  Distal pedal pulses are 2+ and equal bilaterally. Neuro: Alert and oriented X 3. No facial asymmetry. No focal deficit. Moves all extremities spontaneously. Psych:  Responds to questions appropriately with a normal affect.     Assessment and Plan  68 year old male with history of coronary artery disease status post a left internal mammary to the LAD, history of an ejection fraction of 20 to 25% with an AICD in place.  Did not tolerate Entresto, spironolactone.  Worsening shortness of breath is of unclear etiology.  Appears to have ruled out for myocardial infarction.  Will discontinue heparin.  Acute on chronic renal insufficiency.   Appreciate nephrology input.  Currently does not appear to be having acute coronary event.  Troponin appears flat secondary to renal insufficiency and demand ischemia.  Continue with careful diuresis as you are doing.  Chris Booty MD 04/30/2019, 12:55 PM Pager: [phone]

## 2019-05-01 LAB — BLOOD GAS, ARTERIAL
Acid-Base Excess: 2.3 mmol/L — ABNORMAL HIGH (ref 0.0–2.0)
Bicarbonate: 26.5 mmol/L (ref 20.0–28.0)
FIO2: 0.28
O2 Saturation: 98.6 %
Patient temperature: 37
pCO2 arterial: 39 mmHg (ref 32.0–48.0)
pH, Arterial: 7.44 (ref 7.350–7.450)
pO2, Arterial: 114 mmHg — ABNORMAL HIGH (ref 83.0–108.0)

## 2019-05-01 LAB — ECHOCARDIOGRAM COMPLETE
Height: 70 in
Weight: 2937.6 oz

## 2019-05-01 LAB — BASIC METABOLIC PANEL
Anion gap: 15 (ref 5–15)
BUN: 48 mg/dL — ABNORMAL HIGH (ref 8–23)
CO2: 23 mmol/L (ref 22–32)
Calcium: 9.2 mg/dL (ref 8.9–10.3)
Chloride: 99 mmol/L (ref 98–111)
Creatinine, Ser: 2.55 mg/dL — ABNORMAL HIGH (ref 0.61–1.24)
GFR calc Af Amer: 29 mL/min — ABNORMAL LOW (ref >60)
GFR calc non Af Amer: 25 mL/min — ABNORMAL LOW (ref >60)
Glucose, Bld: 94 mg/dL (ref 70–99)
Potassium: 3.7 mmol/L (ref 3.5–5.1)
Sodium: 137 mmol/L (ref 135–145)

## 2019-05-01 LAB — HIV ANTIBODY (ROUTINE TESTING W REFLEX): HIV Screen 4th Generation wRfx: NONREACTIVE

## 2019-05-01 MED ORDER — METHYLPREDNISOLONE SODIUM SUCC 125 MG IJ SOLR
60.0000 mg | Freq: Four times a day (QID) | INTRAMUSCULAR | Status: DC
  Administered 2019-05-01: 60 mg via INTRAVENOUS
  Filled 2019-05-01: qty 2

## 2019-05-01 MED ORDER — ALUM & MAG HYDROXIDE-SIMETH 200-200-20 MG/5ML PO SUSP
30.0000 mL | Freq: Four times a day (QID) | ORAL | Status: DC | PRN
  Administered 2019-05-01: 30 mL via ORAL
  Filled 2019-05-01: qty 30

## 2019-05-01 MED ORDER — ALBUMIN HUMAN 25 % IV SOLN
12.5000 g | Freq: Once | INTRAVENOUS | Status: AC
  Administered 2019-05-01: 23:00:00 12.5 g via INTRAVENOUS
  Filled 2019-05-01: qty 50

## 2019-05-01 MED ORDER — FUROSEMIDE 40 MG PO TABS
40.0000 mg | ORAL_TABLET | Freq: Two times a day (BID) | ORAL | Status: DC
  Administered 2019-05-01: 40 mg via ORAL
  Filled 2019-05-01: qty 1

## 2019-05-01 MED ORDER — LEVALBUTEROL HCL 1.25 MG/0.5ML IN NEBU
1.2500 mg | INHALATION_SOLUTION | Freq: Four times a day (QID) | RESPIRATORY_TRACT | Status: DC
  Administered 2019-05-01 – 2019-05-02 (×5): 1.25 mg via RESPIRATORY_TRACT
  Filled 2019-05-01 (×8): qty 0.5

## 2019-05-01 MED ORDER — IPRATROPIUM-ALBUTEROL 0.5-2.5 (3) MG/3ML IN SOLN: 3.0000 mL | RESPIRATORY_TRACT | Status: DC

## 2019-05-01 MED ORDER — BUDESONIDE 0.25 MG/2ML IN SUSP
0.2500 mg | Freq: Two times a day (BID) | RESPIRATORY_TRACT | Status: DC
  Administered 2019-05-01 – 2019-05-02 (×3): 0.25 mg via RESPIRATORY_TRACT
  Filled 2019-05-01 (×4): qty 2

## 2019-05-01 NOTE — Progress Notes (Signed)
[place] at [facility]                                                                                                                                                                                  Patient Demographics   Haygen Zebrowski, is a 68 y.o. male, DOB - [birth year], CXK:[id number]  Admit date - 04/29/2019   Admitting Physician Sela Hua, MD  Outpatient Primary MD for the patient is Birdie Sons, MD   LOS - 2  Subjective: Patient is breathing is improved after starting nebulizer therapy.    Review of Systems:   CONSTITUTIONAL: No documented fever. No fatigue, weakness. No weight gain, no weight loss.  EYES: No blurry or double vision.  ENT: No tinnitus. No postnasal drip. No redness of the oropharynx.  RESPIRATORY: No cough, no wheeze, no hemoptysis.  Positive dyspnea.  CARDIOVASCULAR: No chest pain. No orthopnea. No palpitations. No syncope.  GASTROINTESTINAL: No nausea, no vomiting or diarrhea. No abdominal pain. No melena or hematochezia.  Positive abdominal distention GENITOURINARY: No dysuria or hematuria.  ENDOCRINE: No polyuria or nocturia. No heat or cold intolerance.  HEMATOLOGY: No anemia. No bruising. No bleeding.  INTEGUMENTARY: No rashes. No lesions.  MUSCULOSKELETAL: No arthritis. No swelling. No gout.  NEUROLOGIC: No numbness, tingling, or ataxia. No seizure-type activity.  PSYCHIATRIC: No anxiety. No insomnia. No ADD.    Vitals:   Vitals:   04/30/19 2355 05/01/19 0015 05/01/19 0432 05/01/19 0716  BP: 126/88  (!) 116/93 139/87  Pulse: (!) 56 (!) 110 (!) 109 (!) 109  Resp: (!) 24 20 18 19   Temp: 97.7 F (36.5 C)  98.2 F (36.8 C) 97.9 F (36.6 C)  TempSrc: Oral  Oral Oral  SpO2: 99% 100% 90% 98%  Weight:   83.5 kg   Height:        Wt Readings from Last 3 Encounters:  05/01/19 83.5 kg  01/31/18 81.9 kg  01/19/18 82.1 kg     Intake/Output Summary (Last 24 hours) at 05/01/2019 1421 Last data filed at  05/01/2019 0845 Gross per 24 hour  Intake -  Output 410 ml  Net -410 ml    Physical Exam:   GENERAL: Pleasant-appearing in no apparent distress.  HEAD, EYES, EARS, NOSE AND THROAT: Atraumatic, normocephalic. Extraocular muscles are intact. Pupils equal and reactive to light. Sclerae anicteric. No conjunctival injection. No oro-pharyngeal erythema.  NECK: Supple. There is no jugular venous distention. No bruits, no lymphadenopathy, no thyromegaly.  HEART: Regular rate and rhythm,. No murmurs, no rubs, no clicks.  LUNGS: deCreased breath sounds bilaterally without any accessory muscle use aBDOMEN: Soft, flat, nontender, nondistended. Has good bowel  sounds. No hepatosplenomegaly appreciated.  EXTREMITIES: No evidence of any cyanosis, clubbing, or peripheral edema.  +2 pedal and radial pulses bilaterally.  NEUROLOGIC: The patient is alert, awake, and oriented x3 with no focal motor or sensory deficits appreciated bilaterally.  SKIN: Moist and warm with no rashes appreciated.  Psych: Not anxious, depressed LN: No inguinal LN enlargement    Antibiotics   Anti-infectives (From admission, onward)   None      Medications   Scheduled Meds: . amiodarone  200 mg Oral BID  . aspirin EC  81 mg Oral Daily  . budesonide (PULMICORT) nebulizer solution  0.25 mg Nebulization BID  . furosemide  40 mg Intravenous BID  . heparin injection (subcutaneous)  5,000 Units Subcutaneous Q8H  . levalbuterol  1.25 mg Nebulization Q6H  . methylPREDNISolone (SOLU-MEDROL) injection  60 mg Intravenous Q6H  . sodium chloride flush  3 mL Intravenous Q12H   Continuous Infusions: . sodium chloride     PRN Meds:.sodium chloride, acetaminophen, ondansetron (ZOFRAN) IV, sodium chloride flush, traMADol   Data Review:   Micro Results Recent Results (from the past 240 hour(s))  SARS Coronavirus 2 (CEPHEID - Performed in [place] hospital lab), Hosp Order     Status: None   Collection Time: 04/29/19  4:00 PM   Result Value Ref Range Status   SARS Coronavirus 2 NEGATIVE NEGATIVE Final    Comment: (NOTE) If result is NEGATIVE SARS-CoV-2 target nucleic acids are NOT DETECTED. The SARS-CoV-2 RNA is generally detectable in upper and lower  respiratory specimens during the acute phase of infection. The lowest  concentration of SARS-CoV-2 viral copies this assay can detect is 250  copies / mL. A negative result does not preclude SARS-CoV-2 infection  and should not be used as the sole basis for treatment or other  patient management decisions.  A negative result may occur with  improper specimen collection / handling, submission of specimen other  than nasopharyngeal swab, presence of viral mutation(s) within the  areas targeted by this assay, and inadequate number of viral copies  (<250 copies / mL). A negative result must be combined with clinical  observations, patient history, and epidemiological information. If result is POSITIVE SARS-CoV-2 target nucleic acids are DETECTED. The SARS-CoV-2 RNA is generally detectable in upper and lower  respiratory specimens dur ing the acute phase of infection.  Positive  results are indicative of active infection with SARS-CoV-2.  Clinical  correlation with patient history and other diagnostic information is  necessary to determine patient infection status.  Positive results do  not rule out bacterial infection or co-infection with other viruses. If result is PRESUMPTIVE POSTIVE SARS-CoV-2 nucleic acids MAY BE PRESENT.   A presumptive positive result was obtained on the submitted specimen  and confirmed on repeat testing.  While 2019 novel coronavirus  (SARS-CoV-2) nucleic acids may be present in the submitted sample  additional confirmatory testing may be necessary for epidemiological  and / or clinical management purposes  to differentiate between  SARS-CoV-2 and other Sarbecovirus currently known to infect humans.  If clinically indicated additional  testing with an alternate test  methodology [phone]) is advised. The SARS-CoV-2 RNA is generally  detectable in upper and lower respiratory sp ecimens during the acute  phase of infection. The expected result is Negative. Fact Sheet for Patients:  StrictlyIdeas.no Fact Sheet for Healthcare Providers: BankingDealers.co.za This test is not yet approved or cleared by the [place] FDA and has been authorized for detection and/or diagnosis of  SARS-CoV-2 by FDA under an Emergency Use Authorization (EUA).  This EUA will remain in effect (meaning this test can be used) for the duration of the COVID-19 declaration under Section 564(b)(1) of the Act, 21 U.S.C. section 360bbb-3(b)(1), unless the authorization is terminated or revoked sooner. Performed at [facility], [address], [place], [place] [address]     Radiology Reports Ct Abdomen Pelvis Wo Contrast  Result Date: 04/30/2019 CLINICAL DATA:  Heart failure short of breath abdominal fullness EXAM: CT CHEST, ABDOMEN AND PELVIS WITHOUT CONTRAST TECHNIQUE: Multidetector CT imaging of the chest, abdomen and pelvis was performed following the standard protocol without IV contrast. COMPARISON:  Ultrasound 04/30/2019, chest x-ray 04/29/2019 FINDINGS: CT CHEST FINDINGS Cardiovascular: Moderate aortic atherosclerosis without aneurysm. Extensive coronary vascular calcification. Cardiomegaly. No significant pericardial effusion. Post CABG changes. Left-sided cardiac pacing device with single lead tip at the right ventricle. Mediastinum/Nodes: Midline trachea. No thyroid mass. No significant adenopathy. Esophagus shows mild air distention but is otherwise unremarkable Lungs/Pleura: Moderate emphysema. Trace left pleural effusion. Small moderate right pleural effusion. No focal consolidation. 3 mm subpleural left upper lobe lung nodule, series 4, image number 74. Musculoskeletal: Post sternotomy  changes. No acute or suspicious osseous lesion CT ABDOMEN PELVIS FINDINGS Hepatobiliary: No focal hepatic abnormality. Hyperdense sludge or small stones in the gallbladder. No biliary dilatation Pancreas: Unremarkable. No pancreatic ductal dilatation or surrounding inflammatory changes. Spleen: Normal in size without focal abnormality. Adrenals/Urinary Tract: Adrenal glands are normal. No hydronephrosis. Scarring and mild atrophy of the right kidney, most prominent involving the lower pole. The bladder is unremarkable. Stomach/Bowel: Stomach is within normal limits. Appendix appears normal. No evidence of bowel wall thickening, distention, or inflammatory changes. Vascular/Lymphatic: Moderate aortic atherosclerosis. Focal ectasia of the distal infrarenal abdominal aorta measuring up to 2.8 cm. No significantly enlarged lymph nodes Reproductive: Slightly enlarged prostate with scattered calcification Other: No free air. Small amount of free fluid in the pelvis. Trace perihepatic free fluid. Musculoskeletal: Degenerative changes of the spine with retrolisthesis of L4 on L5. No acute or suspicious osseous abnormality. IMPRESSION: 1. Cardiomegaly with trace left pleural effusion and small moderate right pleural effusion. No focal airspace consolidation. 2. Moderate emphysema. 3 mm left upper lobe lung nodule. No follow-up needed if patient is low-risk. Non-contrast chest CT can be considered in 12 months if patient is high-risk. This recommendation follows the consensus statement: Guidelines for Management of Incidental Pulmonary Nodules Detected on CT Images: From the Fleischner Society 2017; Radiology 2017; 284:228-243. 3. Small amount of free fluid in the pelvis and adjacent to the liver. 4. Gallbladder sludge versus small stones without acute inflammatory change 5. Scarring and atrophy of right kidney 6. Distal infrarenal abdominal aorta measures up to 2.8 cm. Ectatic abdominal aorta at risk for aneurysm  development. Recommend followup by ultrasound in 5 years. This recommendation follows ACR consensus guidelines: White Paper of the ACR Incidental Findings Committee II on Vascular Findings. J Am Coll Radiol 2013; 10:789-794. Electronically Signed   By: Donavan Foil M.D.   On: 04/30/2019 19:23   Dg Chest 1 View  Result Date: 04/29/2019 CLINICAL DATA:  Shortness of breath. Lower extremity swelling. EXAM: CHEST  1 VIEW COMPARISON:  01/28/2018 FINDINGS: Prior CABG, an ICD, cardiomegaly, and aortic atherosclerosis are again noted. There is central pulmonary vascular congestion without overt edema. No airspace consolidation, sizeable pleural effusion, pneumothorax is identified. No acute osseous abnormality is seen. IMPRESSION: Cardiomegaly and pulmonary vascular congestion. Electronically Signed   By: Seymour Bars.D.  On: 04/29/2019 15:39   Ct Chest Wo Contrast  Result Date: 04/30/2019 CLINICAL DATA:  Heart failure short of breath abdominal fullness EXAM: CT CHEST, ABDOMEN AND PELVIS WITHOUT CONTRAST TECHNIQUE: Multidetector CT imaging of the chest, abdomen and pelvis was performed following the standard protocol without IV contrast. COMPARISON:  Ultrasound 04/30/2019, chest x-ray 04/29/2019 FINDINGS: CT CHEST FINDINGS Cardiovascular: Moderate aortic atherosclerosis without aneurysm. Extensive coronary vascular calcification. Cardiomegaly. No significant pericardial effusion. Post CABG changes. Left-sided cardiac pacing device with single lead tip at the right ventricle. Mediastinum/Nodes: Midline trachea. No thyroid mass. No significant adenopathy. Esophagus shows mild air distention but is otherwise unremarkable Lungs/Pleura: Moderate emphysema. Trace left pleural effusion. Small moderate right pleural effusion. No focal consolidation. 3 mm subpleural left upper lobe lung nodule, series 4, image number 74. Musculoskeletal: Post sternotomy changes. No acute or suspicious osseous lesion CT ABDOMEN PELVIS  FINDINGS Hepatobiliary: No focal hepatic abnormality. Hyperdense sludge or small stones in the gallbladder. No biliary dilatation Pancreas: Unremarkable. No pancreatic ductal dilatation or surrounding inflammatory changes. Spleen: Normal in size without focal abnormality. Adrenals/Urinary Tract: Adrenal glands are normal. No hydronephrosis. Scarring and mild atrophy of the right kidney, most prominent involving the lower pole. The bladder is unremarkable. Stomach/Bowel: Stomach is within normal limits. Appendix appears normal. No evidence of bowel wall thickening, distention, or inflammatory changes. Vascular/Lymphatic: Moderate aortic atherosclerosis. Focal ectasia of the distal infrarenal abdominal aorta measuring up to 2.8 cm. No significantly enlarged lymph nodes Reproductive: Slightly enlarged prostate with scattered calcification Other: No free air. Small amount of free fluid in the pelvis. Trace perihepatic free fluid. Musculoskeletal: Degenerative changes of the spine with retrolisthesis of L4 on L5. No acute or suspicious osseous abnormality. IMPRESSION: 1. Cardiomegaly with trace left pleural effusion and small moderate right pleural effusion. No focal airspace consolidation. 2. Moderate emphysema. 3 mm left upper lobe lung nodule. No follow-up needed if patient is low-risk. Non-contrast chest CT can be considered in 12 months if patient is high-risk. This recommendation follows the consensus statement: Guidelines for Management of Incidental Pulmonary Nodules Detected on CT Images: From the Fleischner Society 2017; Radiology 2017; 284:228-243. 3. Small amount of free fluid in the pelvis and adjacent to the liver. 4. Gallbladder sludge versus small stones without acute inflammatory change 5. Scarring and atrophy of right kidney 6. Distal infrarenal abdominal aorta measures up to 2.8 cm. Ectatic abdominal aorta at risk for aneurysm development. Recommend followup by ultrasound in 5 years. This  recommendation follows ACR consensus guidelines: White Paper of the ACR Incidental Findings Committee II on Vascular Findings. J Am Coll Radiol 2013; 10:789-794. Electronically Signed   By: Donavan Foil M.D.   On: 04/30/2019 19:23   US Renal  Result Date: 04/30/2019 CLINICAL DATA:  Acute kidney injury EXAM: RENAL / URINARY TRACT ULTRASOUND COMPLETE COMPARISON:  01/27/2018 FINDINGS: Right Kidney: Renal measurements: 8.1 x 4.3 x 4.4 cm = volume: 80 mL. Increased echogenicity. No hydronephrosis. No focal lesion. Left Kidney: Renal measurements: 9.2 x 5.5 x 5.3 cm = volume: 141 mL. Echogenicity within normal limits. No mass or hydronephrosis visualized. Bladder: Empty.  Patient recently voided. Right pleural effusion is noted. IMPRESSION: Since the prior study, the right kidney has gotten smaller. This suggests interval vascular insult. There is no evidence hydronephrosis. Left kidney is within normal limits. Electronically Signed   By: Nelson Chimes M.D.   On: 04/30/2019 10:21     CBC Recent Labs  Lab 04/29/19 1451 04/30/19 0416  WBC 8.0 7.7  HGB 17.6* 18.0*  HCT 51.8 52.7*  PLT 132* 129*  MCV 90.6 92.1  MCH 30.8 31.5  MCHC 34.0 34.2  RDW 13.4 13.5  LYMPHSABS  --  1.0  MONOABS  --  1.0  EOSABS  --  0.1  BASOSABS  --  0.0    Chemistries  Recent Labs  Lab 04/29/19 1451 04/30/19 0416 05/01/19 0456  NA 136 137 137  K 3.2* 3.4* 3.7  CL 100 100 99  CO2 22 23 23   GLUCOSE 103* 123* 94  BUN 44* 46* 48*  CREATININE 2.68* 2.73* 2.55*  CALCIUM 9.0 9.1 9.2   ------------------------------------------------------------------------------------------------------------------ estimated creatinine clearance is 28.6 mL/min (A) (by C-G formula based on SCr of 2.55 mg/dL (H)). ------------------------------------------------------------------------------------------------------------------ No results for input(s): HGBA1C in the last 72  hours. ------------------------------------------------------------------------------------------------------------------ No results for input(s): CHOL, HDL, LDLCALC, TRIG, CHOLHDL, LDLDIRECT in the last 72 hours. ------------------------------------------------------------------------------------------------------------------ No results for input(s): TSH, T4TOTAL, T3FREE, THYROIDAB in the last 72 hours.  Invalid input(s): FREET3 ------------------------------------------------------------------------------------------------------------------ No results for input(s): VITAMINB12, FOLATE, FERRITIN, TIBC, IRON, RETICCTPCT in the last 72 hours.  Coagulation profile Recent Labs  Lab 04/29/19 2318  INR 1.3*    No results for input(s): DDIMER in the last 72 hours.  Cardiac Enzymes Recent Labs  Lab 04/29/19 2205 04/30/19 0416 04/30/19 0954  TROPONINI 7.69* 0.14* 0.14*   ------------------------------------------------------------------------------------------------------------------ Invalid input(s): POCBNP    Assessment & Plan  Patient 68 year old presenting with shortness of breath    Acute on chronic combined systolic and diastolic congestive heart failure with AICD in place- last ECHO in 2019 with EF 20-25% and G1DD.  Follows with Dr. Clayborn Bigness as an outpatient. -Continue IV Lasix  Acute COPD exasperation new diagnosis for this patient we will start patient on nebulizer steroids Will need outpatient pulmonary follow-up and PFTs   Abdominal distention no abnormality noted on CT of the abdomen  AKI on chronic kidney disease Nephrology following I will hold Lasix   Elevated troponin with a history of CAD s/p CABG- likely secondary to demand ischemia in the setting of CHF exacerbation and AKI.  No active chest pain. -There was one troponin out of 4 which was 7.69 this appears to be a lab error  Paroxysmal atrial fibrillation- mildly tachycardic here -Not on  anticoagulation due to history of muscle hematoma -Continue home amiodarone  Hypokalemia-likely due to diuretics Status post replacement       Code Status Orders  (From admission, onward)         Start     Ordered   04/29/19 2201  Full code  Continuous     04/29/19 2200        Code Status History    Date Active Date Inactive Code Status Order ID Comments User Context   01/23/2018 1259 01/31/2018 1424 Full Code [id number]  Gorden Harms, MD Inpatient   01/16/2017 1521 01/19/2017 1503 Full Code [id number]  Vaughan Basta, MD Inpatient   02/25/2016 0058 02/25/2016 2016 Full Code [id number]  Lance Coon, MD ED           Consults  Nephrology, cardiology  DVT Prophylaxis  Lovenox   Lab Results  Component Value Date   PLT 129 (L) 04/30/2019     Time Spent in minutes   41min  Greater than 50% of time spent in care coordination and counseling patient regarding the condition and plan of care.   Dustin Flock M.D on 05/01/2019 at 2:21 PM  Between 7am to 6pm - Pager -  [phone]  After 6pm go to [email or web address] - Proofreader  [employer]   Office  [phone]

## 2019-05-01 NOTE — Progress Notes (Signed)
Patient Name: Chris Nguyen Date of Encounter: 05/01/2019  Hospital Problem List     Active Problems:   Acute on chronic combined systolic and diastolic CHF (congestive heart failure) [facility])    Patient Profile     68 year old male with history of cardiomyopathy with AICD in place.  Admitted with shortness of breath.  Markedly improved with bronchodilators this morning.  Pulse ox 98% on room air.  Echo showed persistent reduced LV function with heart failure with reduced ejection fraction at 20%.  Subjective   Less short of breath  Inpatient Medications    . amiodarone  200 mg Oral BID  . aspirin EC  81 mg Oral Daily  . budesonide (PULMICORT) nebulizer solution  0.25 mg Nebulization BID  . furosemide  40 mg Intravenous BID  . heparin injection (subcutaneous)  5,000 Units Subcutaneous Q8H  . levalbuterol  1.25 mg Nebulization Q6H  . methylPREDNISolone (SOLU-MEDROL) injection  60 mg Intravenous Q6H  . sodium chloride flush  3 mL Intravenous Q12H    Vital Signs    Vitals:   04/30/19 2355 05/01/19 0015 05/01/19 0432 05/01/19 0716  BP: 126/88  (!) 116/93 139/87  Pulse: (!) 56 (!) 110 (!) 109 (!) 109  Resp: (!) 24 20 18 19   Temp: 97.7 F (36.5 C)  98.2 F (36.8 C) 97.9 F (36.6 C)  TempSrc: Oral  Oral Oral  SpO2: 99% 100% 90% 98%  Weight:   83.5 kg   Height:        Intake/Output Summary (Last 24 hours) at 05/01/2019 1306 Last data filed at 05/01/2019 0845 Gross per 24 hour  Intake -  Output 410 ml  Net -410 ml   Filed Weights   04/29/19 2201 04/30/19 0523 05/01/19 0432  Weight: 83.8 kg 83.3 kg 83.5 kg    Physical Exam    GEN: Well nourished, well developed, in no acute distress.  HEENT: normal.  Neck: Supple, no JVD, carotid bruits, or masses. Cardiac: RRR, no murmurs, rubs, or gallops. No clubbing, cyanosis, edema.  Radials/DP/PT 2+ and equal bilaterally.  Respiratory:  Respirations regular and unlabored, clear to auscultation bilaterally. GI: Soft,  nontender, nondistended, BS + x 4. MS: no deformity or atrophy. Skin: warm and dry, no rash. Neuro:  Strength and sensation are intact. Psych: Normal affect.  Labs    CBC Recent Labs    04/29/19 1451 04/30/19 0416  WBC 8.0 7.7  NEUTROABS  --  5.6  HGB 17.6* 18.0*  HCT 51.8 52.7*  MCV 90.6 92.1  PLT 132* 073*   Basic Metabolic Panel Recent Labs    04/30/19 0416 05/01/19 0456  NA 137 137  K 3.4* 3.7  CL 100 99  CO2 23 23  GLUCOSE 123* 94  BUN 46* 48*  CREATININE 2.73* 2.55*  CALCIUM 9.1 9.2   Liver Function Tests No results for input(s): AST, ALT, ALKPHOS, BILITOT, PROT, ALBUMIN in the last 72 hours. No results for input(s): LIPASE, AMYLASE in the last 72 hours. Cardiac Enzymes Recent Labs    04/29/19 2205 04/30/19 0416 04/30/19 0954  TROPONINI 7.69* 0.14* 0.14*   BNP Recent Labs    04/29/19 1451  BNP 457.0*   D-Dimer No results for input(s): DDIMER in the last 72 hours. Hemoglobin A1C No results for input(s): HGBA1C in the last 72 hours. Fasting Lipid Panel No results for input(s): CHOL, HDL, LDLCALC, TRIG, CHOLHDL, LDLDIRECT in the last 72 hours. Thyroid Function Tests No results for input(s): TSH, T4TOTAL, T3FREE,  THYROIDAB in the last 72 hours.  Invalid input(s): FREET3  Telemetry    Sinus tachycardia  ECG    Sinus tachycardia  Radiology    Ct Abdomen Pelvis Wo Contrast  Result Date: 04/30/2019 CLINICAL DATA:  Heart failure short of breath abdominal fullness EXAM: CT CHEST, ABDOMEN AND PELVIS WITHOUT CONTRAST TECHNIQUE: Multidetector CT imaging of the chest, abdomen and pelvis was performed following the standard protocol without IV contrast. COMPARISON:  Ultrasound 04/30/2019, chest x-ray 04/29/2019 FINDINGS: CT CHEST FINDINGS Cardiovascular: Moderate aortic atherosclerosis without aneurysm. Extensive coronary vascular calcification. Cardiomegaly. No significant pericardial effusion. Post CABG changes. Left-sided cardiac pacing device with  single lead tip at the right ventricle. Mediastinum/Nodes: Midline trachea. No thyroid mass. No significant adenopathy. Esophagus shows mild air distention but is otherwise unremarkable Lungs/Pleura: Moderate emphysema. Trace left pleural effusion. Small moderate right pleural effusion. No focal consolidation. 3 mm subpleural left upper lobe lung nodule, series 4, image number 74. Musculoskeletal: Post sternotomy changes. No acute or suspicious osseous lesion CT ABDOMEN PELVIS FINDINGS Hepatobiliary: No focal hepatic abnormality. Hyperdense sludge or small stones in the gallbladder. No biliary dilatation Pancreas: Unremarkable. No pancreatic ductal dilatation or surrounding inflammatory changes. Spleen: Normal in size without focal abnormality. Adrenals/Urinary Tract: Adrenal glands are normal. No hydronephrosis. Scarring and mild atrophy of the right kidney, most prominent involving the lower pole. The bladder is unremarkable. Stomach/Bowel: Stomach is within normal limits. Appendix appears normal. No evidence of bowel wall thickening, distention, or inflammatory changes. Vascular/Lymphatic: Moderate aortic atherosclerosis. Focal ectasia of the distal infrarenal abdominal aorta measuring up to 2.8 cm. No significantly enlarged lymph nodes Reproductive: Slightly enlarged prostate with scattered calcification Other: No free air. Small amount of free fluid in the pelvis. Trace perihepatic free fluid. Musculoskeletal: Degenerative changes of the spine with retrolisthesis of L4 on L5. No acute or suspicious osseous abnormality. IMPRESSION: 1. Cardiomegaly with trace left pleural effusion and small moderate right pleural effusion. No focal airspace consolidation. 2. Moderate emphysema. 3 mm left upper lobe lung nodule. No follow-up needed if patient is low-risk. Non-contrast chest CT can be considered in 12 months if patient is high-risk. This recommendation follows the consensus statement: Guidelines for Management of  Incidental Pulmonary Nodules Detected on CT Images: From the Fleischner Society 2017; Radiology 2017; 284:228-243. 3. Small amount of free fluid in the pelvis and adjacent to the liver. 4. Gallbladder sludge versus small stones without acute inflammatory change 5. Scarring and atrophy of right kidney 6. Distal infrarenal abdominal aorta measures up to 2.8 cm. Ectatic abdominal aorta at risk for aneurysm development. Recommend followup by ultrasound in 5 years. This recommendation follows ACR consensus guidelines: White Paper of the ACR Incidental Findings Committee II on Vascular Findings. J Am Coll Radiol 2013; 10:789-794. Electronically Signed   By: Donavan Foil M.D.   On: 04/30/2019 19:23   Dg Chest 1 View  Result Date: 04/29/2019 CLINICAL DATA:  Shortness of breath. Lower extremity swelling. EXAM: CHEST  1 VIEW COMPARISON:  01/28/2018 FINDINGS: Prior CABG, an ICD, cardiomegaly, and aortic atherosclerosis are again noted. There is central pulmonary vascular congestion without overt edema. No airspace consolidation, sizeable pleural effusion, pneumothorax is identified. No acute osseous abnormality is seen. IMPRESSION: Cardiomegaly and pulmonary vascular congestion. Electronically Signed   By: Logan Bores M.D.   On: 04/29/2019 15:39   Ct Chest Wo Contrast  Result Date: 04/30/2019 CLINICAL DATA:  Heart failure short of breath abdominal fullness EXAM: CT CHEST, ABDOMEN AND PELVIS WITHOUT CONTRAST TECHNIQUE: Multidetector CT  imaging of the chest, abdomen and pelvis was performed following the standard protocol without IV contrast. COMPARISON:  Ultrasound 04/30/2019, chest x-ray 04/29/2019 FINDINGS: CT CHEST FINDINGS Cardiovascular: Moderate aortic atherosclerosis without aneurysm. Extensive coronary vascular calcification. Cardiomegaly. No significant pericardial effusion. Post CABG changes. Left-sided cardiac pacing device with single lead tip at the right ventricle. Mediastinum/Nodes: Midline trachea. No  thyroid mass. No significant adenopathy. Esophagus shows mild air distention but is otherwise unremarkable Lungs/Pleura: Moderate emphysema. Trace left pleural effusion. Small moderate right pleural effusion. No focal consolidation. 3 mm subpleural left upper lobe lung nodule, series 4, image number 74. Musculoskeletal: Post sternotomy changes. No acute or suspicious osseous lesion CT ABDOMEN PELVIS FINDINGS Hepatobiliary: No focal hepatic abnormality. Hyperdense sludge or small stones in the gallbladder. No biliary dilatation Pancreas: Unremarkable. No pancreatic ductal dilatation or surrounding inflammatory changes. Spleen: Normal in size without focal abnormality. Adrenals/Urinary Tract: Adrenal glands are normal. No hydronephrosis. Scarring and mild atrophy of the right kidney, most prominent involving the lower pole. The bladder is unremarkable. Stomach/Bowel: Stomach is within normal limits. Appendix appears normal. No evidence of bowel wall thickening, distention, or inflammatory changes. Vascular/Lymphatic: Moderate aortic atherosclerosis. Focal ectasia of the distal infrarenal abdominal aorta measuring up to 2.8 cm. No significantly enlarged lymph nodes Reproductive: Slightly enlarged prostate with scattered calcification Other: No free air. Small amount of free fluid in the pelvis. Trace perihepatic free fluid. Musculoskeletal: Degenerative changes of the spine with retrolisthesis of L4 on L5. No acute or suspicious osseous abnormality. IMPRESSION: 1. Cardiomegaly with trace left pleural effusion and small moderate right pleural effusion. No focal airspace consolidation. 2. Moderate emphysema. 3 mm left upper lobe lung nodule. No follow-up needed if patient is low-risk. Non-contrast chest CT can be considered in 12 months if patient is high-risk. This recommendation follows the consensus statement: Guidelines for Management of Incidental Pulmonary Nodules Detected on CT Images: From the Fleischner Society  2017; Radiology 2017; 284:228-243. 3. Small amount of free fluid in the pelvis and adjacent to the liver. 4. Gallbladder sludge versus small stones without acute inflammatory change 5. Scarring and atrophy of right kidney 6. Distal infrarenal abdominal aorta measures up to 2.8 cm. Ectatic abdominal aorta at risk for aneurysm development. Recommend followup by ultrasound in 5 years. This recommendation follows ACR consensus guidelines: White Paper of the ACR Incidental Findings Committee II on Vascular Findings. J Am Coll Radiol 2013; 10:789-794. Electronically Signed   By: Donavan Foil M.D.   On: 04/30/2019 19:23   US Renal  Result Date: 04/30/2019 CLINICAL DATA:  Acute kidney injury EXAM: RENAL / URINARY TRACT ULTRASOUND COMPLETE COMPARISON:  01/27/2018 FINDINGS: Right Kidney: Renal measurements: 8.1 x 4.3 x 4.4 cm = volume: 80 mL. Increased echogenicity. No hydronephrosis. No focal lesion. Left Kidney: Renal measurements: 9.2 x 5.5 x 5.3 cm = volume: 141 mL. Echogenicity within normal limits. No mass or hydronephrosis visualized. Bladder: Empty.  Patient recently voided. Right pleural effusion is noted. IMPRESSION: Since the prior study, the right kidney has gotten smaller. This suggests interval vascular insult. There is no evidence hydronephrosis. Left kidney is within normal limits. Electronically Signed   By: Nelson Chimes M.D.   On: 04/30/2019 10:21    Assessment & Plan    Cardiomyopathy-echo shows persisted EF 20%.  Appears hemodynamically stable at present and back to baseline.  Respiratory status improved with bronchodilators.  We will continue with this as needed.  Would ambulate and consider discharge with outpatient follow-up with Dr. Clayborn Bigness.  Patient  appears to be at baseline on current medicine.  Signed, Javier Docker Kagan Mutchler MD 05/01/2019, 1:06 PM  Pager: [phone]

## 2019-05-01 NOTE — Progress Notes (Signed)
[facility]  ROUNDING NOTE   Subjective:   Shortness of breath this morning. Received a/a nebs.   Feels better. Sitting in chair. Denies any peripheral edema  Objective:  Vital signs in last 24 hours:  Temp:  [97.7 F (36.5 C)-98.2 F (36.8 C)] 97.9 F (36.6 C) (05/31 0716) Pulse Rate:  [56-111] 109 (05/31 0716) Resp:  [18-24] 19 (05/31 0716) BP: (116-139)/(87-96) 139/87 (05/31 0716) SpO2:  [90 %-100 %] 98 % (05/31 0716) Weight:  [83.5 kg] 83.5 kg (05/31 0432)  Weight change: 1.814 kg Filed Weights   04/29/19 2201 04/30/19 0523 05/01/19 0432  Weight: 83.8 kg 83.3 kg 83.5 kg    Intake/Output: I/O last 3 completed shifts: In: -  Out: 1055 [Urine:1055]   Intake/Output this shift:  Total I/O In: -  Out: 180 [Urine:180]  Physical Exam: General: NAD,   Head: Normocephalic, atraumatic. Moist oral mucosal membranes  Eyes: Anicteric, PERRL  Neck: Supple, trachea midline  Lungs:  Clear to auscultation  Heart: irregular  Abdomen:  Soft, nontender,   Extremities: no peripheral edema.  Neurologic: Nonfocal, moving all four extremities  Skin: No lesions    Basic Metabolic Panel: Recent Labs  Lab 04/29/19 1451 04/30/19 0416 05/01/19 0456  NA 136 137 137  K 3.2* 3.4* 3.7  CL 100 100 99  CO2 22 23 23   GLUCOSE 103* 123* 94  BUN 44* 46* 48*  CREATININE 2.68* 2.73* 2.55*  CALCIUM 9.0 9.1 9.2    Liver Function Tests: No results for input(s): AST, ALT, ALKPHOS, BILITOT, PROT, ALBUMIN in the last 168 hours. No results for input(s): LIPASE, AMYLASE in the last 168 hours. No results for input(s): AMMONIA in the last 168 hours.  CBC: Recent Labs  Lab 04/29/19 1451 04/30/19 0416  WBC 8.0 7.7  NEUTROABS  --  5.6  HGB 17.6* 18.0*  HCT 51.8 52.7*  MCV 90.6 92.1  PLT 132* 129*    Cardiac Enzymes: Recent Labs  Lab 04/29/19 1451 04/29/19 2205 04/30/19 0416 04/30/19 0954  TROPONINI 0.15* 7.69* 0.14* 0.14*    BNP: Invalid input(s):  POCBNP  CBG: No results for input(s): GLUCAP in the last 168 hours.  Microbiology: Results for orders placed or performed during the hospital encounter of 04/29/19  SARS Coronavirus 2 (CEPHEID - Performed in [facility] hospital lab), Hosp Order     Status: None   Collection Time: 04/29/19  4:00 PM  Result Value Ref Range Status   SARS Coronavirus 2 NEGATIVE NEGATIVE Final    Comment: (NOTE) If result is NEGATIVE SARS-CoV-2 target nucleic acids are NOT DETECTED. The SARS-CoV-2 RNA is generally detectable in upper and lower  respiratory specimens during the acute phase of infection. The lowest  concentration of SARS-CoV-2 viral copies this assay can detect is 250  copies / mL. A negative result does not preclude SARS-CoV-2 infection  and should not be used as the sole basis for treatment or other  patient management decisions.  A negative result may occur with  improper specimen collection / handling, submission of specimen other  than nasopharyngeal swab, presence of viral mutation(s) within the  areas targeted by this assay, and inadequate number of viral copies  (<250 copies / mL). A negative result must be combined with clinical  observations, patient history, and epidemiological information. If result is POSITIVE SARS-CoV-2 target nucleic acids are DETECTED. The SARS-CoV-2 RNA is generally detectable in upper and lower  respiratory specimens dur ing the acute phase of infection.  Positive  results are indicative of active infection with SARS-CoV-2.  Clinical  correlation with patient history and other diagnostic information is  necessary to determine patient infection status.  Positive results do  not rule out bacterial infection or co-infection with other viruses. If result is PRESUMPTIVE POSTIVE SARS-CoV-2 nucleic acids MAY BE PRESENT.   A presumptive positive result was obtained on the submitted specimen  and confirmed on repeat testing.  While 2019 novel coronavirus   (SARS-CoV-2) nucleic acids may be present in the submitted sample  additional confirmatory testing may be necessary for epidemiological  and / or clinical management purposes  to differentiate between  SARS-CoV-2 and other Sarbecovirus currently known to infect humans.  If clinically indicated additional testing with an alternate test  methodology [id number]) is advised. The SARS-CoV-2 RNA is generally  detectable in upper and lower respiratory sp ecimens during the acute  phase of infection. The expected result is Negative. Fact Sheet for Patients:  StrictlyIdeas.no Fact Sheet for Healthcare Providers: BankingDealers.co.za This test is not yet approved or cleared by the [place] FDA and has been authorized for detection and/or diagnosis of SARS-CoV-2 by FDA under an Emergency Use Authorization (EUA).  This EUA will remain in effect (meaning this test can be used) for the duration of the COVID-19 declaration under Section 564(b)(1) of the Act, 21 U.S.C. section 360bbb-3(b)(1), unless the authorization is terminated or revoked sooner. Performed at [facility], [address] [place], [place] [address]     Coagulation Studies: Recent Labs    04/29/19 2318  LABPROT 16.4*  INR 1.3*    Urinalysis: No results for input(s): COLORURINE, LABSPEC, PHURINE, GLUCOSEU, HGBUR, BILIRUBINUR, KETONESUR, PROTEINUR, UROBILINOGEN, NITRITE, LEUKOCYTESUR in the last 72 hours.  Invalid input(s): APPERANCEUR    Imaging: Ct Abdomen Pelvis Wo Contrast  Result Date: 04/30/2019 CLINICAL DATA:  Heart failure short of breath abdominal fullness EXAM: CT CHEST, ABDOMEN AND PELVIS WITHOUT CONTRAST TECHNIQUE: Multidetector CT imaging of the chest, abdomen and pelvis was performed following the standard protocol without IV contrast. COMPARISON:  Ultrasound 04/30/2019, chest x-ray 04/29/2019 FINDINGS: CT CHEST FINDINGS Cardiovascular: Moderate  aortic atherosclerosis without aneurysm. Extensive coronary vascular calcification. Cardiomegaly. No significant pericardial effusion. Post CABG changes. Left-sided cardiac pacing device with single lead tip at the right ventricle. Mediastinum/Nodes: Midline trachea. No thyroid mass. No significant adenopathy. Esophagus shows mild air distention but is otherwise unremarkable Lungs/Pleura: Moderate emphysema. Trace left pleural effusion. Small moderate right pleural effusion. No focal consolidation. 3 mm subpleural left upper lobe lung nodule, series 4, image number 74. Musculoskeletal: Post sternotomy changes. No acute or suspicious osseous lesion CT ABDOMEN PELVIS FINDINGS Hepatobiliary: No focal hepatic abnormality. Hyperdense sludge or small stones in the gallbladder. No biliary dilatation Pancreas: Unremarkable. No pancreatic ductal dilatation or surrounding inflammatory changes. Spleen: Normal in size without focal abnormality. Adrenals/Urinary Tract: Adrenal glands are normal. No hydronephrosis. Scarring and mild atrophy of the right kidney, most prominent involving the lower pole. The bladder is unremarkable. Stomach/Bowel: Stomach is within normal limits. Appendix appears normal. No evidence of bowel wall thickening, distention, or inflammatory changes. Vascular/Lymphatic: Moderate aortic atherosclerosis. Focal ectasia of the distal infrarenal abdominal aorta measuring up to 2.8 cm. No significantly enlarged lymph nodes Reproductive: Slightly enlarged prostate with scattered calcification Other: No free air. Small amount of free fluid in the pelvis. Trace perihepatic free fluid. Musculoskeletal: Degenerative changes of the spine with retrolisthesis of L4 on L5. No acute or suspicious osseous abnormality. IMPRESSION: 1. Cardiomegaly with trace left pleural effusion  and small moderate right pleural effusion. No focal airspace consolidation. 2. Moderate emphysema. 3 mm left upper lobe lung nodule. No follow-up  needed if patient is low-risk. Non-contrast chest CT can be considered in 12 months if patient is high-risk. This recommendation follows the consensus statement: Guidelines for Management of Incidental Pulmonary Nodules Detected on CT Images: From the Fleischner Society 2017; Radiology 2017; 284:228-243. 3. Small amount of free fluid in the pelvis and adjacent to the liver. 4. Gallbladder sludge versus small stones without acute inflammatory change 5. Scarring and atrophy of right kidney 6. Distal infrarenal abdominal aorta measures up to 2.8 cm. Ectatic abdominal aorta at risk for aneurysm development. Recommend followup by ultrasound in 5 years. This recommendation follows ACR consensus guidelines: White Paper of the ACR Incidental Findings Committee II on Vascular Findings. J Am Coll Radiol 2013; 10:789-794. Electronically Signed   By: Donavan Foil M.D.   On: 04/30/2019 19:23   Dg Chest 1 View  Result Date: 04/29/2019 CLINICAL DATA:  Shortness of breath. Lower extremity swelling. EXAM: CHEST  1 VIEW COMPARISON:  01/28/2018 FINDINGS: Prior CABG, an ICD, cardiomegaly, and aortic atherosclerosis are again noted. There is central pulmonary vascular congestion without overt edema. No airspace consolidation, sizeable pleural effusion, pneumothorax is identified. No acute osseous abnormality is seen. IMPRESSION: Cardiomegaly and pulmonary vascular congestion. Electronically Signed   By: Logan Bores M.D.   On: 04/29/2019 15:39   Ct Chest Wo Contrast  Result Date: 04/30/2019 CLINICAL DATA:  Heart failure short of breath abdominal fullness EXAM: CT CHEST, ABDOMEN AND PELVIS WITHOUT CONTRAST TECHNIQUE: Multidetector CT imaging of the chest, abdomen and pelvis was performed following the standard protocol without IV contrast. COMPARISON:  Ultrasound 04/30/2019, chest x-ray 04/29/2019 FINDINGS: CT CHEST FINDINGS Cardiovascular: Moderate aortic atherosclerosis without aneurysm. Extensive coronary vascular  calcification. Cardiomegaly. No significant pericardial effusion. Post CABG changes. Left-sided cardiac pacing device with single lead tip at the right ventricle. Mediastinum/Nodes: Midline trachea. No thyroid mass. No significant adenopathy. Esophagus shows mild air distention but is otherwise unremarkable Lungs/Pleura: Moderate emphysema. Trace left pleural effusion. Small moderate right pleural effusion. No focal consolidation. 3 mm subpleural left upper lobe lung nodule, series 4, image number 74. Musculoskeletal: Post sternotomy changes. No acute or suspicious osseous lesion CT ABDOMEN PELVIS FINDINGS Hepatobiliary: No focal hepatic abnormality. Hyperdense sludge or small stones in the gallbladder. No biliary dilatation Pancreas: Unremarkable. No pancreatic ductal dilatation or surrounding inflammatory changes. Spleen: Normal in size without focal abnormality. Adrenals/Urinary Tract: Adrenal glands are normal. No hydronephrosis. Scarring and mild atrophy of the right kidney, most prominent involving the lower pole. The bladder is unremarkable. Stomach/Bowel: Stomach is within normal limits. Appendix appears normal. No evidence of bowel wall thickening, distention, or inflammatory changes. Vascular/Lymphatic: Moderate aortic atherosclerosis. Focal ectasia of the distal infrarenal abdominal aorta measuring up to 2.8 cm. No significantly enlarged lymph nodes Reproductive: Slightly enlarged prostate with scattered calcification Other: No free air. Small amount of free fluid in the pelvis. Trace perihepatic free fluid. Musculoskeletal: Degenerative changes of the spine with retrolisthesis of L4 on L5. No acute or suspicious osseous abnormality. IMPRESSION: 1. Cardiomegaly with trace left pleural effusion and small moderate right pleural effusion. No focal airspace consolidation. 2. Moderate emphysema. 3 mm left upper lobe lung nodule. No follow-up needed if patient is low-risk. Non-contrast chest CT can be  considered in 12 months if patient is high-risk. This recommendation follows the consensus statement: Guidelines for Management of Incidental Pulmonary Nodules Detected on CT Images: From  the Fleischner Society 2017; Radiology 2017; [phone]. 3. Small amount of free fluid in the pelvis and adjacent to the liver. 4. Gallbladder sludge versus small stones without acute inflammatory change 5. Scarring and atrophy of right kidney 6. Distal infrarenal abdominal aorta measures up to 2.8 cm. Ectatic abdominal aorta at risk for aneurysm development. Recommend followup by ultrasound in 5 years. This recommendation follows ACR consensus guidelines: White Paper of the ACR Incidental Findings Committee II on Vascular Findings. J Am Coll Radiol 2013; 10:789-794. Electronically Signed   By: Donavan Foil M.D.   On: 04/30/2019 19:23   US Renal  Result Date: 04/30/2019 CLINICAL DATA:  Acute kidney injury EXAM: RENAL / URINARY TRACT ULTRASOUND COMPLETE COMPARISON:  01/27/2018 FINDINGS: Right Kidney: Renal measurements: 8.1 x 4.3 x 4.4 cm = volume: 80 mL. Increased echogenicity. No hydronephrosis. No focal lesion. Left Kidney: Renal measurements: 9.2 x 5.5 x 5.3 cm = volume: 141 mL. Echogenicity within normal limits. No mass or hydronephrosis visualized. Bladder: Empty.  Patient recently voided. Right pleural effusion is noted. IMPRESSION: Since the prior study, the right kidney has gotten smaller. This suggests interval vascular insult. There is no evidence hydronephrosis. Left kidney is within normal limits. Electronically Signed   By: Nelson Chimes M.D.   On: 04/30/2019 10:21     Medications:   . sodium chloride     . amiodarone  200 mg Oral BID  . aspirin EC  81 mg Oral Daily  . budesonide (PULMICORT) nebulizer solution  0.25 mg Nebulization BID  . furosemide  40 mg Oral BID  . heparin injection (subcutaneous)  5,000 Units Subcutaneous Q8H  . levalbuterol  1.25 mg Nebulization Q6H  . methylPREDNISolone  (SOLU-MEDROL) injection  60 mg Intravenous Q6H  . sodium chloride flush  3 mL Intravenous Q12H   sodium chloride, acetaminophen, alum & mag hydroxide-simeth, ondansetron (ZOFRAN) IV, sodium chloride flush, traMADol  Assessment/ Plan:  Mr. DAYSON ABOUD is a 68 y.o. black male with hypertension, congestive heart failure, atrial fibrillation, hyperlipidemia, coronary artery disease status post CABG, AICD, BPH, mitral valve repair, who was [facility] to [facility] on[facility]/29[facility]2020 for SOB (shortness of breath) [R06.02] Elevated troponin [R79.89] Acute on chronic congestive heart failure, unspecified heart failure type ([place]) [I50.9]  1. Acute renal failure on chronic kidney disease stage III: baseline creatinine of 1.53, GFR of 54 on 04/13/18 With proteinuria Acute renal failure due to acute cardiorenal syndrome Chronic kidney disease secondary to hypertension  2. Hypertension  3. Acute exacerbation of systolic and diastolic congestive heart failure. EF 25%  4. Acute exacerbation of COPD  Plan - Continue  furosemide -changed to PO today - Fluid restriction - New echocardiogram reviewed with patient     LOS: 2 Chris Nguyen 5/31/20203:07 PM

## 2019-05-01 NOTE — Plan of Care (Signed)
Dyspnea with minimal exertion continues. Oxygen saturations WNL.

## 2019-05-01 NOTE — Progress Notes (Signed)
Pt complains of SOB. Lungs clear, oxygen saturation is 95% on room air, 24 resp a minute. RN gives breathing treatment and scheduled lasix. I will continue to assess.

## 2019-05-01 NOTE — Progress Notes (Signed)
Pt complains of indigestion. Orders for maalox received. I will continue to assess.

## 2019-05-01 NOTE — Consult Note (Signed)
Pulmonary Medicine          Date: 05/01/2019,   MRN# [id number] Chris Nguyen Sep 01, 1951     AdmissionWeight: 81.6 kg                 CurrentWeight: 83.5 kg      CHIEF COMPLAINT:      HISTORY OF PRESENT ILLNESS   This is a 68 year old male with a history of BPH, CAD, CHF EF of 20% with AICD pacemaker, erectile dysfunction, history of coronary artery bypass, history of dyslipidemia, history of atrial fibrillation who was in his usual state of health until last couple of weeks when he noted worsening shortness of breath and progressive swelling of his lower extremities bilaterally.  Patient has a significant history of smoking he started smoking at age of 39 and was smoking 1.5 packs most of his life as well as marijuana which he actively smokes now.  He stopped smoking cigarettes in 2006 after CABG.  He denies having flulike symptoms, sick contacts, febrile episodes, but does report nocturnal diaphoresis as well as some weight loss over the past year.  PAST MEDICAL HISTORY   Past Medical History:  Diagnosis Date   AICD (automatic cardioverter/defibrillator) present    BPH (benign prostatic hyperplasia)    CAD (coronary artery disease) 05/14/2006   Cardiac arrest ([place]) 11/12/2013   Overview:  December 2014: ICD implanted  Last Assessment & Plan:  Relevant Hx: Course: Daily Update: Today's Plan:    Chronic systolic CHF (congestive heart failure) (HCC)    Erectile dysfunction    H/O coronary artery bypass surgery 05/17/2015   Overview:  LIMA to LAD    HLD (hyperlipidemia)    Osteoarthritis of left wrist    Paroxysmal atrial fibrillation (HCC)      SURGICAL HISTORY   Past Surgical History:  Procedure Laterality Date   CARDIAC CATHETERIZATION  06/27/2008   CARPAL TUNNEL RELEASE     CORONARY ARTERY BYPASS GRAFT  2006 and 2014   LIMA to LAD at [facility] 2014   MITRAL VALVE REPAIR       FAMILY HISTORY   Family History  Problem Relation Age of Onset     CAD Mother 69   Diabetes Mother        Diabetes Mellitus   Dementia Father    Glaucoma Father    Heart disease Other      SOCIAL HISTORY   Social History   Tobacco Use   Smoking status: Former Smoker    Last attempt to quit: 12/01/2004    Years since quitting: 14.4   Smokeless tobacco: Never Used  Substance Use Topics   Alcohol use: Yes    Alcohol/week: 7.0 - 9.0 standard drinks    Types: 1 Glasses of wine, 6 - 8 Cans of beer per week   Drug use: No     MEDICATIONS    Home Medication:    Current Medication:  Current Facility-Administered Medications:    0.9 %  sodium chloride infusion, 250 mL, Intravenous, PRN, Mayo, Pete Pelt, MD   acetaminophen (TYLENOL) tablet 650 mg, 650 mg, Oral, Q4H PRN, Mayo, Pete Pelt, MD   amiodarone (PACERONE) tablet 200 mg, 200 mg, Oral, BID, Mayo, Pete Pelt, MD, 200 mg at 05/01/19 6599   aspirin EC tablet 81 mg, 81 mg, Oral, Daily, Mayo, Pete Pelt, MD, 81 mg at 05/01/19 0826   budesonide (PULMICORT) nebulizer solution 0.25 mg, 0.25 mg, Nebulization, BID, Dustin Flock, MD, 0.25 mg  at 05/01/19 0905   furosemide (LASIX) injection 40 mg, 40 mg, Intravenous, BID, Mayo, Pete Pelt, MD, 40 mg at 05/01/19 0827   heparin injection 5,000 Units, 5,000 Units, Subcutaneous, Q8H, Dustin Flock, MD   levalbuterol Penne Lash) nebulizer solution 1.25 mg, 1.25 mg, Nebulization, Q6H, Dustin Flock, MD, 1.25 mg at 05/01/19 0905   methylPREDNISolone sodium succinate (SOLU-MEDROL) 125 mg/2 mL injection 60 mg, 60 mg, Intravenous, Q6H, Dustin Flock, MD   ondansetron (ZOFRAN) injection 4 mg, 4 mg, Intravenous, Q6H PRN, Mayo, Pete Pelt, MD   sodium chloride flush (NS) 0.9 % injection 3 mL, 3 mL, Intravenous, Q12H, Mayo, Pete Pelt, MD, 3 mL at 05/01/19 0833   sodium chloride flush (NS) 0.9 % injection 3 mL, 3 mL, Intravenous, PRN, Mayo, Pete Pelt, MD   traMADol Veatrice Bourbon) tablet 100 mg, 100 mg, Oral, Q12H PRN, Mayo, Pete Pelt,  MD    ALLERGIES   Spironolactone; Entresto [sacubitril-valsartan]; and Ziac [bisoprolol-hydrochlorothiazide]     REVIEW OF SYSTEMS    Review of Systems:  Gen:  Denies  fever, sweats, chills weigh loss  HEENT: Denies blurred vision, double vision, ear pain, eye pain, hearing loss, nose bleeds, sore throat Cardiac:  No dizziness, chest pain or heaviness, chest tightness,edema Resp:   Reports significant shortness of breath with minimal exertion Gi: Denies swallowing difficulty, stomach pain, nausea or vomiting, diarrhea, constipation, bowel incontinence Gu:  Denies bladder incontinence, burning urine Ext:   Denies Joint pain, stiffness or swelling Skin: Denies  skin rash, easy bruising or bleeding or hives Endoc:  Denies polyuria, polydipsia , polyphagia or weight change Psych:   Denies depression, insomnia or hallucinations   Other:  All other systems negative   VS: BP 139/87 (BP Location: Right Arm)    Pulse (!) 109    Temp 97.9 F (36.6 C) (Oral)    Resp 19    Ht 5\' 10"  (1.778 m)    Wt 83.5 kg    SpO2 98%    BMI 26.40 kg/m      PHYSICAL EXAM    GENERAL:NAD, no fevers, chills, no weakness no fatigue HEAD: Normocephalic, atraumatic.  EYES: Pupils equal, round, reactive to light. Extraocular muscles intact. No scleral icterus.  MOUTH: Moist mucosal membrane. Dentition intact. No abscess noted.  EAR, NOSE, THROAT: Clear without exudates. No external lesions.  NECK: Supple. No thyromegaly. No nodules. No JVD.  PULMONARY: Mild crackles at the bases bilaterally without wheezing or rhonchorous breath sounds  cARDIOVASCULAR: S1 and S2. Regular rate and rhythm. No murmurs, rubs, or gallops. No edema. Pedal pulses 2+ bilaterally.  GASTROINTESTINAL: Soft, nontender, nondistended. No masses. Positive bowel sounds. No hepatosplenomegaly.  MUSCULOSKELETAL: No swelling, clubbing, or edema. Range of motion full in all extremities.  NEUROLOGIC: Cranial nerves II through XII are  intact. No gross focal neurological deficits. Sensation intact. Reflexes intact.  SKIN: No ulceration, lesions, rashes, or cyanosis. Skin warm and dry. Turgor intact.  PSYCHIATRIC: Mood, affect within normal limits. The patient is awake, alert and oriented x 3. Insight, judgment intact.       IMAGING    Ct Abdomen Pelvis Wo Contrast  Result Date: 04/30/2019 CLINICAL DATA:  Heart failure short of breath abdominal fullness EXAM: CT CHEST, ABDOMEN AND PELVIS WITHOUT CONTRAST TECHNIQUE: Multidetector CT imaging of the chest, abdomen and pelvis was performed following the standard protocol without IV contrast. COMPARISON:  Ultrasound 04/30/2019, chest x-ray 04/29/2019 FINDINGS: CT CHEST FINDINGS Cardiovascular: Moderate aortic atherosclerosis without aneurysm. Extensive coronary vascular calcification. Cardiomegaly. No significant  pericardial effusion. Post CABG changes. Left-sided cardiac pacing device with single lead tip at the right ventricle. Mediastinum/Nodes: Midline trachea. No thyroid mass. No significant adenopathy. Esophagus shows mild air distention but is otherwise unremarkable Lungs/Pleura: Moderate emphysema. Trace left pleural effusion. Small moderate right pleural effusion. No focal consolidation. 3 mm subpleural left upper lobe lung nodule, series 4, image number 74. Musculoskeletal: Post sternotomy changes. No acute or suspicious osseous lesion CT ABDOMEN PELVIS FINDINGS Hepatobiliary: No focal hepatic abnormality. Hyperdense sludge or small stones in the gallbladder. No biliary dilatation Pancreas: Unremarkable. No pancreatic ductal dilatation or surrounding inflammatory changes. Spleen: Normal in size without focal abnormality. Adrenals/Urinary Tract: Adrenal glands are normal. No hydronephrosis. Scarring and mild atrophy of the right kidney, most prominent involving the lower pole. The bladder is unremarkable. Stomach/Bowel: Stomach is within normal limits. Appendix appears normal. No  evidence of bowel wall thickening, distention, or inflammatory changes. Vascular/Lymphatic: Moderate aortic atherosclerosis. Focal ectasia of the distal infrarenal abdominal aorta measuring up to 2.8 cm. No significantly enlarged lymph nodes Reproductive: Slightly enlarged prostate with scattered calcification Other: No free air. Small amount of free fluid in the pelvis. Trace perihepatic free fluid. Musculoskeletal: Degenerative changes of the spine with retrolisthesis of L4 on L5. No acute or suspicious osseous abnormality. IMPRESSION: 1. Cardiomegaly with trace left pleural effusion and small moderate right pleural effusion. No focal airspace consolidation. 2. Moderate emphysema. 3 mm left upper lobe lung nodule. No follow-up needed if patient is low-risk. Non-contrast chest CT can be considered in 12 months if patient is high-risk. This recommendation follows the consensus statement: Guidelines for Management of Incidental Pulmonary Nodules Detected on CT Images: From the Fleischner Society 2017; Radiology 2017; 284:228-243. 3. Small amount of free fluid in the pelvis and adjacent to the liver. 4. Gallbladder sludge versus small stones without acute inflammatory change 5. Scarring and atrophy of right kidney 6. Distal infrarenal abdominal aorta measures up to 2.8 cm. Ectatic abdominal aorta at risk for aneurysm development. Recommend followup by ultrasound in 5 years. This recommendation follows ACR consensus guidelines: White Paper of the ACR Incidental Findings Committee II on Vascular Findings. J Am Coll Radiol 2013; 10:789-794. Electronically Signed   By: Donavan Foil M.D.   On: 04/30/2019 19:23   Dg Chest 1 View  Result Date: 04/29/2019 CLINICAL DATA:  Shortness of breath. Lower extremity swelling. EXAM: CHEST  1 VIEW COMPARISON:  01/28/2018 FINDINGS: Prior CABG, an ICD, cardiomegaly, and aortic atherosclerosis are again noted. There is central pulmonary vascular congestion without overt edema. No  airspace consolidation, sizeable pleural effusion, pneumothorax is identified. No acute osseous abnormality is seen. IMPRESSION: Cardiomegaly and pulmonary vascular congestion. Electronically Signed   By: Logan Bores M.D.   On: 04/29/2019 15:39   Ct Chest Wo Contrast  Result Date: 04/30/2019 CLINICAL DATA:  Heart failure short of breath abdominal fullness EXAM: CT CHEST, ABDOMEN AND PELVIS WITHOUT CONTRAST TECHNIQUE: Multidetector CT imaging of the chest, abdomen and pelvis was performed following the standard protocol without IV contrast. COMPARISON:  Ultrasound 04/30/2019, chest x-ray 04/29/2019 FINDINGS: CT CHEST FINDINGS Cardiovascular: Moderate aortic atherosclerosis without aneurysm. Extensive coronary vascular calcification. Cardiomegaly. No significant pericardial effusion. Post CABG changes. Left-sided cardiac pacing device with single lead tip at the right ventricle. Mediastinum/Nodes: Midline trachea. No thyroid mass. No significant adenopathy. Esophagus shows mild air distention but is otherwise unremarkable Lungs/Pleura: Moderate emphysema. Trace left pleural effusion. Small moderate right pleural effusion. No focal consolidation. 3 mm subpleural left upper lobe lung nodule, series  4, image number 74. Musculoskeletal: Post sternotomy changes. No acute or suspicious osseous lesion CT ABDOMEN PELVIS FINDINGS Hepatobiliary: No focal hepatic abnormality. Hyperdense sludge or small stones in the gallbladder. No biliary dilatation Pancreas: Unremarkable. No pancreatic ductal dilatation or surrounding inflammatory changes. Spleen: Normal in size without focal abnormality. Adrenals/Urinary Tract: Adrenal glands are normal. No hydronephrosis. Scarring and mild atrophy of the right kidney, most prominent involving the lower pole. The bladder is unremarkable. Stomach/Bowel: Stomach is within normal limits. Appendix appears normal. No evidence of bowel wall thickening, distention, or inflammatory changes.  Vascular/Lymphatic: Moderate aortic atherosclerosis. Focal ectasia of the distal infrarenal abdominal aorta measuring up to 2.8 cm. No significantly enlarged lymph nodes Reproductive: Slightly enlarged prostate with scattered calcification Other: No free air. Small amount of free fluid in the pelvis. Trace perihepatic free fluid. Musculoskeletal: Degenerative changes of the spine with retrolisthesis of L4 on L5. No acute or suspicious osseous abnormality. IMPRESSION: 1. Cardiomegaly with trace left pleural effusion and small moderate right pleural effusion. No focal airspace consolidation. 2. Moderate emphysema. 3 mm left upper lobe lung nodule. No follow-up needed if patient is low-risk. Non-contrast chest CT can be considered in 12 months if patient is high-risk. This recommendation follows the consensus statement: Guidelines for Management of Incidental Pulmonary Nodules Detected on CT Images: From the Fleischner Society 2017; Radiology 2017; 284:228-243. 3. Small amount of free fluid in the pelvis and adjacent to the liver. 4. Gallbladder sludge versus small stones without acute inflammatory change 5. Scarring and atrophy of right kidney 6. Distal infrarenal abdominal aorta measures up to 2.8 cm. Ectatic abdominal aorta at risk for aneurysm development. Recommend followup by ultrasound in 5 years. This recommendation follows ACR consensus guidelines: White Paper of the ACR Incidental Findings Committee II on Vascular Findings. J Am Coll Radiol 2013; 10:789-794. Electronically Signed   By: Donavan Foil M.D.   On: 04/30/2019 19:23   US Renal  Result Date: 04/30/2019 CLINICAL DATA:  Acute kidney injury EXAM: RENAL / URINARY TRACT ULTRASOUND COMPLETE COMPARISON:  01/27/2018 FINDINGS: Right Kidney: Renal measurements: 8.1 x 4.3 x 4.4 cm = volume: 80 mL. Increased echogenicity. No hydronephrosis. No focal lesion. Left Kidney: Renal measurements: 9.2 x 5.5 x 5.3 cm = volume: 141 mL. Echogenicity within normal  limits. No mass or hydronephrosis visualized. Bladder: Empty.  Patient recently voided. Right pleural effusion is noted. IMPRESSION: Since the prior study, the right kidney has gotten smaller. This suggests interval vascular insult. There is no evidence hydronephrosis. Left kidney is within normal limits. Electronically Signed   By: Nelson Chimes M.D.   On: 04/30/2019 10:21      1. The left ventricle has severely reduced systolic function, with an ejection fraction of 20-25%. The cavity size was mildly dilated. Left ventricular diastolic Doppler parameters are consistent with impaired relaxation.  2. The right ventricle has mildly reduced systolic function. The cavity was mildly enlarged. There is no increase in right ventricular wall thickness.  3. Left atrial size was moderately dilated.  4. Right atrial size was mildly dilated.  5. The tricuspid valve is not well visualized. Tricuspid valve regurgitation is moderate.  6. The aortic valve is tricuspid. Aortic valve regurgitation is trivial by color flow Doppler.  7. The interatrial septum was not     ASSESSMENT/PLAN    Severe dyspnea with minimal exertion -Likely due to acute decompensated systolic CHF with EF 20 to 74% complicated by compressive atelectasis with pleural effusions as well as centrilobular emphysema with  ongoing marijuana abuse and CKD hindering diuresis -Cardiology on case appreciate input -BNP elevated at 844 previous 1000 during acute exacerbation of heart failure -Net negative only 1250 cc at this point -will add albumin to increase lasix efficacy -pt with CKD will have suboptimal diuretic response - recommend renal evaluation  Strict I&O -1200cc fluid restriction  -CT chest without contrast - poss PE however low likelyhood, may consider [place] LE    Centrilobular emphysema Actively smoking THC -Smoking cessation counseling provided today -will need outpatient evaluation pulmonary clinic with optimization of inhaler  therapy and likely COPD     Bilateral pleural effusions worse on the right -Likely due to severe systolic CHF with CKD -Management with diuresis -Cardiology on case appreciate input -Recommend nephrology evaluation        Thank you for allowing me to participate in the care of this patient.    Patient/Family are satisfied with care plan and all questions have been answered.  This document was prepared using Dragon voice recognition software and may include unintentional dictation errors.     Ottie Glazier, M.D.  Division of Center

## 2019-05-01 NOTE — Progress Notes (Signed)
MD notified via text. RN finds patient SOB at rest. Lungs clear to ascultation, 98% on room air, 22 rpm and diaphoretic. RN placed on 2L via [place] for comfort. I will await any new orders and continue to assess.

## 2019-05-02 LAB — RENAL FUNCTION PANEL
Albumin: 4 g/dL (ref 3.5–5.0)
Anion gap: 18 — ABNORMAL HIGH (ref 5–15)
BUN: 53 mg/dL — ABNORMAL HIGH (ref 8–23)
CO2: 21 mmol/L — ABNORMAL LOW (ref 22–32)
Calcium: 9.6 mg/dL (ref 8.9–10.3)
Chloride: 98 mmol/L (ref 98–111)
Creatinine, Ser: 2.82 mg/dL — ABNORMAL HIGH (ref 0.61–1.24)
GFR calc Af Amer: 25 mL/min — ABNORMAL LOW
GFR calc non Af Amer: 22 mL/min — ABNORMAL LOW
Glucose, Bld: 128 mg/dL — ABNORMAL HIGH (ref 70–99)
Phosphorus: 3.9 mg/dL (ref 2.5–4.6)
Potassium: 3.6 mmol/L (ref 3.5–5.1)
Sodium: 137 mmol/L (ref 135–145)

## 2019-05-02 MED ORDER — BUDESONIDE 0.25 MG/2ML IN SUSP: 0.2500 mg | Freq: Two times a day (BID) | RESPIRATORY_TRACT | 12 refills | Status: AC

## 2019-05-02 MED ORDER — DILTIAZEM HCL ER COATED BEADS 120 MG PO CP24: 120.0000 mg | ORAL_CAPSULE | Freq: Every day | ORAL | 0 refills | Status: AC

## 2019-05-02 MED ORDER — DILTIAZEM HCL ER COATED BEADS 120 MG PO CP24: 120.0000 mg | ORAL_CAPSULE | Freq: Every day | ORAL | Status: DC

## 2019-05-02 MED ORDER — PREDNISONE 10 MG (21) PO TBPK: ORAL_TABLET | ORAL | 0 refills | Status: AC

## 2019-05-02 MED ORDER — TORSEMIDE 20 MG PO TABS: 40.0000 mg | ORAL_TABLET | Freq: Two times a day (BID) | ORAL | 0 refills | Status: AC

## 2019-05-02 NOTE — Progress Notes (Signed)
IV and tele removed from patient. Discharge instructions given to patient. Verbalized understanding. No acute distress at this time. Wife to transport patient home.

## 2019-05-02 NOTE — Progress Notes (Signed)
Pulmonary Medicine          Date: 05/02/2019,   MRN# [id number] Chris Nguyen 1951-02-27     AdmissionWeight: 81.6 kg                 CurrentWeight: 82.5 kg       SUBJECTIVE   Patient feels significant improvement in dyspnea.  Plan for medical optimization and discharge today with close follow-up with pulmonary clinic on outpatient.   PAST MEDICAL HISTORY   Past Medical History:  Diagnosis Date   AICD (automatic cardioverter/defibrillator) present    BPH (benign prostatic hyperplasia)    CAD (coronary artery disease) 05/14/2006   Cardiac arrest ([place]) 11/12/2013   Overview:  December 2014: ICD implanted  Last Assessment & Plan:  Relevant Hx: Course: Daily Update: Today's Plan:    Chronic systolic CHF (congestive heart failure) (HCC)    Erectile dysfunction    H/O coronary artery bypass surgery 05/17/2015   Overview:  LIMA to LAD    HLD (hyperlipidemia)    Osteoarthritis of left wrist    Paroxysmal atrial fibrillation (HCC)      SURGICAL HISTORY   Past Surgical History:  Procedure Laterality Date   CARDIAC CATHETERIZATION  06/27/2008   CARPAL TUNNEL RELEASE     CORONARY ARTERY BYPASS GRAFT  2006 and 2014   LIMA to LAD at [facility] 2014   MITRAL VALVE REPAIR       FAMILY HISTORY   Family History  Problem Relation Age of Onset   CAD Mother 43   Diabetes Mother        Diabetes Mellitus   Dementia Father    Glaucoma Father    Heart disease Other      SOCIAL HISTORY   Social History   Tobacco Use   Smoking status: Former Smoker    Last attempt to quit: 12/01/2004    Years since quitting: 14.4   Smokeless tobacco: Never Used  Substance Use Topics   Alcohol use: Yes    Alcohol/week: 7.0 - 9.0 standard drinks    Types: 1 Glasses of wine, 6 - 8 Cans of beer per week   Drug use: No     MEDICATIONS    Home Medication:  Current Outpatient Rx   Order #: [id number] Class: Normal   Order #: [id number] Class: Normal   Order  #: [id number] Class: Normal   Order #: [id number] Class: Normal    Current Medication:  Current Facility-Administered Medications:    0.9 %  sodium chloride infusion, 250 mL, Intravenous, PRN, Mayo, Pete Pelt, MD   acetaminophen (TYLENOL) tablet 650 mg, 650 mg, Oral, Q4H PRN, Mayo, Pete Pelt, MD   alum & mag hydroxide-simeth (MAALOX/MYLANTA) 200-200-20 MG/5ML suspension 30 mL, 30 mL, Oral, Q6H PRN, Kolluru, Sarath, MD, 30 mL at 05/01/19 1511   amiodarone (PACERONE) tablet 200 mg, 200 mg, Oral, BID, Mayo, Pete Pelt, MD, 200 mg at 05/02/19 0855   aspirin EC tablet 81 mg, 81 mg, Oral, Daily, Mayo, Pete Pelt, MD, 81 mg at 05/02/19 0855   budesonide (PULMICORT) nebulizer solution 0.25 mg, 0.25 mg, Nebulization, BID, Dustin Flock, MD, 0.25 mg at 05/02/19 0855   diltiazem (CARDIZEM CD) 24 hr capsule 120 mg, 120 mg, Oral, Daily, Dustin Flock, MD   heparin injection 5,000 Units, 5,000 Units, Subcutaneous, Q8H, Dustin Flock, MD, 5,000 Units at 05/01/19 1512   levalbuterol (XOPENEX) nebulizer solution 1.25 mg, 1.25 mg, Nebulization, Q6H, Dustin Flock, MD, 1.25 mg at 05/02/19 0855   ondansetron (  ZOFRAN) injection 4 mg, 4 mg, Intravenous, Q6H PRN, Mayo, Pete Pelt, MD   sodium chloride flush (NS) 0.9 % injection 3 mL, 3 mL, Intravenous, Q12H, Mayo, Pete Pelt, MD, 3 mL at 05/02/19 0857   sodium chloride flush (NS) 0.9 % injection 3 mL, 3 mL, Intravenous, PRN, Mayo, Pete Pelt, MD   traMADol Veatrice Bourbon) tablet 100 mg, 100 mg, Oral, Q12H PRN, Mayo, Pete Pelt, MD    ALLERGIES   Spironolactone; Entresto [sacubitril-valsartan]; and Ziac [bisoprolol-hydrochlorothiazide]     REVIEW OF SYSTEMS    Review of Systems:  Gen:  Denies  fever, sweats, chills weigh loss  HEENT: Denies blurred vision, double vision, ear pain, eye pain, hearing loss, nose bleeds, sore throat Cardiac:  No dizziness, chest pain or heaviness, chest tightness,edema Resp:   Denies cough or sputum porduction,  shortness of breath,wheezing, hemoptysis,  Gi: Denies swallowing difficulty, stomach pain, nausea or vomiting, diarrhea, constipation, bowel incontinence Gu:  Denies bladder incontinence, burning urine Ext:   Denies Joint pain, stiffness or swelling Skin: Denies  skin rash, easy bruising or bleeding or hives Endoc:  Denies polyuria, polydipsia , polyphagia or weight change Psych:   Denies depression, insomnia or hallucinations   Other:  All other systems negative   VS: BP (!) 119/104 (BP Location: Right Arm)    Pulse (!) 106    Temp (!) 97.5 F (36.4 C) (Oral)    Resp 18    Ht 5\' 10"  (1.778 m)    Wt 82.5 kg    SpO2 96%    BMI 26.09 kg/m      PHYSICAL EXAM    GENERAL:NAD, no fevers, chills, no weakness no fatigue HEAD: Normocephalic, atraumatic.  EYES: Pupils equal, round, reactive to light. Extraocular muscles intact. No scleral icterus.  MOUTH: Moist mucosal membrane. Dentition intact. No abscess noted.  EAR, NOSE, THROAT: Clear without exudates. No external lesions.  NECK: Supple. No thyromegaly. No nodules. No JVD.  PULMONARY: Mild crackles at the bases bilaterally wheezes CARDIOVASCULAR: S1 and S2. Regular rate and rhythm. No murmurs, rubs, or gallops. No edema. Pedal pulses 2+ bilaterally.  GASTROINTESTINAL: Soft, nontender, nondistended. No masses. Positive bowel sounds. No hepatosplenomegaly.  MUSCULOSKELETAL: No swelling, clubbing, or edema. Range of motion full in all extremities.  NEUROLOGIC: Cranial nerves II through XII are intact. No gross focal neurological deficits. Sensation intact. Reflexes intact.  SKIN: No ulceration, lesions, rashes, or cyanosis. Skin warm and dry. Turgor intact.  PSYCHIATRIC: Mood, affect within normal limits. The patient is awake, alert and oriented x 3. Insight, judgment intact.       IMAGING    Ct Abdomen Pelvis Wo Contrast  Result Date: 04/30/2019 CLINICAL DATA:  Heart failure short of breath abdominal fullness EXAM: CT CHEST,  ABDOMEN AND PELVIS WITHOUT CONTRAST TECHNIQUE: Multidetector CT imaging of the chest, abdomen and pelvis was performed following the standard protocol without IV contrast. COMPARISON:  Ultrasound 04/30/2019, chest x-ray 04/29/2019 FINDINGS: CT CHEST FINDINGS Cardiovascular: Moderate aortic atherosclerosis without aneurysm. Extensive coronary vascular calcification. Cardiomegaly. No significant pericardial effusion. Post CABG changes. Left-sided cardiac pacing device with single lead tip at the right ventricle. Mediastinum/Nodes: Midline trachea. No thyroid mass. No significant adenopathy. Esophagus shows mild air distention but is otherwise unremarkable Lungs/Pleura: Moderate emphysema. Trace left pleural effusion. Small moderate right pleural effusion. No focal consolidation. 3 mm subpleural left upper lobe lung nodule, series 4, image number 74. Musculoskeletal: Post sternotomy changes. No acute or suspicious osseous lesion CT ABDOMEN PELVIS FINDINGS Hepatobiliary: No focal  hepatic abnormality. Hyperdense sludge or small stones in the gallbladder. No biliary dilatation Pancreas: Unremarkable. No pancreatic ductal dilatation or surrounding inflammatory changes. Spleen: Normal in size without focal abnormality. Adrenals/Urinary Tract: Adrenal glands are normal. No hydronephrosis. Scarring and mild atrophy of the right kidney, most prominent involving the lower pole. The bladder is unremarkable. Stomach/Bowel: Stomach is within normal limits. Appendix appears normal. No evidence of bowel wall thickening, distention, or inflammatory changes. Vascular/Lymphatic: Moderate aortic atherosclerosis. Focal ectasia of the distal infrarenal abdominal aorta measuring up to 2.8 cm. No significantly enlarged lymph nodes Reproductive: Slightly enlarged prostate with scattered calcification Other: No free air. Small amount of free fluid in the pelvis. Trace perihepatic free fluid. Musculoskeletal: Degenerative changes of the spine  with retrolisthesis of L4 on L5. No acute or suspicious osseous abnormality. IMPRESSION: 1. Cardiomegaly with trace left pleural effusion and small moderate right pleural effusion. No focal airspace consolidation. 2. Moderate emphysema. 3 mm left upper lobe lung nodule. No follow-up needed if patient is low-risk. Non-contrast chest CT can be considered in 12 months if patient is high-risk. This recommendation follows the consensus statement: Guidelines for Management of Incidental Pulmonary Nodules Detected on CT Images: From the Fleischner Society 2017; Radiology 2017; 284:228-243. 3. Small amount of free fluid in the pelvis and adjacent to the liver. 4. Gallbladder sludge versus small stones without acute inflammatory change 5. Scarring and atrophy of right kidney 6. Distal infrarenal abdominal aorta measures up to 2.8 cm. Ectatic abdominal aorta at risk for aneurysm development. Recommend followup by ultrasound in 5 years. This recommendation follows ACR consensus guidelines: White Paper of the ACR Incidental Findings Committee II on Vascular Findings. J Am Coll Radiol 2013; 10:789-794. Electronically Signed   By: Donavan Foil M.D.   On: 04/30/2019 19:23   Dg Chest 1 View  Result Date: 04/29/2019 CLINICAL DATA:  Shortness of breath. Lower extremity swelling. EXAM: CHEST  1 VIEW COMPARISON:  01/28/2018 FINDINGS: Prior CABG, an ICD, cardiomegaly, and aortic atherosclerosis are again noted. There is central pulmonary vascular congestion without overt edema. No airspace consolidation, sizeable pleural effusion, pneumothorax is identified. No acute osseous abnormality is seen. IMPRESSION: Cardiomegaly and pulmonary vascular congestion. Electronically Signed   By: Logan Bores M.D.   On: 04/29/2019 15:39   Ct Chest Wo Contrast  Result Date: 04/30/2019 CLINICAL DATA:  Heart failure short of breath abdominal fullness EXAM: CT CHEST, ABDOMEN AND PELVIS WITHOUT CONTRAST TECHNIQUE: Multidetector CT imaging of the  chest, abdomen and pelvis was performed following the standard protocol without IV contrast. COMPARISON:  Ultrasound 04/30/2019, chest x-ray 04/29/2019 FINDINGS: CT CHEST FINDINGS Cardiovascular: Moderate aortic atherosclerosis without aneurysm. Extensive coronary vascular calcification. Cardiomegaly. No significant pericardial effusion. Post CABG changes. Left-sided cardiac pacing device with single lead tip at the right ventricle. Mediastinum/Nodes: Midline trachea. No thyroid mass. No significant adenopathy. Esophagus shows mild air distention but is otherwise unremarkable Lungs/Pleura: Moderate emphysema. Trace left pleural effusion. Small moderate right pleural effusion. No focal consolidation. 3 mm subpleural left upper lobe lung nodule, series 4, image number 74. Musculoskeletal: Post sternotomy changes. No acute or suspicious osseous lesion CT ABDOMEN PELVIS FINDINGS Hepatobiliary: No focal hepatic abnormality. Hyperdense sludge or small stones in the gallbladder. No biliary dilatation Pancreas: Unremarkable. No pancreatic ductal dilatation or surrounding inflammatory changes. Spleen: Normal in size without focal abnormality. Adrenals/Urinary Tract: Adrenal glands are normal. No hydronephrosis. Scarring and mild atrophy of the right kidney, most prominent involving the lower pole. The bladder is unremarkable. Stomach/Bowel: Stomach is  within normal limits. Appendix appears normal. No evidence of bowel wall thickening, distention, or inflammatory changes. Vascular/Lymphatic: Moderate aortic atherosclerosis. Focal ectasia of the distal infrarenal abdominal aorta measuring up to 2.8 cm. No significantly enlarged lymph nodes Reproductive: Slightly enlarged prostate with scattered calcification Other: No free air. Small amount of free fluid in the pelvis. Trace perihepatic free fluid. Musculoskeletal: Degenerative changes of the spine with retrolisthesis of L4 on L5. No acute or suspicious osseous abnormality.  IMPRESSION: 1. Cardiomegaly with trace left pleural effusion and small moderate right pleural effusion. No focal airspace consolidation. 2. Moderate emphysema. 3 mm left upper lobe lung nodule. No follow-up needed if patient is low-risk. Non-contrast chest CT can be considered in 12 months if patient is high-risk. This recommendation follows the consensus statement: Guidelines for Management of Incidental Pulmonary Nodules Detected on CT Images: From the Fleischner Society 2017; Radiology 2017; 284:228-243. 3. Small amount of free fluid in the pelvis and adjacent to the liver. 4. Gallbladder sludge versus small stones without acute inflammatory change 5. Scarring and atrophy of right kidney 6. Distal infrarenal abdominal aorta measures up to 2.8 cm. Ectatic abdominal aorta at risk for aneurysm development. Recommend followup by ultrasound in 5 years. This recommendation follows ACR consensus guidelines: White Paper of the ACR Incidental Findings Committee II on Vascular Findings. J Am Coll Radiol 2013; 10:789-794. Electronically Signed   By: Donavan Foil M.D.   On: 04/30/2019 19:23   US Renal  Result Date: 04/30/2019 CLINICAL DATA:  Acute kidney injury EXAM: RENAL / URINARY TRACT ULTRASOUND COMPLETE COMPARISON:  01/27/2018 FINDINGS: Right Kidney: Renal measurements: 8.1 x 4.3 x 4.4 cm = volume: 80 mL. Increased echogenicity. No hydronephrosis. No focal lesion. Left Kidney: Renal measurements: 9.2 x 5.5 x 5.3 cm = volume: 141 mL. Echogenicity within normal limits. No mass or hydronephrosis visualized. Bladder: Empty.  Patient recently voided. Right pleural effusion is noted. IMPRESSION: Since the prior study, the right kidney has gotten smaller. This suggests interval vascular insult. There is no evidence hydronephrosis. Left kidney is within normal limits. Electronically Signed   By: Nelson Chimes M.D.   On: 04/30/2019 10:21      ASSESSMENT/PLAN   Severe dyspnea with minimal exertion -Likely due to acute  decompensated systolic CHF with EF 20 to 27% complicated by compressive atelectasis with pleural effusions as well as centrilobular emphysema with ongoing marijuana abuse and CKD hindering diuresis -Cardiology on case appreciate input -BNP elevated at 844 previous 1000 during acute exacerbation of heart failure -Net negative only 1250 cc at this point -will add albumin to increase lasix efficacy -pt with CKD will have suboptimal diuretic response - recommend renal evaluation  Strict I&O -1200cc fluid restriction  -CT chest without contrast - poss PE however low likelyhood, may consider [place] LE    Centrilobular emphysema Actively smoking THC -Smoking cessation counseling provided today -will need outpatient evaluation pulmonary clinic with optimization of inhaler therapy and likely COPD     Bilateral pleural effusions worse on the right -Likely due to severe systolic CHF with CKD -Management with diuresis -Cardiology on case appreciate input -Recommend nephrology evaluation        Thank you for allowing me to participate in the care of this patient.    Patient/Family are satisfied with care plan and all questions have been answered.   This document was prepared using Dragon voice recognition software and may include unintentional dictation errors.     Ottie Glazier, M.D.  Division of Pulmonary &  [place]

## 2019-05-02 NOTE — Discharge Summary (Signed)
[employer] - [place] at [facility], 68 y.o., DOB 1951-06-18, MRN [id number]. Admission date: 04/29/2019 Discharge Date 05/02/2019 Primary MD Birdie Sons, MD Admitting Physician Sela Hua, MD  Admission Diagnosis  SOB (shortness of breath) [R06.02] Elevated troponin [R79.89] Acute on chronic congestive heart failure, unspecified heart failure type [facility]) [I50.9]  Discharge Diagnosis   Active Problems:   Acute on chronic combined systolic and diastolic CHF (congestive heart failure) (HCC) Acute COPD exasperation new diagnosis for this patient Acute kidney injury on chronic kidney disease Elevated troponin due to demand ischemia Paroxysmal atrial fibrillation Hypokalemia    Hospital Course Patient 68 year old African-American male presented with shortness of breath.  He was evaluated in the ED initially thought to have CHF however he did not put out second significant urine.  Further evaluation with a CT showed he had severe COPD.  Patient was seen by pulmonary.  He was started on nebulizer therapy with improvement in his symptoms.  He was also seen by cardiology.  Patient will need outpatient follow-up for PFTs.  Was ambulated his oxygen remained above 94.            Consults  cardiology, pulmonary  Significant Tests:  See full reports for all details    Ct Abdomen Pelvis Wo Contrast  Result Date: 04/30/2019 CLINICAL DATA:  Heart failure short of breath abdominal fullness EXAM: CT CHEST, ABDOMEN AND PELVIS WITHOUT CONTRAST TECHNIQUE: Multidetector CT imaging of the chest, abdomen and pelvis was performed following the standard protocol without IV contrast. COMPARISON:  Ultrasound 04/30/2019, chest x-ray 04/29/2019 FINDINGS: CT CHEST FINDINGS Cardiovascular: Moderate aortic atherosclerosis without aneurysm. Extensive coronary vascular calcification. Cardiomegaly. No significant pericardial effusion. Post CABG changes. Left-sided cardiac  pacing device with single lead tip at the right ventricle. Mediastinum/Nodes: Midline trachea. No thyroid mass. No significant adenopathy. Esophagus shows mild air distention but is otherwise unremarkable Lungs/Pleura: Moderate emphysema. Trace left pleural effusion. Small moderate right pleural effusion. No focal consolidation. 3 mm subpleural left upper lobe lung nodule, series 4, image number 74. Musculoskeletal: Post sternotomy changes. No acute or suspicious osseous lesion CT ABDOMEN PELVIS FINDINGS Hepatobiliary: No focal hepatic abnormality. Hyperdense sludge or small stones in the gallbladder. No biliary dilatation Pancreas: Unremarkable. No pancreatic ductal dilatation or surrounding inflammatory changes. Spleen: Normal in size without focal abnormality. Adrenals/Urinary Tract: Adrenal glands are normal. No hydronephrosis. Scarring and mild atrophy of the right kidney, most prominent involving the lower pole. The bladder is unremarkable. Stomach/Bowel: Stomach is within normal limits. Appendix appears normal. No evidence of bowel wall thickening, distention, or inflammatory changes. Vascular/Lymphatic: Moderate aortic atherosclerosis. Focal ectasia of the distal infrarenal abdominal aorta measuring up to 2.8 cm. No significantly enlarged lymph nodes Reproductive: Slightly enlarged prostate with scattered calcification Other: No free air. Small amount of free fluid in the pelvis. Trace perihepatic free fluid. Musculoskeletal: Degenerative changes of the spine with retrolisthesis of L4 on L5. No acute or suspicious osseous abnormality. IMPRESSION: 1. Cardiomegaly with trace left pleural effusion and small moderate right pleural effusion. No focal airspace consolidation. 2. Moderate emphysema. 3 mm left upper lobe lung nodule. No follow-up needed if patient is low-risk. Non-contrast chest CT can be considered in 12 months if patient is high-risk. This recommendation follows the consensus statement: Guidelines  for Management of Incidental Pulmonary Nodules Detected on CT Images: From the Fleischner Society 2017; Radiology 2017; 284:228-243. 3. Small amount of free fluid in the pelvis and adjacent to the liver. 4. Gallbladder  sludge versus small stones without acute inflammatory change 5. Scarring and atrophy of right kidney 6. Distal infrarenal abdominal aorta measures up to 2.8 cm. Ectatic abdominal aorta at risk for aneurysm development. Recommend followup by ultrasound in 5 years. This recommendation follows ACR consensus guidelines: White Paper of the ACR Incidental Findings Committee II on Vascular Findings. J Am Coll Radiol 2013; 10:789-794. Electronically Signed   By: Donavan Foil M.D.   On: 04/30/2019 19:23   Dg Chest 1 View  Result Date: 04/29/2019 CLINICAL DATA:  Shortness of breath. Lower extremity swelling. EXAM: CHEST  1 VIEW COMPARISON:  01/28/2018 FINDINGS: Prior CABG, an ICD, cardiomegaly, and aortic atherosclerosis are again noted. There is central pulmonary vascular congestion without overt edema. No airspace consolidation, sizeable pleural effusion, pneumothorax is identified. No acute osseous abnormality is seen. IMPRESSION: Cardiomegaly and pulmonary vascular congestion. Electronically Signed   By: Logan Bores M.D.   On: 04/29/2019 15:39   Ct Chest Wo Contrast  Result Date: 04/30/2019 CLINICAL DATA:  Heart failure short of breath abdominal fullness EXAM: CT CHEST, ABDOMEN AND PELVIS WITHOUT CONTRAST TECHNIQUE: Multidetector CT imaging of the chest, abdomen and pelvis was performed following the standard protocol without IV contrast. COMPARISON:  Ultrasound 04/30/2019, chest x-ray 04/29/2019 FINDINGS: CT CHEST FINDINGS Cardiovascular: Moderate aortic atherosclerosis without aneurysm. Extensive coronary vascular calcification. Cardiomegaly. No significant pericardial effusion. Post CABG changes. Left-sided cardiac pacing device with single lead tip at the right ventricle. Mediastinum/Nodes:  Midline trachea. No thyroid mass. No significant adenopathy. Esophagus shows mild air distention but is otherwise unremarkable Lungs/Pleura: Moderate emphysema. Trace left pleural effusion. Small moderate right pleural effusion. No focal consolidation. 3 mm subpleural left upper lobe lung nodule, series 4, image number 74. Musculoskeletal: Post sternotomy changes. No acute or suspicious osseous lesion CT ABDOMEN PELVIS FINDINGS Hepatobiliary: No focal hepatic abnormality. Hyperdense sludge or small stones in the gallbladder. No biliary dilatation Pancreas: Unremarkable. No pancreatic ductal dilatation or surrounding inflammatory changes. Spleen: Normal in size without focal abnormality. Adrenals/Urinary Tract: Adrenal glands are normal. No hydronephrosis. Scarring and mild atrophy of the right kidney, most prominent involving the lower pole. The bladder is unremarkable. Stomach/Bowel: Stomach is within normal limits. Appendix appears normal. No evidence of bowel wall thickening, distention, or inflammatory changes. Vascular/Lymphatic: Moderate aortic atherosclerosis. Focal ectasia of the distal infrarenal abdominal aorta measuring up to 2.8 cm. No significantly enlarged lymph nodes Reproductive: Slightly enlarged prostate with scattered calcification Other: No free air. Small amount of free fluid in the pelvis. Trace perihepatic free fluid. Musculoskeletal: Degenerative changes of the spine with retrolisthesis of L4 on L5. No acute or suspicious osseous abnormality. IMPRESSION: 1. Cardiomegaly with trace left pleural effusion and small moderate right pleural effusion. No focal airspace consolidation. 2. Moderate emphysema. 3 mm left upper lobe lung nodule. No follow-up needed if patient is low-risk. Non-contrast chest CT can be considered in 12 months if patient is high-risk. This recommendation follows the consensus statement: Guidelines for Management of Incidental Pulmonary Nodules Detected on CT Images: From the  Fleischner Society 2017; Radiology 2017; 284:228-243. 3. Small amount of free fluid in the pelvis and adjacent to the liver. 4. Gallbladder sludge versus small stones without acute inflammatory change 5. Scarring and atrophy of right kidney 6. Distal infrarenal abdominal aorta measures up to 2.8 cm. Ectatic abdominal aorta at risk for aneurysm development. Recommend followup by ultrasound in 5 years. This recommendation follows ACR consensus guidelines: White Paper of the ACR Incidental Findings Committee II on Vascular Findings. J  Am Coll Radiol 2013; 10:789-794. Electronically Signed   By: Donavan Foil M.D.   On: 04/30/2019 19:23   US Renal  Result Date: 04/30/2019 CLINICAL DATA:  Acute kidney injury EXAM: RENAL / URINARY TRACT ULTRASOUND COMPLETE COMPARISON:  01/27/2018 FINDINGS: Right Kidney: Renal measurements: 8.1 x 4.3 x 4.4 cm = volume: 80 mL. Increased echogenicity. No hydronephrosis. No focal lesion. Left Kidney: Renal measurements: 9.2 x 5.5 x 5.3 cm = volume: 141 mL. Echogenicity within normal limits. No mass or hydronephrosis visualized. Bladder: Empty.  Patient recently voided. Right pleural effusion is noted. IMPRESSION: Since the prior study, the right kidney has gotten smaller. This suggests interval vascular insult. There is no evidence hydronephrosis. Left kidney is within normal limits. Electronically Signed   By: Nelson Chimes M.D.   On: 04/30/2019 10:21       Today   Subjective:   Chris Nguyen patient doing much better shortness of breath much improved Objective:   Blood pressure (!) 119/104, pulse (!) 106, temperature (!) 97.5 F (36.4 C), temperature source Oral, resp. rate 18, height 5\' 10"  (1.778 m), weight 82.5 kg, SpO2 96 %.  .  Intake/Output Summary (Last 24 hours) at 05/02/2019 1429 Last data filed at 05/02/2019 1345 Gross per 24 hour  Intake 480 ml  Output 350 ml  Net 130 ml    Exam VITAL SIGNS: Blood pressure (!) 119/104, pulse (!) 106, temperature (!) 97.5 F  (36.4 C), temperature source Oral, resp. rate 18, height 5\' 10"  (1.778 m), weight 82.5 kg, SpO2 96 %.  GENERAL:  68 y.o.-year-old patient lying in the bed with no acute distress.  EYES: Pupils equal, round, reactive to light and accommodation. No scleral icterus. Extraocular muscles intact.  HEENT: Head atraumatic, normocephalic. Oropharynx and nasopharynx clear.  NECK:  Supple, no jugular venous distention. No thyroid enlargement, no tenderness.  LUNGS: Normal breath sounds bilaterally, no wheezing, rales,rhonchi or crepitation. No use of accessory muscles of respiration.  CARDIOVASCULAR: S1, S2 normal. No murmurs, rubs, or gallops.  ABDOMEN: Soft, nontender, nondistended. Bowel sounds present. No organomegaly or mass.  EXTREMITIES: No pedal edema, cyanosis, or clubbing.  NEUROLOGIC: Cranial nerves II through XII are intact. Muscle strength 5/5 in all extremities. Sensation intact. Gait not checked.  PSYCHIATRIC: The patient is alert and oriented x 3.  SKIN: No obvious rash, lesion, or ulcer.   Data Review     CBC w Diff:  Lab Results  Component Value Date   WBC 7.7 04/30/2019   HGB 18.0 (H) 04/30/2019   HGB 16.5 02/16/2017   HCT 52.7 (H) 04/30/2019   HCT 50.2 02/16/2017   PLT 129 (L) 04/30/2019   PLT 205 02/16/2017   LYMPHOPCT 13 04/30/2019   MONOPCT 13 04/30/2019   EOSPCT 1 04/30/2019   BASOPCT 1 04/30/2019   CMP:  Lab Results  Component Value Date   NA 137 05/02/2019   NA 142 04/13/2018   NA 142 02/26/2015   K 3.6 05/02/2019   K 4.4 02/26/2015   CL 98 05/02/2019   CL 100 02/26/2015   CO2 21 (L) 05/02/2019   CO2 26 02/26/2015   CO2 20 (L) 11/04/2013   BUN 53 (H) 05/02/2019   BUN 15 04/13/2018   BUN 16 02/26/2015   CREATININE 2.82 (H) 05/02/2019   CREATININE 2.04 (H) 11/05/2017   GLU 86 05/12/2014   PROT 6.9 02/16/2018   PROT 5.9 (L) 11/04/2013   ALBUMIN 4.0 05/02/2019   ALBUMIN 3.7 04/13/2018   ALBUMIN 4.5  02/26/2015   ALBUMIN 2.8 (L) 11/04/2013   BILITOT  1.3 (H) 02/16/2018   BILITOT 0.4 11/04/2013   ALKPHOS 247 (H) 02/16/2018   ALKPHOS 120 (H) 11/04/2013   AST 33 02/16/2018   AST 63 (H) 11/04/2013   ALT 35 02/16/2018   ALT 53 11/04/2013  .  Micro Results Recent Results (from the past 240 hour(s))  SARS Coronavirus 2 (CEPHEID - Performed in [place] hospital lab), Hosp Order     Status: None   Collection Time: 04/29/19  4:00 PM  Result Value Ref Range Status   SARS Coronavirus 2 NEGATIVE NEGATIVE Final    Comment: (NOTE) If result is NEGATIVE SARS-CoV-2 target nucleic acids are NOT DETECTED. The SARS-CoV-2 RNA is generally detectable in upper and lower  respiratory specimens during the acute phase of infection. The lowest  concentration of SARS-CoV-2 viral copies this assay can detect is 250  copies / mL. A negative result does not preclude SARS-CoV-2 infection  and should not be used as the sole basis for treatment or other  patient management decisions.  A negative result may occur with  improper specimen collection / handling, submission of specimen other  than nasopharyngeal swab, presence of viral mutation(s) within the  areas targeted by this assay, and inadequate number of viral copies  (<250 copies / mL). A negative result must be combined with clinical  observations, patient history, and epidemiological information. If result is POSITIVE SARS-CoV-2 target nucleic acids are DETECTED. The SARS-CoV-2 RNA is generally detectable in upper and lower  respiratory specimens dur ing the acute phase of infection.  Positive  results are indicative of active infection with SARS-CoV-2.  Clinical  correlation with patient history and other diagnostic information is  necessary to determine patient infection status.  Positive results do  not rule out bacterial infection or co-infection with other viruses. If result is PRESUMPTIVE POSTIVE SARS-CoV-2 nucleic acids MAY BE PRESENT.   A presumptive positive result was obtained on the  submitted specimen  and confirmed on repeat testing.  While 2019 novel coronavirus  (SARS-CoV-2) nucleic acids may be present in the submitted sample  additional confirmatory testing may be necessary for epidemiological  and / or clinical management purposes  to differentiate between  SARS-CoV-2 and other Sarbecovirus currently known to infect humans.  If clinically indicated additional testing with an alternate test  methodology [phone]) is advised. The SARS-CoV-2 RNA is generally  detectable in upper and lower respiratory sp ecimens during the acute  phase of infection. The expected result is Negative. Fact Sheet for Patients:  StrictlyIdeas.no Fact Sheet for Healthcare Providers: BankingDealers.co.za This test is not yet approved or cleared by the [place] FDA and has been authorized for detection and/or diagnosis of SARS-CoV-2 by FDA under an Emergency Use Authorization (EUA).  This EUA will remain in effect (meaning this test can be used) for the duration of the COVID-19 declaration under Section 564(b)(1) of the Act, 21 U.S.C. section 360bbb-3(b)(1), unless the authorization is terminated or revoked sooner. Performed at [facility], [address] [place], [place] [address]         Code Status Orders  (From admission, onward)         Start     Ordered   04/29/19 2201  Full code  Continuous     04/29/19 2200        Code Status History    Date Active Date Inactive Code Status Order ID Comments User Context   01/23/2018  1259 01/31/2018 1424 Full Code [id number]  Gorden Harms, MD Inpatient   01/16/2017 1521 01/19/2017 1503 Full Code [id number]  Vaughan Basta, MD Inpatient   02/25/2016 0058 02/25/2016 2016 Full Code [id number]  Lance Coon, MD ED          Follow-up Information    [place] Follow up on 05/23/19.   Specialty:  Cardiology Why:  at  12:00pm Contact information: [place] [address] [place] [phone]       Birdie Sons, MD On 05/16/2019.   Specialty:  Family Medicine Why:  appointment at [facility] information: [address] [place] [address] [phone]        Lavonia Dana, MD On May 23, 2019.   Specialty:  Internal Medicine Why:  ckd...Marland KitchenMarland Kitchenappointment at 10:20am Contact information: [place] [address] [phone]        Yolonda Kida, MD On 05/10/2019.   Specialties:  Cardiology, Internal Medicine Why:  appointment at 2:30pm Contact information: [place] [address] [phone]        Ottie Glazier, MD On 05-23-2019.   Specialty:  Pulmonary Disease Why:  copd eval......[facility]land [facility]appointment [facility] information: [place] [address] [phone]           Discharge Medications   Allergies as of 05/02/2019      Reactions   Spironolactone Other (See Comments)   Hyperkalemia   Entresto [sacubitril-valsartan] Diarrhea   Ziac [bisoprolol-hydrochlorothiazide]       Medication List    STOP taking these medications   furosemide 40 MG tablet Commonly known as:  LASIX   metoprolol succinate 50 MG 24 hr tablet Commonly known as:  TOPROL-XL     TAKE these medications   amiodarone 200 MG tablet Commonly known as:  PACERONE Take 200 mg by mouth See admin instructions. Take 1 tablet (200mg ) by mouth twice daily for 2 weeks then take 1 tablet (200mg ) by mouth daily   aspirin 81 MG EC tablet Take 81 mg by mouth daily.   atorvastatin 80 MG tablet Commonly known as:  LIPITOR TAKE 1 TABLET EVERY DAY   budesonide 0.25 MG/2ML nebulizer solution Commonly known as:  PULMICORT Take 2 mLs (0.25 mg total) by nebulization 2 (two) times daily.   diltiazem 120 MG 24 hr capsule Commonly known as:  CARDIZEM CD Take 1 capsule (120 mg total) by  mouth daily.   levothyroxine 25 MCG tablet Commonly known as:  SYNTHROID Take 1 tablet by month in the mornings and 1 tablet by mouth in the evenings.   lisinopril 20 MG tablet Commonly known as:  ZESTRIL TAKE 1 TABLET EVERY DAY   predniSONE 10 MG (21) Tbpk tablet Commonly known as:  STERAPRED UNI-PAK 21 TAB Start at 60mg  taper by 10mg  until complete   torsemide 20 MG tablet Commonly known as:  DEMADEX Take 2 tablets (40 mg total) by mouth 2 (two) times daily.   traMADol 50 MG tablet Commonly known as:  ULTRAM Take 100 mg by mouth every 12 (twelve) hours as needed for moderate pain.            Durable Medical Equipment  (From admission, onward)         Start     Ordered   05/02/19 1057  For home use only DME Nebulizer machine  Once    Question:  Patient needs a nebulizer  to treat with the following condition  Answer:  COPD (chronic obstructive pulmonary disease) ([place])   05/02/19 1057             Total Time in preparing paper work, data evaluation and todays exam - 43 minutes  Dustin Flock M.D on 05/02/2019 at 2:29 [place]  [phone]

## 2019-05-02 NOTE — Plan of Care (Signed)
Nutrition Education Note  RD consulted for nutrition education regarding CHF.  Met with patient at bedside. He reports he has a good appetite and intake at baseline. He learned about low sodium diet on last admission but reports he has not been following it at home yet. He did stop adding salt at the table onto food. He reports he eats 2-3 meals per day. For breakfast he has eggs with sausage or bacon and occasionally Spam (though he has reported he has cut down on Spam intake since he knows it is high in salt). Other meals may be sandwich or meat with sides. Discussed some of the other foods in patients diet that are also high in sodium. Reviewed lower-sodium alternatives and encouraged patient to try them. Reviewed ordered fluid restriction of 1.5 liters. Patient reports he has a digital scale at home. He had stopped weighing himself but reports he is going to start again and will be recording weights in a calendar.  RD provided "Heart Failure Nutrition Therapy" handout from the Academy of Nutrition and Dietetics. Reviewed patient's dietary recall. Provided examples on ways to decrease sodium intake in diet. Discouraged intake of processed foods and use of salt shaker. Encouraged fresh fruits and vegetables as well as whole grain sources of carbohydrates to maximize fiber intake.   RD discussed why it is important for patient to adhere to diet recommendations, and emphasized the role of fluids, foods to avoid, and importance of weighing self daily. Teach back method used.  Expect poor to fair compliance.  Body mass index is 26.09 kg/m. Pt meets criteria for overweight based on current BMI.  Current diet order is 2 gram sodium. Labs and medications reviewed. No further nutrition interventions warranted at this time. RD contact information provided. If additional nutrition issues arise, please re-consult RD.   Willey Blade, MS, [place], LDN Office: [phone] Pager: [phone] After  Hours/Weekend Pager: [phone]

## 2019-05-02 NOTE — Care Management Important Message (Signed)
Important Message  Patient Details  Name: Chris Nguyen MRN: [id number] Date of Birth: 1951/11/29   Medicare Important Message Given:  Yes    Dannette Barbara 05/02/2019, 11:34 AM

## 2019-05-02 NOTE — TOC Transition Note (Signed)
Transition of Care [facility]) - CM/SW Discharge Note   Patient Details  Name: Chris Nguyen MRN: [id number] Date of Birth: 1951-02-14  Transition of Care [facility] CM/SW Contact:  Elza Rafter, RN Phone Number: 05/02/2019, 1:45 PM   Clinical Narrative:   Discharging to home today.  Independent in all ADL's.  Current with PCP; obtains medications at [facility] on [address] without difficulty.  Uses a cane.  Referral with [facility] for nebulizer to take home; delivered to room. No further needs identified.      Final next level of care: Home/Self Care Barriers to Discharge: No Barriers Identified   Patient Goals and CMS Choice        Discharge Placement                       Discharge Plan and Services   Discharge Planning Services: CM Consult, HF Clinic            DME Arranged: Nebulizer/meds DME Agency: [employer] Date DME Agency Contacted: 05/02/19 Time DME Agency Contacted: 1200 Representative spoke with at [place]: [place] ([place]) Interventions     Readmission Risk Interventions Readmission Risk Prevention Plan 05/02/2019  Transportation Screening Complete  PCP or Specialist Appt within 5-7 Days Complete  Home Care Screening Complete  Medication Review (RN CM) Complete  Some recent data might be hidden

## 2019-05-02 NOTE — Progress Notes (Signed)
[facility]  ROUNDING NOTE   Subjective:   Feels fair No acute c/o S Creatinine slightly higher at 2.82 but fluctuating States he is able to eat without problems   Objective:  Vital signs in last 24 hours:  Temp:  [97.5 F (36.4 C)-98.1 F (36.7 C)] 97.5 F (36.4 C) (06/01 0411) Pulse Rate:  [55-107] 106 (06/01 0856) Resp:  [16-20] 18 (06/01 0856) BP: (97-137)/(86-110) 119/104 (06/01 0856) SpO2:  [82 %-100 %] 96 % (06/01 0856) Weight:  [82.5 kg] 82.5 kg (06/01 0411)  Weight change: -0.998 kg Filed Weights   04/30/19 0523 05/01/19 0432 05/02/19 0411  Weight: 83.3 kg 83.5 kg 82.5 kg    Intake/Output: I/O last 3 completed shifts: In: -  Out: 630 [Urine:630]   Intake/Output this shift:  Total I/O In: 240 [P.O.:240] Out: 100 [Urine:100]  Physical Exam: General: NAD,   Head: Normocephalic, atraumatic. Moist oral mucosal membranes  Eyes: Anicteric,    Neck: Supple, trachea midline  Lungs:  Clear to auscultation  Heart: irregular  Abdomen:  Soft, nontender,   Extremities: no peripheral edema.  Neurologic: Nonfocal, moving all four extremities       Basic Metabolic Panel: Recent Labs  Lab 04/29/19 1451 04/30/19 0416 05/01/19 0456 05/02/19 0253  NA 136 137 137 137  K 3.2* 3.4* 3.7 3.6  CL 100 100 99 98  CO2 22 23 23  21*  GLUCOSE 103* 123* 94 128*  BUN 44* 46* 48* 53*  CREATININE 2.68* 2.73* 2.55* 2.82*  CALCIUM 9.0 9.1 9.2 9.6  PHOS  --   --   --  3.9    Liver Function Tests: Recent Labs  Lab 05/02/19 0253  ALBUMIN 4.0   No results for input(s): LIPASE, AMYLASE in the last 168 hours. No results for input(s): AMMONIA in the last 168 hours.  CBC: Recent Labs  Lab 04/29/19 1451 04/30/19 0416  WBC 8.0 7.7  NEUTROABS  --  5.6  HGB 17.6* 18.0*  HCT 51.8 52.7*  MCV 90.6 92.1  PLT 132* 129*    Cardiac Enzymes: Recent Labs  Lab 04/29/19 1451 04/29/19 2205 04/30/19 0416 04/30/19 0954  TROPONINI 0.15* 7.69* 0.14* 0.14*     BNP: Invalid input(s): POCBNP  CBG: No results for input(s): GLUCAP in the last 168 hours.  Microbiology: Results for orders placed or performed during the hospital encounter of 04/29/19  SARS Coronavirus 2 (CEPHEID - Performed in [facility] hospital lab), Hosp Order     Status: None   Collection Time: 04/29/19  4:00 PM  Result Value Ref Range Status   SARS Coronavirus 2 NEGATIVE NEGATIVE Final    Comment: (NOTE) If result is NEGATIVE SARS-CoV-2 target nucleic acids are NOT DETECTED. The SARS-CoV-2 RNA is generally detectable in upper and lower  respiratory specimens during the acute phase of infection. The lowest  concentration of SARS-CoV-2 viral copies this assay can detect is 250  copies / mL. A negative result does not preclude SARS-CoV-2 infection  and should not be used as the sole basis for treatment or other  patient management decisions.  A negative result may occur with  improper specimen collection / handling, submission of specimen other  than nasopharyngeal swab, presence of viral mutation(s) within the  areas targeted by this assay, and inadequate number of viral copies  (<250 copies / mL). A negative result must be combined with clinical  observations, patient history, and epidemiological information. If result is POSITIVE SARS-CoV-2 target nucleic acids are DETECTED. The SARS-CoV-2  RNA is generally detectable in upper and lower  respiratory specimens dur ing the acute phase of infection.  Positive  results are indicative of active infection with SARS-CoV-2.  Clinical  correlation with patient history and other diagnostic information is  necessary to determine patient infection status.  Positive results do  not rule out bacterial infection or co-infection with other viruses. If result is PRESUMPTIVE POSTIVE SARS-CoV-2 nucleic acids MAY BE PRESENT.   A presumptive positive result was obtained on the submitted specimen  and confirmed on repeat testing.   While 2019 novel coronavirus  (SARS-CoV-2) nucleic acids may be present in the submitted sample  additional confirmatory testing may be necessary for epidemiological  and / or clinical management purposes  to differentiate between  SARS-CoV-2 and other Sarbecovirus currently known to infect humans.  If clinically indicated additional testing with an alternate test  methodology [phone]) is advised. The SARS-CoV-2 RNA is generally  detectable in upper and lower respiratory sp ecimens during the acute  phase of infection. The expected result is Negative. Fact Sheet for Patients:  StrictlyIdeas.no Fact Sheet for Healthcare Providers: BankingDealers.co.za This test is not yet approved or cleared by the [place] FDA and has been authorized for detection and/or diagnosis of SARS-CoV-2 by FDA under an Emergency Use Authorization (EUA).  This EUA will remain in effect (meaning this test can be used) for the duration of the COVID-19 declaration under Section 564(b)(1) of the Act, 21 U.S.C. section 360bbb-3(b)(1), unless the authorization is terminated or revoked sooner. Performed at [facility], [address] [place], [place] [address]     Coagulation Studies: Recent Labs    04/29/19 2318  LABPROT 16.4*  INR 1.3*    Urinalysis: No results for input(s): COLORURINE, LABSPEC, PHURINE, GLUCOSEU, HGBUR, BILIRUBINUR, KETONESUR, PROTEINUR, UROBILINOGEN, NITRITE, LEUKOCYTESUR in the last 72 hours.  Invalid input(s): APPERANCEUR    Imaging: Ct Abdomen Pelvis Wo Contrast  Result Date: 04/30/2019 CLINICAL DATA:  Heart failure short of breath abdominal fullness EXAM: CT CHEST, ABDOMEN AND PELVIS WITHOUT CONTRAST TECHNIQUE: Multidetector CT imaging of the chest, abdomen and pelvis was performed following the standard protocol without IV contrast. COMPARISON:  Ultrasound 04/30/2019, chest x-ray 04/29/2019 FINDINGS: CT CHEST FINDINGS  Cardiovascular: Moderate aortic atherosclerosis without aneurysm. Extensive coronary vascular calcification. Cardiomegaly. No significant pericardial effusion. Post CABG changes. Left-sided cardiac pacing device with single lead tip at the right ventricle. Mediastinum/Nodes: Midline trachea. No thyroid mass. No significant adenopathy. Esophagus shows mild air distention but is otherwise unremarkable Lungs/Pleura: Moderate emphysema. Trace left pleural effusion. Small moderate right pleural effusion. No focal consolidation. 3 mm subpleural left upper lobe lung nodule, series 4, image number 74. Musculoskeletal: Post sternotomy changes. No acute or suspicious osseous lesion CT ABDOMEN PELVIS FINDINGS Hepatobiliary: No focal hepatic abnormality. Hyperdense sludge or small stones in the gallbladder. No biliary dilatation Pancreas: Unremarkable. No pancreatic ductal dilatation or surrounding inflammatory changes. Spleen: Normal in size without focal abnormality. Adrenals/Urinary Tract: Adrenal glands are normal. No hydronephrosis. Scarring and mild atrophy of the right kidney, most prominent involving the lower pole. The bladder is unremarkable. Stomach/Bowel: Stomach is within normal limits. Appendix appears normal. No evidence of bowel wall thickening, distention, or inflammatory changes. Vascular/Lymphatic: Moderate aortic atherosclerosis. Focal ectasia of the distal infrarenal abdominal aorta measuring up to 2.8 cm. No significantly enlarged lymph nodes Reproductive: Slightly enlarged prostate with scattered calcification Other: No free air. Small amount of free fluid in the pelvis. Trace perihepatic free fluid. Musculoskeletal: Degenerative changes of the  spine with retrolisthesis of L4 on L5. No acute or suspicious osseous abnormality. IMPRESSION: 1. Cardiomegaly with trace left pleural effusion and small moderate right pleural effusion. No focal airspace consolidation. 2. Moderate emphysema. 3 mm left upper lobe  lung nodule. No follow-up needed if patient is low-risk. Non-contrast chest CT can be considered in 12 months if patient is high-risk. This recommendation follows the consensus statement: Guidelines for Management of Incidental Pulmonary Nodules Detected on CT Images: From the Fleischner Society 2017; Radiology 2017; 284:228-243. 3. Small amount of free fluid in the pelvis and adjacent to the liver. 4. Gallbladder sludge versus small stones without acute inflammatory change 5. Scarring and atrophy of right kidney 6. Distal infrarenal abdominal aorta measures up to 2.8 cm. Ectatic abdominal aorta at risk for aneurysm development. Recommend followup by ultrasound in 5 years. This recommendation follows ACR consensus guidelines: White Paper of the ACR Incidental Findings Committee II on Vascular Findings. J Am Coll Radiol 2013; 10:789-794. Electronically Signed   By: Donavan Foil M.D.   On: 04/30/2019 19:23   Ct Chest Wo Contrast  Result Date: 04/30/2019 CLINICAL DATA:  Heart failure short of breath abdominal fullness EXAM: CT CHEST, ABDOMEN AND PELVIS WITHOUT CONTRAST TECHNIQUE: Multidetector CT imaging of the chest, abdomen and pelvis was performed following the standard protocol without IV contrast. COMPARISON:  Ultrasound 04/30/2019, chest x-ray 04/29/2019 FINDINGS: CT CHEST FINDINGS Cardiovascular: Moderate aortic atherosclerosis without aneurysm. Extensive coronary vascular calcification. Cardiomegaly. No significant pericardial effusion. Post CABG changes. Left-sided cardiac pacing device with single lead tip at the right ventricle. Mediastinum/Nodes: Midline trachea. No thyroid mass. No significant adenopathy. Esophagus shows mild air distention but is otherwise unremarkable Lungs/Pleura: Moderate emphysema. Trace left pleural effusion. Small moderate right pleural effusion. No focal consolidation. 3 mm subpleural left upper lobe lung nodule, series 4, image number 74. Musculoskeletal: Post sternotomy  changes. No acute or suspicious osseous lesion CT ABDOMEN PELVIS FINDINGS Hepatobiliary: No focal hepatic abnormality. Hyperdense sludge or small stones in the gallbladder. No biliary dilatation Pancreas: Unremarkable. No pancreatic ductal dilatation or surrounding inflammatory changes. Spleen: Normal in size without focal abnormality. Adrenals/Urinary Tract: Adrenal glands are normal. No hydronephrosis. Scarring and mild atrophy of the right kidney, most prominent involving the lower pole. The bladder is unremarkable. Stomach/Bowel: Stomach is within normal limits. Appendix appears normal. No evidence of bowel wall thickening, distention, or inflammatory changes. Vascular/Lymphatic: Moderate aortic atherosclerosis. Focal ectasia of the distal infrarenal abdominal aorta measuring up to 2.8 cm. No significantly enlarged lymph nodes Reproductive: Slightly enlarged prostate with scattered calcification Other: No free air. Small amount of free fluid in the pelvis. Trace perihepatic free fluid. Musculoskeletal: Degenerative changes of the spine with retrolisthesis of L4 on L5. No acute or suspicious osseous abnormality. IMPRESSION: 1. Cardiomegaly with trace left pleural effusion and small moderate right pleural effusion. No focal airspace consolidation. 2. Moderate emphysema. 3 mm left upper lobe lung nodule. No follow-up needed if patient is low-risk. Non-contrast chest CT can be considered in 12 months if patient is high-risk. This recommendation follows the consensus statement: Guidelines for Management of Incidental Pulmonary Nodules Detected on CT Images: From the Fleischner Society 2017; Radiology 2017; 284:228-243. 3. Small amount of free fluid in the pelvis and adjacent to the liver. 4. Gallbladder sludge versus small stones without acute inflammatory change 5. Scarring and atrophy of right kidney 6. Distal infrarenal abdominal aorta measures up to 2.8 cm. Ectatic abdominal aorta at risk for aneurysm  development. Recommend followup by ultrasound in 5  years. This recommendation follows ACR consensus guidelines: White Paper of the ACR Incidental Findings Committee II on Vascular Findings. J Am Coll Radiol 2013; 10:789-794. Electronically Signed   By: Donavan Foil M.D.   On: 04/30/2019 19:23     Medications:   . sodium chloride     . amiodarone  200 mg Oral BID  . aspirin EC  81 mg Oral Daily  . budesonide (PULMICORT) nebulizer solution  0.25 mg Nebulization BID  . diltiazem  120 mg Oral Daily  . heparin injection (subcutaneous)  5,000 Units Subcutaneous Q8H  . levalbuterol  1.25 mg Nebulization Q6H  . sodium chloride flush  3 mL Intravenous Q12H   sodium chloride, acetaminophen, alum & mag hydroxide-simeth, ondansetron (ZOFRAN) IV, sodium chloride flush, traMADol  Assessment/ Plan:  [name] is a 68 y.o. black male with hypertension, congestive heart failure, atrial fibrillation, hyperlipidemia, coronary artery disease status post CABG, AICD, BPH, mitral valve repair, who was admitted to [facility] on5/29/2020 for SOB (shortness of breath) [R06.02] Elevated troponin [R79.89] Acute on chronic congestive heart failure, unspecified heart failure type ([place]) [I50.9]  1. Acute renal failure on chronic kidney disease stage III: baseline creatinine of 1.53, GFR of 54 on 04/13/18 With proteinuria Acute renal failure due to acute cardiorenal syndrome Chronic kidney disease secondary to hypertension Rt renal atrophy noted on CT abdomen Will arrange for outpatient follow up  2.  Acute exacerbation of chronic systolic and diastolic congestive heart failure. EF 25%  - maintenance diuretics      LOS: 3 Chris Nguyen 6/1/202010:33 AM

## 2019-05-03 ENCOUNTER — Telehealth: Payer: Self-pay

## 2019-05-03 ENCOUNTER — Other Ambulatory Visit: Payer: Self-pay

## 2019-05-03 MED ORDER — LEVOTHYROXINE SODIUM 25 MCG PO TABS: ORAL_TABLET | ORAL | 2 refills | Status: AC

## 2019-05-03 NOTE — Telephone Encounter (Signed)
Prednisone can aggravate swelling. Should take one extra torsemide tablets today and tomorrow. Call if not improving in  A day or two.

## 2019-05-03 NOTE — Telephone Encounter (Signed)
HFU scheduled for 05/16/19 @ 10 AM.

## 2019-05-03 NOTE — Telephone Encounter (Signed)
Transition Care Management Follow-up Telephone Call  Date of discharge and from where: [facility] on 05/02/19.  How have you been since you were released from the hospital? Pt states he is not doing any better and is still retaining fluid in his stomach and ankles. Pt is having to make himself urinate. The fluid build up is affecting his energy to walk and causes SOB which affects him talking. Pt is currently taking torsemide 20 mg two tablets BID. Pt is unable to lay down due to the fluid build up so he is having to sleep sitting up. Pt is having mid back pain on the right side. Pain level is currently a 5. Pain stops with pt stops moving. Pt is also having loose stools. Pt states this has been going on for about 10 days. Current weight is 187.5 lbs with clothes on. Weight after admission was 181.8 lbs. Declines fever or n/v.   Any questions or concerns? Yes, see above. Pt did not receive the new prescription of Levothyroxine and needs it called into the [facility].  Items Reviewed:  Did the pt receive and understand the discharge instructions provided? Yes   Medications obtained and verified? Pt declined reviewing other meds at this time.   Any new allergies since your discharge? No   Dietary orders reviewed? Yes  Do you have support at home? Yes   Other (ie: DME, Home Health, etc) Pt was d/c with a nebulizer.   Functional Questionnaire: (I = Independent and D = Dependent)  Bathing/Dressing- I   Meal Prep- I  Eating- I  Maintaining continence- I  Transferring/Ambulation- Uses a cane as needed for assistance walking.   Managing Meds- I   Follow up appointments reviewed:    PCP Hospital f/u appt confirmed? Yes  Scheduled to see Dr Caryn Section on 05/16/19 @ 10:00 AM.  [facility] f/u appt confirmed? Yes    Are transportation arrangements needed? No   If their condition worsens, is the pt aware to call  their PCP or go to the ED? Yes  Was the patient provided with  contact information for the PCP's office or ED? Yes  Was the pt encouraged to call back with questions or concerns? Yes

## 2019-05-03 NOTE — Telephone Encounter (Signed)
Pt advised.  He also needs a refill on Levothyroxine sent to [place].    Thanks,   -Mickel Baas

## 2019-05-10 DIAGNOSIS — Z95810 Presence of automatic (implantable) cardiac defibrillator: Secondary | ICD-10-CM | POA: Diagnosis not present

## 2019-05-10 DIAGNOSIS — I5020 Unspecified systolic (congestive) heart failure: Secondary | ICD-10-CM | POA: Diagnosis not present

## 2019-05-10 DIAGNOSIS — I4891 Unspecified atrial fibrillation: Secondary | ICD-10-CM | POA: Diagnosis not present

## 2019-05-10 DIAGNOSIS — K219 Gastro-esophageal reflux disease without esophagitis: Secondary | ICD-10-CM | POA: Diagnosis not present

## 2019-05-10 DIAGNOSIS — R0602 Shortness of breath: Secondary | ICD-10-CM | POA: Diagnosis not present

## 2019-05-10 DIAGNOSIS — I10 Essential (primary) hypertension: Secondary | ICD-10-CM | POA: Diagnosis not present

## 2019-05-10 DIAGNOSIS — I639 Cerebral infarction, unspecified: Secondary | ICD-10-CM | POA: Diagnosis not present

## 2019-05-10 DIAGNOSIS — Z951 Presence of aortocoronary bypass graft: Secondary | ICD-10-CM | POA: Diagnosis not present

## 2019-05-10 DIAGNOSIS — N183 Chronic kidney disease, stage 3 (moderate): Secondary | ICD-10-CM | POA: Diagnosis not present

## 2019-05-11 ENCOUNTER — Inpatient Hospital Stay
Admission: EM | Admit: 2019-05-11 | Discharge: 2019-06-01 | DRG: 871 | Disposition: E | Payer: Medicare HMO | Attending: Internal Medicine | Admitting: Internal Medicine

## 2019-05-11 ENCOUNTER — Other Ambulatory Visit: Payer: Self-pay

## 2019-05-11 DIAGNOSIS — R110 Nausea: Secondary | ICD-10-CM | POA: Diagnosis not present

## 2019-05-11 DIAGNOSIS — J811 Chronic pulmonary edema: Secondary | ICD-10-CM | POA: Diagnosis not present

## 2019-05-11 DIAGNOSIS — I5043 Acute on chronic combined systolic (congestive) and diastolic (congestive) heart failure: Secondary | ICD-10-CM | POA: Diagnosis present

## 2019-05-11 DIAGNOSIS — I255 Ischemic cardiomyopathy: Secondary | ICD-10-CM | POA: Diagnosis present

## 2019-05-11 DIAGNOSIS — Z8674 Personal history of sudden cardiac arrest: Secondary | ICD-10-CM

## 2019-05-11 DIAGNOSIS — K766 Portal hypertension: Secondary | ICD-10-CM | POA: Diagnosis present

## 2019-05-11 DIAGNOSIS — N400 Enlarged prostate without lower urinary tract symptoms: Secondary | ICD-10-CM | POA: Diagnosis present

## 2019-05-11 DIAGNOSIS — N179 Acute kidney failure, unspecified: Secondary | ICD-10-CM | POA: Diagnosis present

## 2019-05-11 DIAGNOSIS — I959 Hypotension, unspecified: Secondary | ICD-10-CM | POA: Diagnosis present

## 2019-05-11 DIAGNOSIS — R578 Other shock: Secondary | ICD-10-CM

## 2019-05-11 DIAGNOSIS — Z951 Presence of aortocoronary bypass graft: Secondary | ICD-10-CM

## 2019-05-11 DIAGNOSIS — I10 Essential (primary) hypertension: Secondary | ICD-10-CM | POA: Diagnosis not present

## 2019-05-11 DIAGNOSIS — J9600 Acute respiratory failure, unspecified whether with hypoxia or hypercapnia: Secondary | ICD-10-CM

## 2019-05-11 DIAGNOSIS — M19032 Primary osteoarthritis, left wrist: Secondary | ICD-10-CM | POA: Diagnosis present

## 2019-05-11 DIAGNOSIS — R6521 Severe sepsis with septic shock: Secondary | ICD-10-CM | POA: Diagnosis present

## 2019-05-11 DIAGNOSIS — E039 Hypothyroidism, unspecified: Secondary | ICD-10-CM | POA: Diagnosis present

## 2019-05-11 DIAGNOSIS — I8500 Esophageal varices without bleeding: Secondary | ICD-10-CM | POA: Diagnosis present

## 2019-05-11 DIAGNOSIS — Z95810 Presence of automatic (implantable) cardiac defibrillator: Secondary | ICD-10-CM

## 2019-05-11 DIAGNOSIS — Z888 Allergy status to other drugs, medicaments and biological substances status: Secondary | ICD-10-CM

## 2019-05-11 DIAGNOSIS — K2981 Duodenitis with bleeding: Secondary | ICD-10-CM | POA: Diagnosis not present

## 2019-05-11 DIAGNOSIS — E785 Hyperlipidemia, unspecified: Secondary | ICD-10-CM | POA: Diagnosis present

## 2019-05-11 DIAGNOSIS — I517 Cardiomegaly: Secondary | ICD-10-CM | POA: Diagnosis not present

## 2019-05-11 DIAGNOSIS — K920 Hematemesis: Secondary | ICD-10-CM | POA: Diagnosis present

## 2019-05-11 DIAGNOSIS — R0602 Shortness of breath: Secondary | ICD-10-CM | POA: Diagnosis not present

## 2019-05-11 DIAGNOSIS — N189 Chronic kidney disease, unspecified: Secondary | ICD-10-CM | POA: Diagnosis present

## 2019-05-11 DIAGNOSIS — J9602 Acute respiratory failure with hypercapnia: Secondary | ICD-10-CM | POA: Diagnosis present

## 2019-05-11 DIAGNOSIS — J810 Acute pulmonary edema: Secondary | ICD-10-CM | POA: Diagnosis not present

## 2019-05-11 DIAGNOSIS — J9601 Acute respiratory failure with hypoxia: Secondary | ICD-10-CM | POA: Diagnosis present

## 2019-05-11 DIAGNOSIS — R680 Hypothermia, not associated with low environmental temperature: Secondary | ICD-10-CM | POA: Diagnosis not present

## 2019-05-11 DIAGNOSIS — K8020 Calculus of gallbladder without cholecystitis without obstruction: Secondary | ICD-10-CM | POA: Diagnosis present

## 2019-05-11 DIAGNOSIS — I2510 Atherosclerotic heart disease of native coronary artery without angina pectoris: Secondary | ICD-10-CM | POA: Diagnosis present

## 2019-05-11 DIAGNOSIS — K449 Diaphragmatic hernia without obstruction or gangrene: Secondary | ICD-10-CM | POA: Diagnosis not present

## 2019-05-11 DIAGNOSIS — K264 Chronic or unspecified duodenal ulcer with hemorrhage: Secondary | ICD-10-CM | POA: Diagnosis present

## 2019-05-11 DIAGNOSIS — A419 Sepsis, unspecified organism: Secondary | ICD-10-CM | POA: Diagnosis present

## 2019-05-11 DIAGNOSIS — Z03818 Encounter for observation for suspected exposure to other biological agents ruled out: Secondary | ICD-10-CM | POA: Diagnosis not present

## 2019-05-11 DIAGNOSIS — N183 Chronic kidney disease, stage 3 (moderate): Secondary | ICD-10-CM | POA: Diagnosis present

## 2019-05-11 DIAGNOSIS — Z79899 Other long term (current) drug therapy: Secondary | ICD-10-CM

## 2019-05-11 DIAGNOSIS — Z8249 Family history of ischemic heart disease and other diseases of the circulatory system: Secondary | ICD-10-CM

## 2019-05-11 DIAGNOSIS — R0902 Hypoxemia: Secondary | ICD-10-CM | POA: Diagnosis not present

## 2019-05-11 DIAGNOSIS — D62 Acute posthemorrhagic anemia: Secondary | ICD-10-CM | POA: Diagnosis present

## 2019-05-11 DIAGNOSIS — I480 Paroxysmal atrial fibrillation: Secondary | ICD-10-CM | POA: Diagnosis present

## 2019-05-11 DIAGNOSIS — N172 Acute kidney failure with medullary necrosis: Secondary | ICD-10-CM | POA: Diagnosis not present

## 2019-05-11 DIAGNOSIS — I119 Hypertensive heart disease without heart failure: Secondary | ICD-10-CM | POA: Diagnosis present

## 2019-05-11 DIAGNOSIS — R0689 Other abnormalities of breathing: Secondary | ICD-10-CM | POA: Diagnosis not present

## 2019-05-11 DIAGNOSIS — Z66 Do not resuscitate: Secondary | ICD-10-CM | POA: Diagnosis not present

## 2019-05-11 DIAGNOSIS — K219 Gastro-esophageal reflux disease without esophagitis: Secondary | ICD-10-CM | POA: Diagnosis present

## 2019-05-11 DIAGNOSIS — R570 Cardiogenic shock: Secondary | ICD-10-CM | POA: Diagnosis present

## 2019-05-11 DIAGNOSIS — Z978 Presence of other specified devices: Secondary | ICD-10-CM

## 2019-05-11 DIAGNOSIS — I130 Hypertensive heart and chronic kidney disease with heart failure and stage 1 through stage 4 chronic kidney disease, or unspecified chronic kidney disease: Secondary | ICD-10-CM | POA: Diagnosis not present

## 2019-05-11 DIAGNOSIS — K922 Gastrointestinal hemorrhage, unspecified: Secondary | ICD-10-CM | POA: Diagnosis not present

## 2019-05-11 DIAGNOSIS — Z818 Family history of other mental and behavioral disorders: Secondary | ICD-10-CM

## 2019-05-11 DIAGNOSIS — Z7951 Long term (current) use of inhaled steroids: Secondary | ICD-10-CM

## 2019-05-11 DIAGNOSIS — Z20828 Contact with and (suspected) exposure to other viral communicable diseases: Secondary | ICD-10-CM | POA: Diagnosis present

## 2019-05-11 DIAGNOSIS — K3189 Other diseases of stomach and duodenum: Secondary | ICD-10-CM | POA: Diagnosis present

## 2019-05-11 DIAGNOSIS — Z4689 Encounter for fitting and adjustment of other specified devices: Secondary | ICD-10-CM | POA: Diagnosis not present

## 2019-05-11 DIAGNOSIS — Z833 Family history of diabetes mellitus: Secondary | ICD-10-CM

## 2019-05-11 DIAGNOSIS — I43 Cardiomyopathy in diseases classified elsewhere: Secondary | ICD-10-CM | POA: Diagnosis present

## 2019-05-11 DIAGNOSIS — Z83511 Family history of glaucoma: Secondary | ICD-10-CM

## 2019-05-11 DIAGNOSIS — Z7901 Long term (current) use of anticoagulants: Secondary | ICD-10-CM

## 2019-05-11 DIAGNOSIS — Z7982 Long term (current) use of aspirin: Secondary | ICD-10-CM

## 2019-05-11 DIAGNOSIS — I509 Heart failure, unspecified: Secondary | ICD-10-CM | POA: Diagnosis not present

## 2019-05-11 DIAGNOSIS — J90 Pleural effusion, not elsewhere classified: Secondary | ICD-10-CM | POA: Diagnosis not present

## 2019-05-11 DIAGNOSIS — Z87891 Personal history of nicotine dependence: Secondary | ICD-10-CM

## 2019-05-11 DIAGNOSIS — I8501 Esophageal varices with bleeding: Secondary | ICD-10-CM | POA: Diagnosis not present

## 2019-05-11 DIAGNOSIS — Z79890 Hormone replacement therapy (postmenopausal): Secondary | ICD-10-CM

## 2019-05-11 DIAGNOSIS — F1010 Alcohol abuse, uncomplicated: Secondary | ICD-10-CM | POA: Diagnosis present

## 2019-05-11 LAB — CBC WITH DIFFERENTIAL/PLATELET
Abs Immature Granulocytes: 0.22 10*3/uL — ABNORMAL HIGH (ref 0.00–0.07)
Basophils Absolute: 0 10*3/uL (ref 0.0–0.1)
Basophils Relative: 0 %
Eosinophils Absolute: 0.1 10*3/uL (ref 0.0–0.5)
Eosinophils Relative: 1 %
HCT: 54.5 % — ABNORMAL HIGH (ref 39.0–52.0)
Hemoglobin: 17.8 g/dL — ABNORMAL HIGH (ref 13.0–17.0)
Immature Granulocytes: 2 %
Lymphocytes Relative: 9 %
Lymphs Abs: 1 10*3/uL (ref 0.7–4.0)
MCH: 31.3 pg (ref 26.0–34.0)
MCHC: 32.7 g/dL (ref 30.0–36.0)
MCV: 96 fL (ref 80.0–100.0)
Monocytes Absolute: 1 10*3/uL (ref 0.1–1.0)
Monocytes Relative: 9 %
Neutro Abs: 9.1 10*3/uL — ABNORMAL HIGH (ref 1.7–7.7)
Neutrophils Relative %: 79 %
Platelets: 159 10*3/uL (ref 150–400)
RBC: 5.68 MIL/uL (ref 4.22–5.81)
RDW: 14.5 % (ref 11.5–15.5)
WBC: 11.5 10*3/uL — ABNORMAL HIGH (ref 4.0–10.5)
nRBC: 0 % (ref 0.0–0.2)

## 2019-05-11 NOTE — ED Notes (Signed)
[place] of tubes sent to lab. Pt denies dizziness/nausea/etc currently.

## 2019-05-11 NOTE — ED Provider Notes (Signed)
[facility] Emergency Department Provider Note    First MD Initiated Contact with Patient 05/09/2019 2347     (approximate)  I have reviewed the triage vital signs and the nursing notes.   HISTORY  Chief Complaint Shortness of Breath, Weakness, and Hematemesis    HPI Chris Nguyen is a 68 y.o. male with below list of previous medical conditions presents to the emergency department presents to the emergency department secondary to "vomiting blood this evening.  Patient admits to dyspnea generalized weakness as well.  Patient denies any chest pain no shortness of breath.  Per EMS patient's initial blood pressure 87/52.  Of note the patient is currently taking Eliquis which he is only taken 1 dose.  Patient also admits to generalized abdominal discomfort.  Patient does admit to remote history of heavy alcohol use      Past Medical History:  Diagnosis Date  . AICD (automatic cardioverter/defibrillator) present   . BPH (benign prostatic hyperplasia)   . CAD (coronary artery disease) 05/14/2006  . Cardiac arrest ([place]) 11/12/2013   Overview:  December 2014: ICD implanted  Last Assessment & Plan:  Relevant Hx: Course: Daily Update: Today's Plan:   . Chronic systolic CHF (congestive heart failure) ([place])   . Erectile dysfunction   . H/O coronary artery bypass surgery 05/17/2015   Overview:  LIMA to LAD   . HLD (hyperlipidemia)   . Osteoarthritis of left wrist   . Paroxysmal atrial fibrillation [facility])     Patient Active Problem List   Diagnosis Date Noted  . Acute on chronic renal failure ([place]) May 15, 2019  . Hematemesis May 15, 2019  . Hypotension 2019-05-15  . Acute on chronic combined systolic and diastolic CHF (congestive heart failure) ([place]) 04/29/2019  . CHF (congestive heart failure) ([place]) 01/23/2018  . Hypothyroid 01/19/2018  . Allergic rhinitis 11/05/2017  . HLD (hyperlipidemia) 02/25/2016  . Acute on chronic systolic CHF (congestive heart failure)  ([place]) 02/24/2016  . Chronic kidney disease (CKD), stage III (moderate) ([place]) 05/17/2015  . Acid reflux 05/17/2015  . Heart failure, systolic ([place]) 62/83/6629  . Hyperlipidemia   . Hypertension   . Hypertensive cardiomyopathy ([place])   . Cardiomyopathy ([place])   . Foot pain, left   . BPH (benign prostatic hyperplasia)   . Erectile dysfunction   . Left leg weakness   . Encounter for adjustment or management of automatic implantable cardioverter-defibrillator 02/07/2014  . Paroxysmal a-fib ([place]) 12/26/2013  . Automatic implantable cardioverter-defibrillator in situ 12/15/2013  . Osteoarthritis of left wrist 09/27/2010  . CAD (coronary artery disease) 05/14/2006    Past Surgical History:  Procedure Laterality Date  . CARDIAC CATHETERIZATION  06/27/2008  . CARPAL TUNNEL RELEASE    . CORONARY ARTERY BYPASS GRAFT  2006 and 2014   LIMA to LAD at [facility] 2014  . MITRAL VALVE REPAIR      Prior to Admission medications   Medication Sig Start Date End Date Taking? Authorizing Provider  apixaban (ELIQUIS) 2.5 MG TABS tablet Take 2.5 mg by mouth 2 (two) times daily.   Yes [provider]  budesonide (PULMICORT) 0.25 MG/2ML nebulizer solution Take 2 mLs (0.25 mg total) by nebulization 2 (two) times daily. 05/02/19  Yes Dustin Flock, MD  diltiazem (CARDIZEM CD) 120 MG 24 hr capsule Take 1 capsule (120 mg total) by mouth daily. 05/02/19  Yes Dustin Flock, MD  levothyroxine (SYNTHROID) 25 MCG tablet Take 1 tablet by month in the mornings and 1 tablet by mouth in the evenings.  05/03/19  Yes Birdie Sons, MD  lisinopril (PRINIVIL,ZESTRIL) 20 MG tablet TAKE 1 TABLET EVERY DAY Patient taking differently: Take 20 mg by mouth daily.  05/13/18  Yes Birdie Sons, MD  sotalol (BETAPACE) 80 MG tablet Take 80 mg by mouth 2 (two) times daily.   Yes [provider]  torsemide (DEMADEX) 20 MG tablet Take 2 tablets (40 mg total) by mouth 2 (two) times daily. 05/02/19  Yes Dustin Flock, MD   amiodarone (PACERONE) 200 MG tablet Take 200 mg by mouth See admin instructions. Take 1 tablet (200mg ) by mouth twice daily for 2 weeks then take 1 tablet (200mg ) by mouth daily 04/26/19   [provider]  aspirin 81 MG EC tablet Take 81 mg by mouth daily.     [provider]  atorvastatin (LIPITOR) 80 MG tablet TAKE 1 TABLET EVERY DAY Patient not taking: Reported on 04/29/2019 02/05/16   Birdie Sons, MD  predniSONE (STERAPRED UNI-PAK 21 TAB) 10 MG (21) TBPK tablet Start at 60mg  taper by 10mg  until complete Patient not taking: Reported on 05-18-19 05/02/19   Dustin Flock, MD  traMADol (ULTRAM) 50 MG tablet Take 100 mg by mouth every 12 (twelve) hours as needed for moderate pain.    [provider]    Allergies Spironolactone, Entresto [sacubitril-valsartan], and Ziac [bisoprolol-hydrochlorothiazide]  Family History  Problem Relation Age of Onset  . CAD Mother 53  . Diabetes Mother        Diabetes Mellitus  . Dementia Father   . Glaucoma Father   . Heart disease Other     Social History Social History   Tobacco Use  . Smoking status: Former Smoker    Quit date: 12/01/2004    Years since quitting: 14.4  . Smokeless tobacco: Never Used  Substance Use Topics  . Alcohol use: Yes    Alcohol/week: 7.0 - 9.0 standard drinks    Types: 1 Glasses of wine, 6 - 8 Cans of beer per week  . Drug use: No    Review of Systems Constitutional: No fever/chills Eyes: No visual changes. ENT: No sore throat. Cardiovascular: Denies chest pain. Respiratory: Denies shortness of breath. Gastrointestinal: No abdominal pain.  No nausea, no vomiting.  No diarrhea.  No constipation. Genitourinary: Negative for dysuria. Musculoskeletal: Negative for neck pain.  Negative for back pain. Integumentary: Negative for rash. Neurological: Negative for headaches, focal weakness or numbness.   ____________________________________________   PHYSICAL EXAM:  VITAL SIGNS: ED  Triage Vitals  Enc Vitals Group     BP 05/24/2019 2334 (!) 68/58     Pulse Rate 05/14/2019 2334 82     Resp 05/10/2019 2335 (!) 22     Temp 05/10/2019 2330 98.2 F (36.8 C)     Temp Source 05/29/2019 2330 Oral     SpO2 05/31/2019 2334 96 %     Weight 05/30/2019 2331 83 kg (183 lb)     Height 05/27/2019 2331 1.778 m (5\' 10" )     Head Circumference --      Peak Flow --      Pain Score 05/11/19 2330 3     Pain Loc --      Pain Edu? --      Excl. in [place]? --     Constitutional: Alert and oriented. Well appearing and in no acute distress. Eyes: Conjunctivae are normal. PERRL. EOMI. Mouth/Throat: Mucous membranes are moist.  Oropharynx non-erythematous. Neck: No stridor.   Cardiovascular: Normal rate, regular rhythm. Good  peripheral circulation. Grossly normal heart sounds. Respiratory: Normal respiratory effort.  No retractions. No audible wheezing. Gastrointestinal: Right upper quadrant epigastric left upper quadrant tenderness to palpation.  No distention.  Musculoskeletal: No lower extremity tenderness nor edema. No gross deformities of extremities. Neurologic:  Normal speech and language. No gross focal neurologic deficits are appreciated.  Skin:  Skin is warm, dry and intact. No rash noted. Psychiatric: Mood and affect are normal. Speech and behavior are normal.  ____________________________________________   LABS (all labs ordered are listed, but only abnormal results are displayed)  Labs Reviewed  COMPREHENSIVE METABOLIC PANEL - Abnormal; Notable for the following components:      Result Value   Chloride 96 (*)    Glucose, Bld 164 (*)    BUN 29 (*)    Creatinine, Ser 2.48 (*)    Calcium 8.8 (*)    AST 92 (*)    ALT 82 (*)    Alkaline Phosphatase 335 (*)    Total Bilirubin 1.7 (*)    GFR calc non Af Amer 26 (*)    GFR calc Af Amer 30 (*)    All other components within normal limits  CBC WITH DIFFERENTIAL/PLATELET - Abnormal; Notable for the following components:   WBC 11.5 (*)     Hemoglobin 17.8 (*)    HCT 54.5 (*)    Neutro Abs 9.1 (*)    Abs Immature Granulocytes 0.22 (*)    All other components within normal limits  PROTIME-INR - Abnormal; Notable for the following components:   Prothrombin Time 16.2 (*)    INR 1.3 (*)    All other components within normal limits  BLOOD GAS, ARTERIAL - Abnormal; Notable for the following components:   pCO2 arterial 28 (*)    pO2, Arterial 409 (*)    Bicarbonate 15.5 (*)    Acid-base deficit 8.6 (*)    All other components within normal limits  SARS CORONAVIRUS 2 (HOSPITAL ORDER, [facility])  CBC  BLOOD GAS, ARTERIAL  TYPE AND SCREEN  PREPARE RBC (CROSSMATCH)  ABO/RH  TYPE AND SCREEN   ____________________________________________  EKG  ED ECG REPORT I, Story N , the attending physician, personally viewed and interpreted this ECG.   Date: 05/26/2019  EKG Time: 11:36 PM  Rate: 68  Rhythm: Atrial flutter  Axis: Normal  Intervals: Irregular PR interval  ST&T Change: None  ____________________________________________  RADIOLOGY I, Sartell N , personally viewed and evaluated these images (plain radiographs) as part of my medical decision making, as well as reviewing the written report by the radiologist.  ED MD interpretation: Cardiomegaly with developing pulmonary edema  Official radiology report(s): Portable Chest X-ray  Result Date: 2019-05-14 CLINICAL DATA:  Endotracheal tube placement. EXAM: PORTABLE CHEST 1 VIEW COMPARISON:  One-view chest x-ray May 14, 2019 FINDINGS: The heart is enlarged. The patient has been intubated. Endotracheal tube terminates 3 cm above the carina. Progressive bilateral lower lobe airspace disease is present. Increased edema and bilateral effusions are present. Atherosclerotic changes are again noted at the aortic arch. IMPRESSION: 1. Interval intubation. The endotracheal tube terminates 3 cm above the carina, in satisfactory position. 2.  Cardiomegaly with increasing interstitial edema and bilateral effusions consistent with congestive heart failure. 3. Increasing bibasilar airspace disease. While this likely reflects atelectasis, infection is not excluded. Electronically Signed   By: San Morelle M.D.   On: 05-14-19 05:23   Dg Chest Portable 1 View  Result Date: 05/14/2019 CLINICAL DATA:  Dyspnea EXAM: PORTABLE  CHEST 1 VIEW COMPARISON:  Chest x-ray dated 04/29/2019 FINDINGS: The heart size is enlarged. There are small bilateral pleural effusions, right greater than left. There is generalized volume overload with early developing pulmonary edema. A left-sided ICD noted. The patient is status post prior median sternotomy. There is no pneumothorax. IMPRESSION: 1. Cardiomegaly with developing pulmonary edema. 2. Small right-sided pleural effusion, increased from prior study. Electronically Signed   By: Constance Holster M.D.   On: 05-31-19 00:31    ____________________________________________   PROCEDURES   Procedure(s) performed (including Critical Care):  .Critical Care Performed by: Gregor Hams, MD Authorized by: Gregor Hams, MD   Critical care provider statement:    Critical care time (minutes):  60   Critical care time was exclusive of:  Separately billable procedures and treating other patients (Hemorrhagic shock)   Critical care was necessary to treat or prevent imminent or life-threatening deterioration of the following conditions:  Circulatory failure   Critical care was time spent personally by me on the following activities:  Development of treatment plan with patient or surrogate, discussions with consultants, evaluation of patient's response to treatment, examination of patient, obtaining history from patient or surrogate, ordering and performing treatments and interventions, ordering and review of laboratory studies, ordering and review of radiographic studies, pulse oximetry, re-evaluation  of patient's condition and review of old charts   I assumed direction of critical care for this patient from another provider in my specialty: no       ____________________________________________   INITIAL IMPRESSION / MDM / [place] / ED COURSE  As part of my medical decision making, I reviewed the following data within the electronic MEDICAL RECORD NUMBER   68 year old male presenting to the emergency department secondary to upper GI bleed hypotensive.  Patient given 2 units uncrossed match blood secondary to hemorrhagic shock.  Patient also given Andexxa secondary to life-threatening GI bleed in the setting of Eliquis. patient's blood pressure initially improved with systolic of 161 however current blood pressure is 5/55 and such Levophed will be initiated as the patient has an ejection fraction of  25%.  Spoke with Dr. Alice Reichert gastroenterology immediately after evaluating the patient who recommended patient be admitted to the hospital since stepdown unit.  Patient subsequently discussed with Dr. Jannifer Franklin for hospital admission further evaluation and management      *Chris Nguyen was evaluated in Emergency Department on 05/31/19 for the symptoms described in the history of present illness. He was evaluated in the context of the global COVID-19 pandemic, which necessitated consideration that the patient might be at risk for infection with the SARS-CoV-2 virus that causes COVID-19. Institutional protocols and algorithms that pertain to the evaluation of patients at risk for COVID-19 are in a state of rapid change based on information released by regulatory bodies including the CDC and federal and state organizations. These policies and algorithms were followed during the patient's care in the ED.  Some ED evaluations and interventions may be delayed as a result of limited staffing during the pandemic.*    ____________________________________________  FINAL CLINICAL IMPRESSION(S) / ED  DIAGNOSES  Final diagnoses:  Hematemesis with nausea  Hemorrhagic shock (HCC)     MEDICATIONS GIVEN DURING THIS VISIT:  Medications  DOPamine (INTROPIN) 800 mg in dextrose 5 % 250 mL (3.2 mg/mL) infusion ( Intravenous MAR Unhold May 31, 2019 0404)  ipratropium-albuterol (DUONEB) 0.5-2.5 (3) MG/3ML nebulizer solution 3 mL (has no administration in time range)  dexmedetomidine (PRECEDEX) 400 MCG/100ML (4  mcg/mL) infusion (has no administration in time range)  norepinephrine (LEVOPHED) 4-5 MG/250ML-% infusion SOLN (has no administration in time range)  vasopressin (PITRESSIN) 40 Units in sodium chloride 0.9 % 250 mL (0.16 Units/mL) infusion (has no administration in time range)  hydrocortisone sodium succinate (SOLU-CORTEF) 100 MG injection 50 mg (has no administration in time range)  norepinephrine (LEVOPHED) 16 mg in dextrose 5 % 250 mL (0.064 mg/mL) infusion (has no administration in time range)  octreotide (SANDOSTATIN) injection 100 mcg (100 mcg Intravenous Given 2019/06/11 0049)  pantoprazole (PROTONIX) 80 mg in sodium chloride 0.9 % 100 mL IVPB (0 mg Intravenous Stopped 2019-06-11 0108)  ondansetron (ZOFRAN) injection 4 mg (4 mg Intravenous Given 06-11-19 0015)  sodium chloride 0.9 % bolus 1,000 mL (0 mLs Intravenous Stopped 06/11/2019 0002)  coag fact Xa recombinant (ANDEXXA) low dose infusion 900 mg (0 mg Intravenous Stopped 2019/06/11 0500)  DOPamine (INTROPIN) 3.2-5 MG/ML-% infusion (  Duplicate 2/80/03 4917)  sodium bicarbonate injection 100 mEq (100 mEq Intravenous Given 2019-06-11 0500)  furosemide (LASIX) injection 40 mg (40 mg Intravenous Given June 11, 2019 0445)  furosemide (LASIX) injection 40 mg (40 mg Intravenous Given 06-11-19 0500)  propofol (DIPRIVAN) 10 mg/mL bolus/IV push (has no administration in time range)  midazolam (VERSED) 2 MG/2ML injection (has no administration in time range)  succinylcholine (ANECTINE) 20 MG/ML injection (has no administration in time range)  rocuronium (ZEMURON)  50 MG/5ML injection (has no administration in time range)  phenylephrine (NEO-SYNEPHRINE) 10 MG/ML injection (has no administration in time range)  vasopressin (PITRESSIN) 20 UNIT/ML injection (has no administration in time range)     ED Discharge Orders    None       Note:  This document was prepared using Dragon voice recognition software and may include unintentional dictation errors.   Gregor Hams, MD 06/11/19 [phone]

## 2019-05-11 NOTE — ED Notes (Signed)
Pressure bag applied to NS bolus. Per verbal from [place] stop NS bolus once blood started.

## 2019-05-11 NOTE — ED Notes (Signed)
Raquel RN attempting for 2nd IV.

## 2019-05-11 NOTE — ED Notes (Signed)
Pt placed on 2L [place] d/t desat to 90% RA.

## 2019-05-11 NOTE — ED Notes (Signed)
Started 1L NS bolus and continued EMS 500cc bolus d/t continued low BP readings. Tried in both arms.

## 2019-05-11 NOTE — ED Triage Notes (Signed)
Pt in via EMS from home d/t sudden dyspnea, weakness, and vomiting bright red blood per EMS. Pt currently A&Ox4. Pt states not normally on home O2. Pt on 4L O2 [place] with EMS. Hypotensive initially with EMS 87/52; last BP with EMS 104/66; 96% on 4L; Afib on EMS EKG; pt states Afib baseline; 98.0 oral with EMS ; 18g L ac IV by EMS.

## 2019-05-12 ENCOUNTER — Inpatient Hospital Stay: Payer: Medicare HMO | Admitting: Anesthesiology

## 2019-05-12 ENCOUNTER — Inpatient Hospital Stay: Payer: Medicare HMO

## 2019-05-12 ENCOUNTER — Inpatient Hospital Stay
Admit: 2019-05-12 | Discharge: 2019-05-12 | Disposition: A | Discharge status: Home / Self Care | Payer: Medicare HMO | Attending: Pulmonary Disease | Admitting: Pulmonary Disease

## 2019-05-12 ENCOUNTER — Encounter: Admission: EM | Disposition: E | Payer: Self-pay | Source: Home / Self Care | Attending: Internal Medicine

## 2019-05-12 ENCOUNTER — Emergency Department: Payer: Medicare HMO

## 2019-05-12 ENCOUNTER — Encounter: Payer: Self-pay | Admitting: *Deleted

## 2019-05-12 ENCOUNTER — Ambulatory Visit: Payer: Medicare HMO | Admitting: Family

## 2019-05-12 DIAGNOSIS — E039 Hypothyroidism, unspecified: Secondary | ICD-10-CM | POA: Diagnosis present

## 2019-05-12 DIAGNOSIS — J9600 Acute respiratory failure, unspecified whether with hypoxia or hypercapnia: Secondary | ICD-10-CM

## 2019-05-12 DIAGNOSIS — K264 Chronic or unspecified duodenal ulcer with hemorrhage: Secondary | ICD-10-CM | POA: Diagnosis present

## 2019-05-12 DIAGNOSIS — K766 Portal hypertension: Secondary | ICD-10-CM | POA: Diagnosis present

## 2019-05-12 DIAGNOSIS — E785 Hyperlipidemia, unspecified: Secondary | ICD-10-CM | POA: Diagnosis present

## 2019-05-12 DIAGNOSIS — N189 Chronic kidney disease, unspecified: Secondary | ICD-10-CM | POA: Diagnosis not present

## 2019-05-12 DIAGNOSIS — K2981 Duodenitis with bleeding: Secondary | ICD-10-CM | POA: Diagnosis not present

## 2019-05-12 DIAGNOSIS — I5043 Acute on chronic combined systolic (congestive) and diastolic (congestive) heart failure: Secondary | ICD-10-CM | POA: Diagnosis present

## 2019-05-12 DIAGNOSIS — N179 Acute kidney failure, unspecified: Secondary | ICD-10-CM | POA: Diagnosis present

## 2019-05-12 DIAGNOSIS — N400 Enlarged prostate without lower urinary tract symptoms: Secondary | ICD-10-CM | POA: Diagnosis present

## 2019-05-12 DIAGNOSIS — I509 Heart failure, unspecified: Secondary | ICD-10-CM | POA: Diagnosis not present

## 2019-05-12 DIAGNOSIS — K922 Gastrointestinal hemorrhage, unspecified: Secondary | ICD-10-CM | POA: Diagnosis not present

## 2019-05-12 DIAGNOSIS — J811 Chronic pulmonary edema: Secondary | ICD-10-CM | POA: Diagnosis not present

## 2019-05-12 DIAGNOSIS — Z03818 Encounter for observation for suspected exposure to other biological agents ruled out: Secondary | ICD-10-CM | POA: Diagnosis not present

## 2019-05-12 DIAGNOSIS — I517 Cardiomegaly: Secondary | ICD-10-CM | POA: Diagnosis not present

## 2019-05-12 DIAGNOSIS — I959 Hypotension, unspecified: Secondary | ICD-10-CM | POA: Diagnosis not present

## 2019-05-12 DIAGNOSIS — N172 Acute kidney failure with medullary necrosis: Secondary | ICD-10-CM | POA: Diagnosis not present

## 2019-05-12 DIAGNOSIS — I10 Essential (primary) hypertension: Secondary | ICD-10-CM | POA: Diagnosis not present

## 2019-05-12 DIAGNOSIS — J810 Acute pulmonary edema: Secondary | ICD-10-CM | POA: Diagnosis not present

## 2019-05-12 DIAGNOSIS — D62 Acute posthemorrhagic anemia: Secondary | ICD-10-CM | POA: Diagnosis present

## 2019-05-12 DIAGNOSIS — I8501 Esophageal varices with bleeding: Secondary | ICD-10-CM | POA: Diagnosis not present

## 2019-05-12 DIAGNOSIS — I119 Hypertensive heart disease without heart failure: Secondary | ICD-10-CM | POA: Diagnosis present

## 2019-05-12 DIAGNOSIS — I8500 Esophageal varices without bleeding: Secondary | ICD-10-CM | POA: Diagnosis present

## 2019-05-12 DIAGNOSIS — Z95810 Presence of automatic (implantable) cardiac defibrillator: Secondary | ICD-10-CM | POA: Diagnosis not present

## 2019-05-12 DIAGNOSIS — R6521 Severe sepsis with septic shock: Secondary | ICD-10-CM | POA: Diagnosis present

## 2019-05-12 DIAGNOSIS — N183 Chronic kidney disease, stage 3 (moderate): Secondary | ICD-10-CM | POA: Diagnosis present

## 2019-05-12 DIAGNOSIS — K920 Hematemesis: Secondary | ICD-10-CM

## 2019-05-12 DIAGNOSIS — K449 Diaphragmatic hernia without obstruction or gangrene: Secondary | ICD-10-CM | POA: Diagnosis not present

## 2019-05-12 DIAGNOSIS — J9602 Acute respiratory failure with hypercapnia: Secondary | ICD-10-CM | POA: Diagnosis present

## 2019-05-12 DIAGNOSIS — J9601 Acute respiratory failure with hypoxia: Secondary | ICD-10-CM | POA: Diagnosis present

## 2019-05-12 DIAGNOSIS — K219 Gastro-esophageal reflux disease without esophagitis: Secondary | ICD-10-CM | POA: Diagnosis not present

## 2019-05-12 DIAGNOSIS — Z20828 Contact with and (suspected) exposure to other viral communicable diseases: Secondary | ICD-10-CM | POA: Diagnosis present

## 2019-05-12 DIAGNOSIS — Z66 Do not resuscitate: Secondary | ICD-10-CM | POA: Diagnosis not present

## 2019-05-12 DIAGNOSIS — I43 Cardiomyopathy in diseases classified elsewhere: Secondary | ICD-10-CM | POA: Diagnosis present

## 2019-05-12 DIAGNOSIS — I130 Hypertensive heart and chronic kidney disease with heart failure and stage 1 through stage 4 chronic kidney disease, or unspecified chronic kidney disease: Secondary | ICD-10-CM | POA: Diagnosis not present

## 2019-05-12 DIAGNOSIS — I2510 Atherosclerotic heart disease of native coronary artery without angina pectoris: Secondary | ICD-10-CM | POA: Diagnosis present

## 2019-05-12 DIAGNOSIS — Z4689 Encounter for fitting and adjustment of other specified devices: Secondary | ICD-10-CM | POA: Diagnosis not present

## 2019-05-12 DIAGNOSIS — A419 Sepsis, unspecified organism: Secondary | ICD-10-CM | POA: Diagnosis present

## 2019-05-12 DIAGNOSIS — I480 Paroxysmal atrial fibrillation: Secondary | ICD-10-CM | POA: Diagnosis present

## 2019-05-12 DIAGNOSIS — J90 Pleural effusion, not elsewhere classified: Secondary | ICD-10-CM | POA: Diagnosis not present

## 2019-05-12 DIAGNOSIS — Z8674 Personal history of sudden cardiac arrest: Secondary | ICD-10-CM | POA: Diagnosis not present

## 2019-05-12 DIAGNOSIS — M19032 Primary osteoarthritis, left wrist: Secondary | ICD-10-CM | POA: Diagnosis present

## 2019-05-12 DIAGNOSIS — Z8249 Family history of ischemic heart disease and other diseases of the circulatory system: Secondary | ICD-10-CM | POA: Diagnosis not present

## 2019-05-12 HISTORY — PX: ESOPHAGOGASTRODUODENOSCOPY: SHX5428

## 2019-05-12 LAB — COMPREHENSIVE METABOLIC PANEL
ALT: 136 U/L — ABNORMAL HIGH (ref 0–44)
ALT: 82 U/L — ABNORMAL HIGH (ref 0–44)
ALT: 865 U/L — ABNORMAL HIGH (ref 0–44)
AST: 1034 U/L — ABNORMAL HIGH (ref 15–41)
AST: 220 U/L — ABNORMAL HIGH (ref 15–41)
AST: 92 U/L — ABNORMAL HIGH (ref 15–41)
Albumin: 2.4 g/dL — ABNORMAL LOW (ref 3.5–5.0)
Albumin: 2.9 g/dL — ABNORMAL LOW (ref 3.5–5.0)
Albumin: 3.6 g/dL (ref 3.5–5.0)
Alkaline Phosphatase: 248 U/L — ABNORMAL HIGH (ref 38–126)
Alkaline Phosphatase: 311 U/L — ABNORMAL HIGH (ref 38–126)
Alkaline Phosphatase: 335 U/L — ABNORMAL HIGH (ref 38–126)
Anion gap: 13 (ref 5–15)
Anion gap: 16 — ABNORMAL HIGH (ref 5–15)
Anion gap: 18 — ABNORMAL HIGH (ref 5–15)
BUN: 29 mg/dL — ABNORMAL HIGH (ref 8–23)
BUN: 29 mg/dL — ABNORMAL HIGH (ref 8–23)
BUN: 29 mg/dL — ABNORMAL HIGH (ref 8–23)
CO2: 21 mmol/L — ABNORMAL LOW (ref 22–32)
CO2: 23 mmol/L (ref 22–32)
CO2: 31 mmol/L (ref 22–32)
Calcium: 6.6 mg/dL — ABNORMAL LOW (ref 8.9–10.3)
Calcium: 7.4 mg/dL — ABNORMAL LOW (ref 8.9–10.3)
Calcium: 8.8 mg/dL — ABNORMAL LOW (ref 8.9–10.3)
Chloride: 100 mmol/L (ref 98–111)
Chloride: 96 mmol/L — ABNORMAL LOW (ref 98–111)
Chloride: 98 mmol/L (ref 98–111)
Creatinine, Ser: 2.48 mg/dL — ABNORMAL HIGH (ref 0.61–1.24)
Creatinine, Ser: 2.97 mg/dL — ABNORMAL HIGH (ref 0.61–1.24)
Creatinine, Ser: 3.22 mg/dL — ABNORMAL HIGH (ref 0.61–1.24)
GFR calc Af Amer: 22 mL/min — ABNORMAL LOW (ref >60)
GFR calc Af Amer: 24 mL/min — ABNORMAL LOW (ref >60)
GFR calc Af Amer: 30 mL/min — ABNORMAL LOW (ref >60)
GFR calc non Af Amer: 19 mL/min — ABNORMAL LOW (ref >60)
GFR calc non Af Amer: 21 mL/min — ABNORMAL LOW (ref >60)
GFR calc non Af Amer: 26 mL/min — ABNORMAL LOW (ref >60)
Glucose, Bld: 122 mg/dL — ABNORMAL HIGH (ref 70–99)
Glucose, Bld: 164 mg/dL — ABNORMAL HIGH (ref 70–99)
Glucose, Bld: 230 mg/dL — ABNORMAL HIGH (ref 70–99)
Potassium: 3.5 mmol/L (ref 3.5–5.1)
Potassium: 4.9 mmol/L (ref 3.5–5.1)
Potassium: 5.2 mmol/L — ABNORMAL HIGH (ref 3.5–5.1)
Sodium: 137 mmol/L (ref 135–145)
Sodium: 139 mmol/L (ref 135–145)
Sodium: 140 mmol/L (ref 135–145)
Total Bilirubin: 1.7 mg/dL — ABNORMAL HIGH (ref 0.3–1.2)
Total Bilirubin: 4.3 mg/dL — ABNORMAL HIGH (ref 0.3–1.2)
Total Bilirubin: 4.5 mg/dL — ABNORMAL HIGH (ref 0.3–1.2)
Total Protein: 5 g/dL — ABNORMAL LOW (ref 6.5–8.1)
Total Protein: 6.1 g/dL — ABNORMAL LOW (ref 6.5–8.1)
Total Protein: 7.2 g/dL (ref 6.5–8.1)

## 2019-05-12 LAB — BLOOD GAS, ARTERIAL
Acid-base deficit: 8.6 mmol/L — ABNORMAL HIGH (ref 0.0–2.0)
Acid-base deficit: 4.4 mmol/L — ABNORMAL HIGH (ref 0.0–2.0)
Acid-base deficit: 11.9 mmol/L — ABNORMAL HIGH (ref 0.0–2.0)
Acid-base deficit: 14 mmol/L — ABNORMAL HIGH (ref 0.0–2.0)
Bicarbonate: 15.5 mmol/L — ABNORMAL LOW (ref 20.0–28.0)
Bicarbonate: 17.5 mmol/L — ABNORMAL LOW (ref 20.0–28.0)
Bicarbonate: 9.3 mmol/L — ABNORMAL LOW (ref 20.0–28.0)
Bicarbonate: 10.8 mmol/L — ABNORMAL LOW (ref 20.0–28.0)
FIO2: 1
FIO2: 1
FIO2: 0.9
FIO2: 0.6
MECHVT: 500 mL
Mechanical Rate: 15
O2 Saturation: 100 %
O2 Saturation: 100 %
O2 Saturation: 99.9 %
O2 Saturation: 99.3 %
PEEP: 10 cmH2O
PEEP: 10 cmH2O
PEEP: 10 cmH2O
PIP: 30 cmH2O
Patient temperature: 37
Patient temperature: 37
Patient temperature: 37
Patient temperature: 37
RATE: 15 resp/min
RATE: 15 resp/min
pCO2 arterial: 28 mmHg — ABNORMAL LOW (ref 32.0–48.0)
pCO2 arterial: 24 mmHg — ABNORMAL LOW (ref 32.0–48.0)
pCO2 arterial: <19 mmHg — CL (ref 32.0–48.0)
pCO2 arterial: 23 mmHg — ABNORMAL LOW (ref 32.0–48.0)
pH, Arterial: 7.35 (ref 7.350–7.450)
pH, Arterial: 7.47 — ABNORMAL HIGH (ref 7.350–7.450)
pH, Arterial: 7.43 (ref 7.350–7.450)
pH, Arterial: 7.28 — ABNORMAL LOW (ref 7.350–7.450)
pO2, Arterial: 409 mmHg — ABNORMAL HIGH (ref 83.0–108.0)
pO2, Arterial: 424 mmHg — ABNORMAL HIGH (ref 83.0–108.0)
pO2, Arterial: 328 mmHg — ABNORMAL HIGH (ref 83.0–108.0)
pO2, Arterial: 167 mmHg — ABNORMAL HIGH (ref 83.0–108.0)

## 2019-05-12 LAB — CBC
HCT: 58.9 % — ABNORMAL HIGH (ref 39.0–52.0)
HCT: 61.6 % — ABNORMAL HIGH (ref 39.0–52.0)
HCT: 56.8 % — ABNORMAL HIGH (ref 39.0–52.0)
Hemoglobin: 19 g/dL — ABNORMAL HIGH (ref 13.0–17.0)
Hemoglobin: 20.1 g/dL — ABNORMAL HIGH (ref 13.0–17.0)
Hemoglobin: 18.1 g/dL — ABNORMAL HIGH (ref 13.0–17.0)
MCH: 30.3 pg (ref 26.0–34.0)
MCH: 30.6 pg (ref 26.0–34.0)
MCH: 30.8 pg (ref 26.0–34.0)
MCHC: 32.3 g/dL (ref 30.0–36.0)
MCHC: 32.6 g/dL (ref 30.0–36.0)
MCHC: 31.9 g/dL (ref 30.0–36.0)
MCV: 93.8 fL (ref 80.0–100.0)
MCV: 93.8 fL (ref 80.0–100.0)
MCV: 96.8 fL (ref 80.0–100.0)
Platelets: 70 10*3/uL — ABNORMAL LOW (ref 150–400)
Platelets: 39 10*3/uL — ABNORMAL LOW (ref 150–400)
Platelets: 22 10*3/uL — CL (ref 150–400)
RBC: 6.28 MIL/uL — ABNORMAL HIGH (ref 4.22–5.81)
RBC: 6.57 MIL/uL — ABNORMAL HIGH (ref 4.22–5.81)
RBC: 5.87 MIL/uL — ABNORMAL HIGH (ref 4.22–5.81)
RDW: 16.6 % — ABNORMAL HIGH (ref 11.5–15.5)
RDW: 17.2 % — ABNORMAL HIGH (ref 11.5–15.5)
RDW: 16.9 % — ABNORMAL HIGH (ref 11.5–15.5)
WBC: 20.5 10*3/uL — ABNORMAL HIGH (ref 4.0–10.5)
WBC: 23.9 10*3/uL — ABNORMAL HIGH (ref 4.0–10.5)
WBC: 26.7 10*3/uL — ABNORMAL HIGH (ref 4.0–10.5)
nRBC: 0.2 % (ref 0.0–0.2)
nRBC: 0.1 % (ref 0.0–0.2)

## 2019-05-12 LAB — PROTIME-INR
INR: 1.3 — ABNORMAL HIGH (ref 0.8–1.2)
INR: 2.6 — ABNORMAL HIGH (ref 0.8–1.2)
Prothrombin Time: 16.2 seconds — ABNORMAL HIGH (ref 11.4–15.2)
Prothrombin Time: 27.5 seconds — ABNORMAL HIGH (ref 11.4–15.2)

## 2019-05-12 LAB — TROPONIN I: Troponin I: 6.44 ng/mL (ref <0.03)

## 2019-05-12 LAB — PROCALCITONIN: Procalcitonin: 1.54 ng/mL

## 2019-05-12 LAB — GLUCOSE, CAPILLARY
Glucose-Capillary: 116 mg/dL — ABNORMAL HIGH (ref 70–99)
Glucose-Capillary: 256 mg/dL — ABNORMAL HIGH (ref 70–99)
Glucose-Capillary: <10 mg/dL — CL (ref 70–99)

## 2019-05-12 LAB — SARS CORONAVIRUS 2 BY RT PCR (HOSPITAL ORDER, PERFORMED IN ~~LOC~~ HOSPITAL LAB): SARS Coronavirus 2: NEGATIVE

## 2019-05-12 LAB — ECHOCARDIOGRAM COMPLETE
Height: 70 in
Weight: 2928 oz

## 2019-05-12 LAB — APTT: aPTT: 84 seconds — ABNORMAL HIGH (ref 24–36)

## 2019-05-12 LAB — ABO/RH: ABO/RH(D): O POS

## 2019-05-12 LAB — MAGNESIUM: Magnesium: 2.1 mg/dL (ref 1.7–2.4)

## 2019-05-12 LAB — LACTIC ACID, PLASMA: Lactic Acid, Venous: 7.6 mmol/L (ref 0.5–1.9)

## 2019-05-12 SURGERY — EGD (ESOPHAGOGASTRODUODENOSCOPY)
Anesthesia: General

## 2019-05-12 MED ORDER — OCTREOTIDE ACETATE 100 MCG/ML IJ SOLN
100.0000 ug | Freq: Once | INTRAMUSCULAR | Status: AC
  Administered 2019-05-12: 100 ug via INTRAVENOUS
  Filled 2019-05-12: qty 1

## 2019-05-12 MED ORDER — SUCCINYLCHOLINE CHLORIDE 20 MG/ML IJ SOLN
INTRAMUSCULAR | Status: DC | PRN
  Administered 2019-05-12: 140 mg via INTRAVENOUS

## 2019-05-12 MED ORDER — ROCURONIUM BROMIDE 100 MG/10ML IV SOLN
INTRAVENOUS | Status: DC | PRN
  Administered 2019-05-12: 50 mg via INTRAVENOUS

## 2019-05-12 MED ORDER — SODIUM CHLORIDE 0.9 % IV SOLN
INTRAVENOUS | Status: DC | PRN
  Administered 2019-05-12: 03:00:00 via INTRAVENOUS

## 2019-05-12 MED ORDER — PROPOFOL 10 MG/ML IV BOLUS
INTRAVENOUS | Status: AC
  Filled 2019-05-12: qty 20

## 2019-05-12 MED ORDER — SODIUM CHLORIDE 0.9 % IV BOLUS
1000.0000 mL | Freq: Once | INTRAVENOUS | Status: AC
  Administered 2019-05-11: 1000 mL via INTRAVENOUS

## 2019-05-12 MED ORDER — PROPOFOL 10 MG/ML IV BOLUS
INTRAVENOUS | Status: DC | PRN
  Administered 2019-05-12: 150 mg via INTRAVENOUS

## 2019-05-12 MED ORDER — HYDROCORTISONE NA SUCCINATE PF 100 MG IJ SOLR
50.0000 mg | Freq: Four times a day (QID) | INTRAMUSCULAR | Status: DC
  Administered 2019-05-12 (×2): 50 mg via INTRAVENOUS
  Filled 2019-05-12 (×2): qty 2

## 2019-05-12 MED ORDER — SODIUM CHLORIDE 0.9 % IV SOLN
80.0000 mg | Freq: Once | INTRAVENOUS | Status: AC
  Administered 2019-05-12: 80 mg via INTRAVENOUS
  Filled 2019-05-12: qty 80

## 2019-05-12 MED ORDER — METRONIDAZOLE IN NACL 5-0.79 MG/ML-% IV SOLN
500.0000 mg | Freq: Three times a day (TID) | INTRAVENOUS | Status: DC
  Filled 2019-05-12 (×3): qty 100

## 2019-05-12 MED ORDER — ONDANSETRON HCL 4 MG/2ML IJ SOLN
4.0000 mg | Freq: Once | INTRAMUSCULAR | Status: AC
  Administered 2019-05-12: 4 mg via INTRAVENOUS

## 2019-05-12 MED ORDER — VASOPRESSIN 20 UNIT/ML IV SOLN
0.0400 [IU]/min | INTRAVENOUS | Status: DC
  Administered 2019-05-12: 0.03 [IU]/min via INTRAVENOUS
  Filled 2019-05-12: qty 2

## 2019-05-12 MED ORDER — EMPTY CONTAINERS FLEXIBLE MISC
900.0000 mg | Freq: Once | Status: AC
  Administered 2019-05-12: 900 mg via INTRAVENOUS
  Filled 2019-05-12: qty 90

## 2019-05-12 MED ORDER — PIPERACILLIN-TAZOBACTAM 3.375 G IVPB
3.3750 g | Freq: Three times a day (TID) | INTRAVENOUS | Status: DC
  Administered 2019-05-12: 3.375 g via INTRAVENOUS

## 2019-05-12 MED ORDER — IPRATROPIUM-ALBUTEROL 0.5-2.5 (3) MG/3ML IN SOLN: 3.0000 mL | RESPIRATORY_TRACT | Status: DC | PRN

## 2019-05-12 MED ORDER — DEXTROSE 50 % IV SOLN
1.0000 | Freq: Once | INTRAVENOUS | Status: AC
  Administered 2019-05-12: 50 mL via INTRAVENOUS

## 2019-05-12 MED ORDER — SODIUM CHLORIDE 0.9 % IV BOLUS
1000.0000 mL | Freq: Once | INTRAVENOUS | Status: AC
  Administered 2019-05-12: 1000 mL via INTRAVENOUS

## 2019-05-12 MED ORDER — STERILE WATER FOR INJECTION IV SOLN
INTRAVENOUS | Status: DC
  Administered 2019-05-12: 13:00:00 via INTRAVENOUS
  Filled 2019-05-12 (×3): qty 850

## 2019-05-12 MED ORDER — DOPAMINE-DEXTROSE 3.2-5 MG/ML-% IV SOLN
0.0000 ug/kg/min | INTRAVENOUS | Status: DC
  Administered 2019-05-12: 8 ug/kg/min via INTRAVENOUS
  Administered 2019-05-12: 10 ug/kg/min via INTRAVENOUS
  Filled 2019-05-12: qty 250

## 2019-05-12 MED ORDER — DOPAMINE-DEXTROSE 3.2-5 MG/ML-% IV SOLN
INTRAVENOUS | Status: AC
  Filled 2019-05-12: qty 250

## 2019-05-12 MED ORDER — EPINEPHRINE PF 1 MG/ML IJ SOLN
0.5000 ug/min | INTRAVENOUS | Status: DC
  Administered 2019-05-12: 3 ug/min via INTRAVENOUS
  Administered 2019-05-12: 12 ug/min via INTRAVENOUS
  Filled 2019-05-12: qty 4

## 2019-05-12 MED ORDER — SODIUM CHLORIDE 0.9 % IV SOLN: INTRAVENOUS | Status: DC

## 2019-05-12 MED ORDER — FENTANYL 2500MCG IN NS 250ML (10MCG/ML) PREMIX INFUSION
0.0000 ug/h | INTRAVENOUS | Status: DC
  Administered 2019-05-12: 50 ug/h via INTRAVENOUS
  Filled 2019-05-12: qty 250

## 2019-05-12 MED ORDER — SODIUM BICARBONATE 8.4 % IV SOLN
INTRAVENOUS | Status: AC
  Administered 2019-05-12: 100 meq via INTRAVENOUS
  Filled 2019-05-12: qty 100

## 2019-05-12 MED ORDER — THIAMINE HCL 100 MG/ML IJ SOLN
Freq: Once | INTRAVENOUS | Status: DC
  Filled 2019-05-12: qty 500

## 2019-05-12 MED ORDER — SODIUM BICARBONATE 8.4 % IV SOLN
100.0000 meq | Freq: Once | INTRAVENOUS | Status: AC
  Administered 2019-05-12: 05:00:00 100 meq via INTRAVENOUS

## 2019-05-12 MED ORDER — AMIODARONE HCL IN DEXTROSE 360-4.14 MG/200ML-% IV SOLN
60.0000 mg/h | INTRAVENOUS | Status: DC
  Administered 2019-05-12: 60 mg/h via INTRAVENOUS

## 2019-05-12 MED ORDER — FUROSEMIDE 10 MG/ML IJ SOLN
INTRAMUSCULAR | Status: AC
  Administered 2019-05-12: 40 mg via INTRAVENOUS
  Filled 2019-05-12: qty 4

## 2019-05-12 MED ORDER — MIDAZOLAM HCL 2 MG/2ML IJ SOLN
INTRAMUSCULAR | Status: DC | PRN
  Administered 2019-05-12: 2 mg via INTRAVENOUS

## 2019-05-12 MED ORDER — NOREPINEPHRINE 4 MG/250ML-% IV SOLN
0.0000 ug/min | INTRAVENOUS | Status: DC
  Administered 2019-05-12: 4 mg via INTRAVENOUS
  Administered 2019-05-12: 50 ug/min via INTRAVENOUS

## 2019-05-12 MED ORDER — VASOPRESSIN 20 UNIT/ML IV SOLN
INTRAVENOUS | Status: AC
  Filled 2019-05-12: qty 1

## 2019-05-12 MED ORDER — FUROSEMIDE 10 MG/ML IJ SOLN
40.0000 mg | Freq: Once | INTRAMUSCULAR | Status: AC
  Administered 2019-05-12: 40 mg via INTRAVENOUS

## 2019-05-12 MED ORDER — ROCURONIUM BROMIDE 50 MG/5ML IV SOLN
INTRAVENOUS | Status: AC
  Filled 2019-05-12: qty 1

## 2019-05-12 MED ORDER — SODIUM CHLORIDE 0.9 % IV SOLN
1.0000 g | INTRAVENOUS | Status: DC
  Filled 2019-05-12: qty 10

## 2019-05-12 MED ORDER — VASOPRESSIN 20 UNIT/ML IV SOLN
INTRAVENOUS | Status: DC | PRN
  Administered 2019-05-12: 2 [IU] via INTRAVENOUS
  Administered 2019-05-12: 1 [IU] via INTRAVENOUS

## 2019-05-12 MED ORDER — NOREPINEPHRINE 4 MG/250ML-% IV SOLN
INTRAVENOUS | Status: AC
  Administered 2019-05-12: 50 ug/min via INTRAVENOUS
  Filled 2019-05-12: qty 250

## 2019-05-12 MED ORDER — NOREPINEPHRINE 16 MG/250ML-% IV SOLN
0.0000 ug/min | INTRAVENOUS | Status: DC
  Filled 2019-05-12: qty 250

## 2019-05-12 MED ORDER — SODIUM CHLORIDE 0.9 % IV BOLUS: 1000.0000 mL | Freq: Once | INTRAVENOUS | Status: DC

## 2019-05-12 MED ORDER — SODIUM CHLORIDE 0.9 % IV SOLN
2.0000 g | Freq: Every day | INTRAVENOUS | Status: DC
  Filled 2019-05-12: qty 20

## 2019-05-12 MED ORDER — NOREPINEPHRINE BITARTRATE 1 MG/ML IV SOLN
INTRAVENOUS | Status: DC | PRN
  Administered 2019-05-12: 03:00:00 8 ug/min via INTRAVENOUS
  Administered 2019-05-12: 03:00:00 19 ug/min via INTRAVENOUS

## 2019-05-12 MED ORDER — SODIUM BICARBONATE 8.4 % IV SOLN
150.0000 meq | Freq: Once | INTRAVENOUS | Status: AC
  Administered 2019-05-12: 150 meq via INTRAVENOUS

## 2019-05-12 MED ORDER — NOREPINEPHRINE 4 MG/250ML-% IV SOLN
INTRAVENOUS | Status: AC
  Administered 2019-05-12: 4 mg via INTRAVENOUS
  Filled 2019-05-12: qty 250

## 2019-05-12 MED ORDER — SUCCINYLCHOLINE CHLORIDE 20 MG/ML IJ SOLN
INTRAMUSCULAR | Status: AC
  Filled 2019-05-12: qty 1

## 2019-05-12 MED ORDER — SODIUM CHLORIDE (PF) 0.9 % IJ SOLN
PREFILLED_SYRINGE | INTRAMUSCULAR | Status: DC | PRN
  Administered 2019-05-12: 04:00:00 1 mL

## 2019-05-12 MED ORDER — SODIUM CHLORIDE 0.9 % IV SOLN
0.0000 ug/min | INTRAVENOUS | Status: DC
  Administered 2019-05-12: 10 ug/min via INTRAVENOUS
  Administered 2019-05-12: 120 ug/min via INTRAVENOUS
  Filled 2019-05-12: qty 40

## 2019-05-12 MED ORDER — NOREPINEPHRINE BITARTRATE 1 MG/ML IV SOLN
0.0000 ug/min | INTRAVENOUS | Status: DC
  Administered 2019-05-12: 50 ug/min via INTRAVENOUS
  Administered 2019-05-12: 20 ug/min via INTRAVENOUS
  Administered 2019-05-12: 65 ug/min via INTRAVENOUS
  Filled 2019-05-12 (×2): qty 16

## 2019-05-12 MED ORDER — MIDAZOLAM HCL 2 MG/2ML IJ SOLN
INTRAMUSCULAR | Status: AC
  Filled 2019-05-12: qty 2

## 2019-05-12 MED ORDER — AMIODARONE HCL IN DEXTROSE 360-4.14 MG/200ML-% IV SOLN
INTRAVENOUS | Status: AC
  Administered 2019-05-12: 60 mg/h via INTRAVENOUS
  Filled 2019-05-12: qty 200

## 2019-05-12 MED ORDER — SODIUM CHLORIDE 0.9 % IV SOLN
INTRAVENOUS | Status: DC | PRN
  Administered 2019-05-12: 03:00:00 50 ug/min via INTRAVENOUS

## 2019-05-12 MED ORDER — PHENYLEPHRINE HCL (PRESSORS) 10 MG/ML IV SOLN
INTRAVENOUS | Status: AC
  Filled 2019-05-12: qty 1

## 2019-05-12 MED ORDER — PANTOPRAZOLE SODIUM 40 MG IV SOLR
40.0000 mg | Freq: Two times a day (BID) | INTRAVENOUS | Status: DC
  Administered 2019-05-12: 40 mg via INTRAVENOUS
  Filled 2019-05-12: qty 40

## 2019-05-12 MED ORDER — AMIODARONE HCL IN DEXTROSE 360-4.14 MG/200ML-% IV SOLN: 30.0000 mg/h | INTRAVENOUS | Status: DC

## 2019-05-12 MED ORDER — DEXMEDETOMIDINE HCL IN NACL 400 MCG/100ML IV SOLN
0.0000 ug/kg/h | INTRAVENOUS | Status: DC
  Administered 2019-05-12: 0.4 ug/kg/h via INTRAVENOUS
  Filled 2019-05-12: qty 100

## 2019-05-12 MED FILL — Medication: Qty: 1 | Status: AC

## 2019-05-13 LAB — TYPE AND SCREEN
ABO/RH(D): O POS
Antibody Screen: NEGATIVE
Unit division: 0
Unit division: 0
Unit division: 0
Unit division: 0
Unit division: 0

## 2019-05-13 LAB — BPAM RBC
Blood Product Expiration Date: 202006272359
Blood Product Expiration Date: 202006272359
Blood Product Expiration Date: 202007142359
Blood Product Expiration Date: 202007152359
Blood Product Expiration Date: 202007152359
ISSUE DATE / TIME: 202006110058
ISSUE DATE / TIME: 202006110058
Unit Type and Rh: 5100
Unit Type and Rh: 5100
Unit Type and Rh: 5100
Unit Type and Rh: 5100
Unit Type and Rh: 5100

## 2019-05-13 LAB — PREPARE RBC (CROSSMATCH)

## 2019-05-16 ENCOUNTER — Ambulatory Visit: Payer: Self-pay | Admitting: Family Medicine

## 2019-05-16 ENCOUNTER — Telehealth: Payer: Self-pay | Admitting: Internal Medicine

## 2019-05-16 LAB — CULTURE, RESPIRATORY W GRAM STAIN: Special Requests: NORMAL

## 2019-05-16 LAB — GLUCOSE, CAPILLARY: Glucose-Capillary: <10 mg/dL — CL (ref 70–99)

## 2019-05-16 LAB — PREPARE RBC (CROSSMATCH)

## 2019-05-16 NOTE — Telephone Encounter (Signed)
Death certificate has been placed in DK's folder.  

## 2019-05-17 LAB — CULTURE, BLOOD (ROUTINE X 2)
Culture: NO GROWTH
Culture: NO GROWTH
Special Requests: ADEQUATE

## 2019-05-18 IMAGING — US US RENAL
1 series · 14 of 25 positions shown · non-contrast
Comparison: None.

CLINICAL DATA: Acute kidney failure.

EXAM:
RENAL / URINARY TRACT ULTRASOUND COMPLETE

[Series 1: us renal · 0.26mm/px · 14 of 37 slices shown]
[im 1/37]
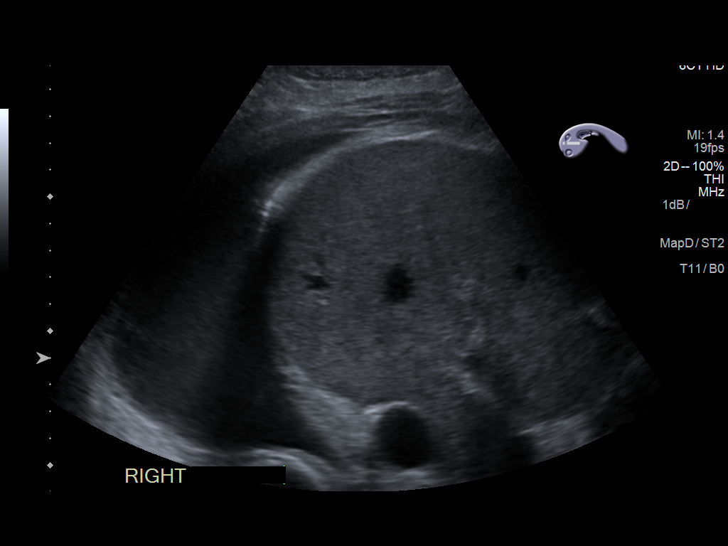
[im 4/37]
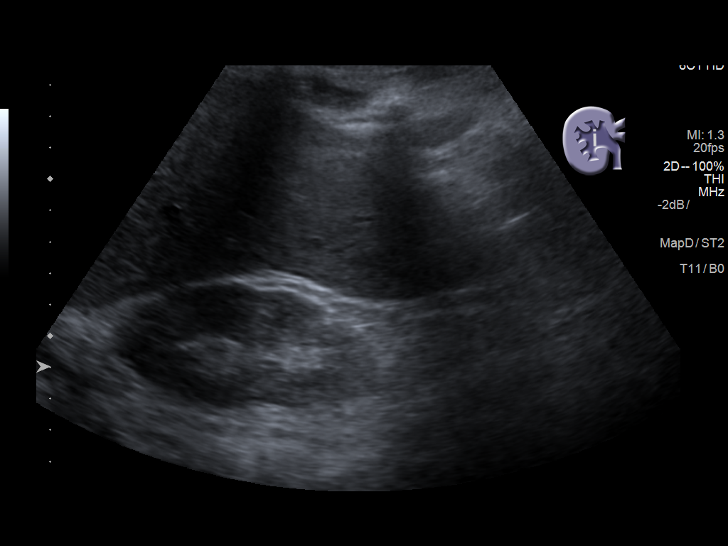
[im 7/37]
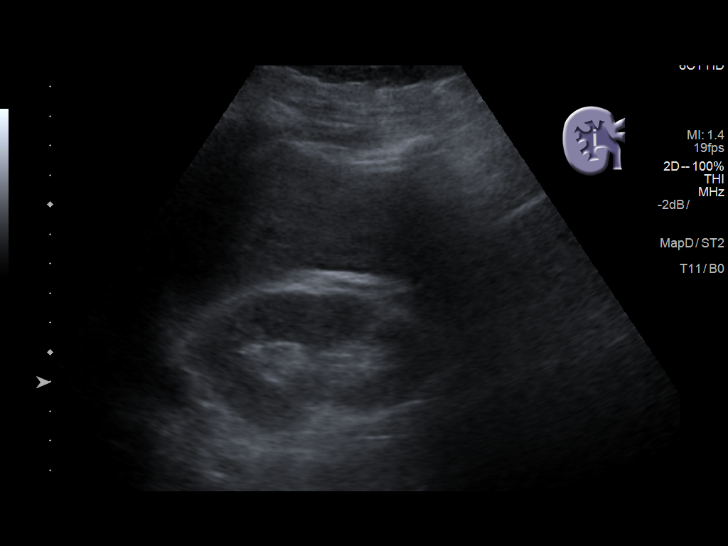
[im 10/37]
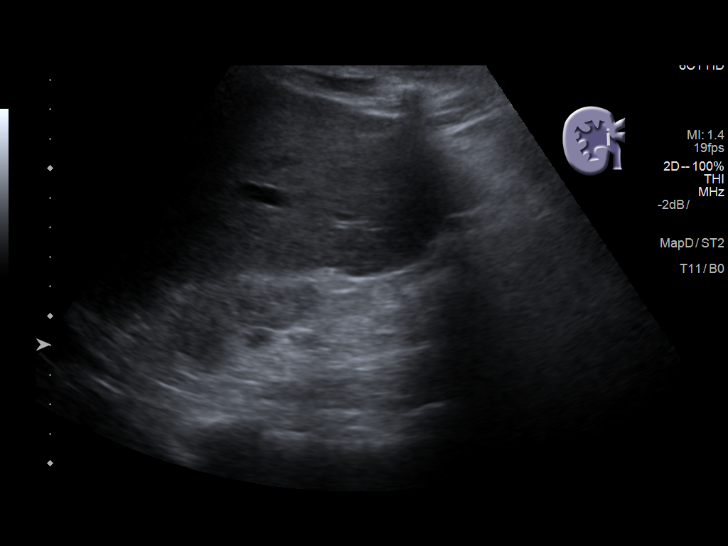
[im 13/37]
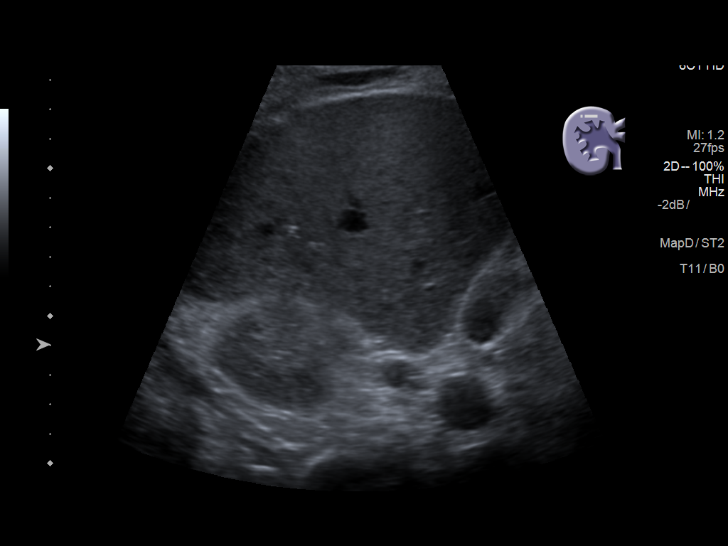
[im 14/37]
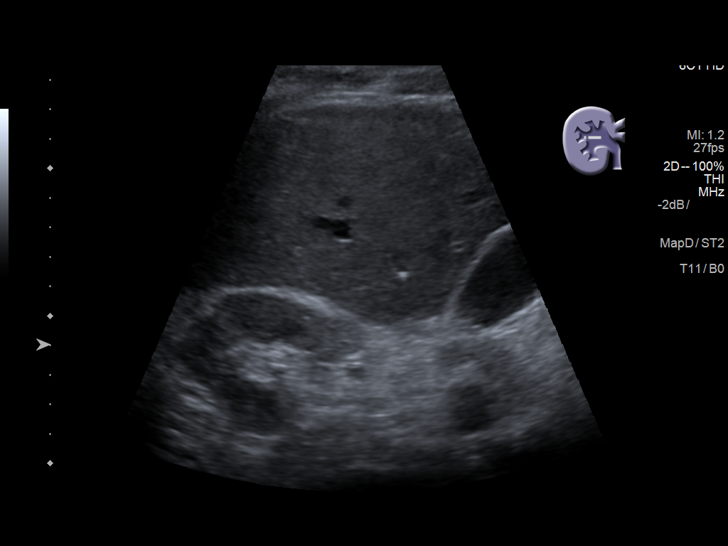
[im 17/37]
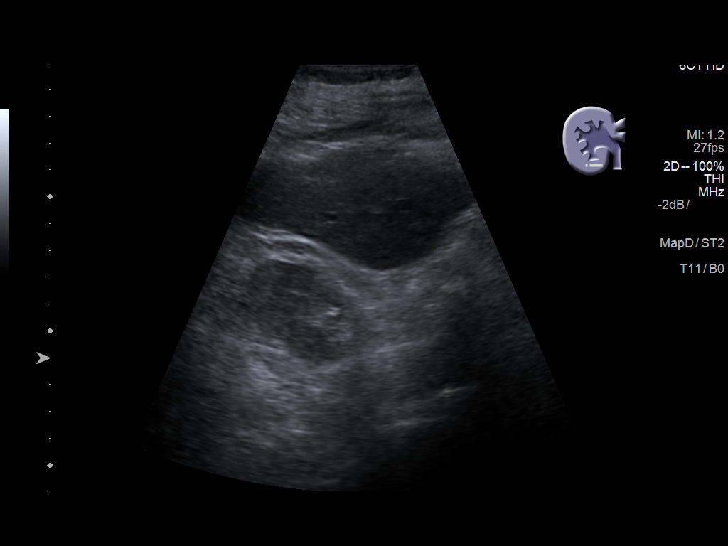
[im 20/37]
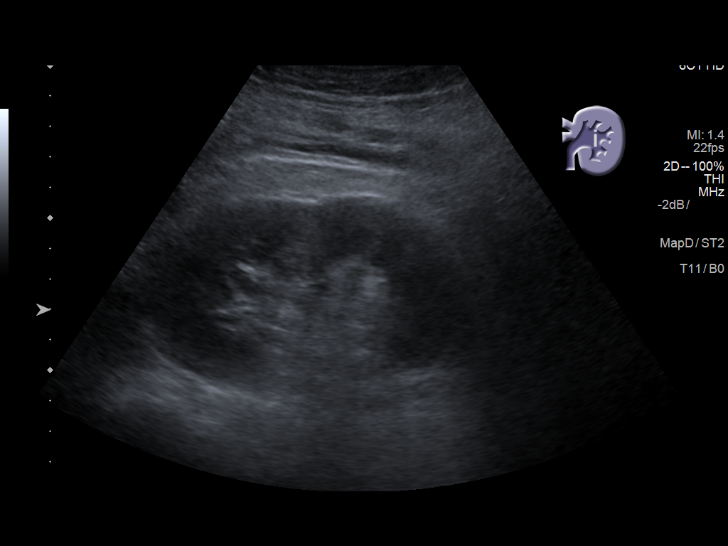
[im 23/37]
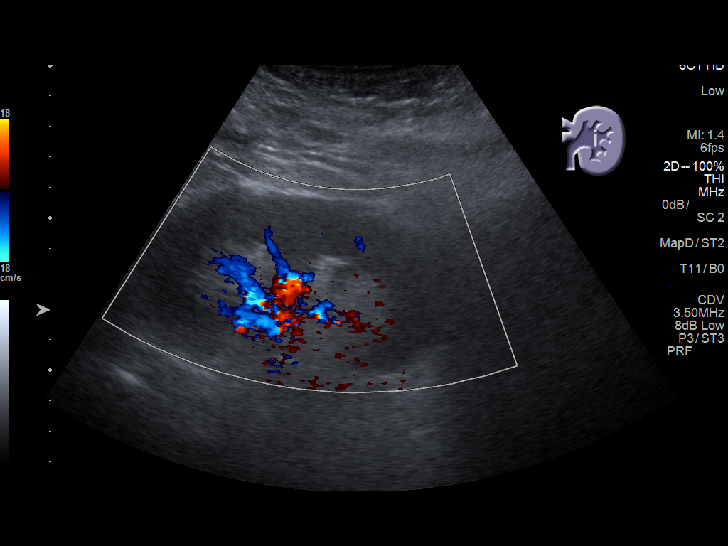
[im 25/37]
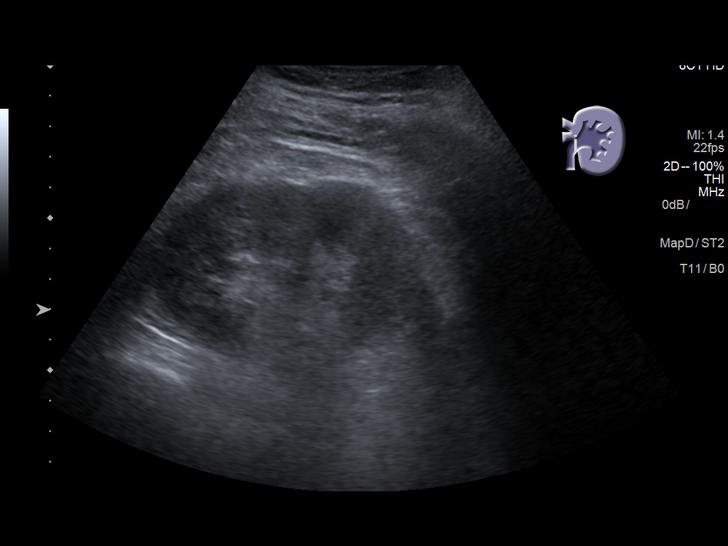
[im 28/37]
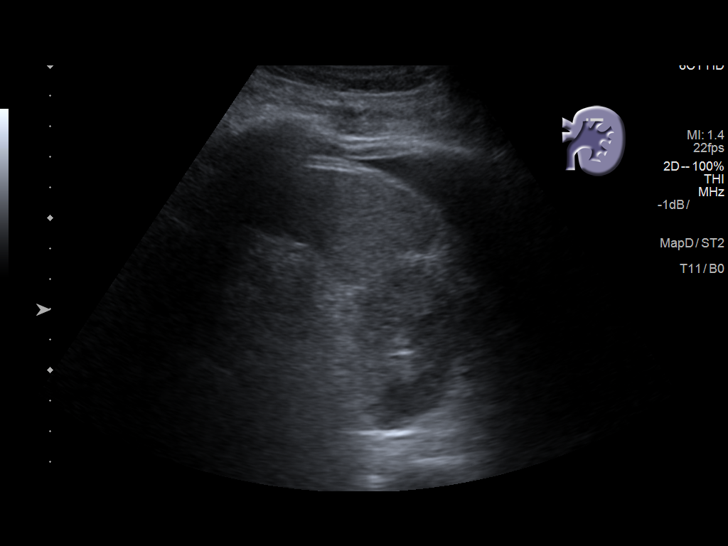
[im 31/37]
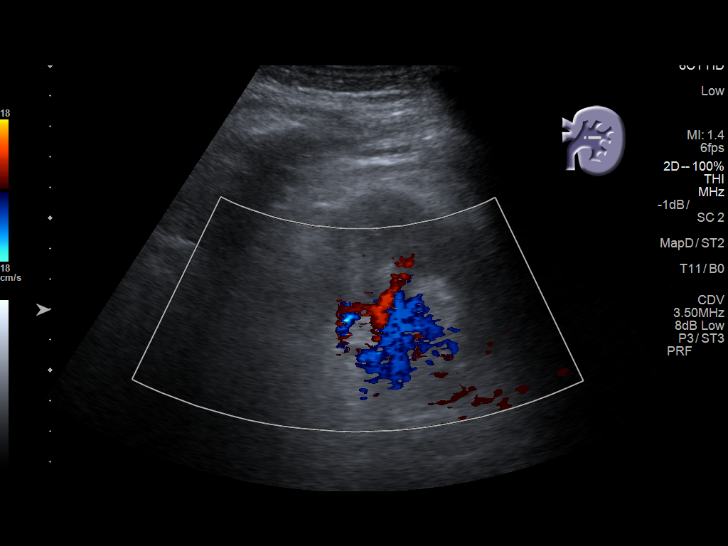
[im 34/37]
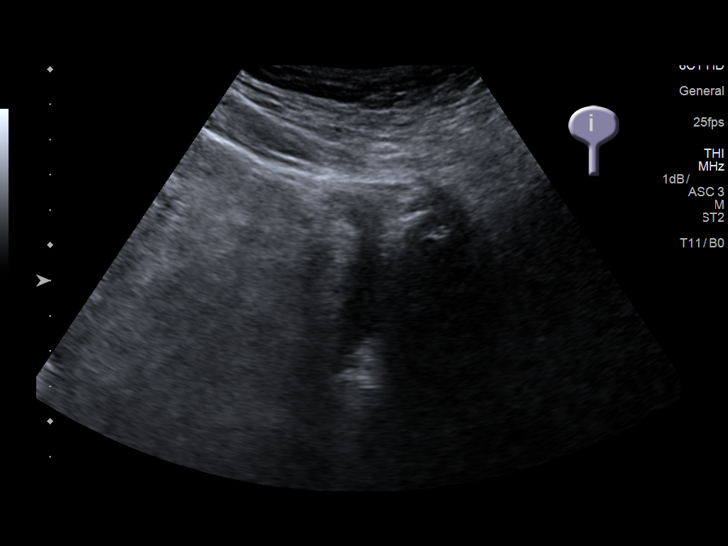
[im 37/37]
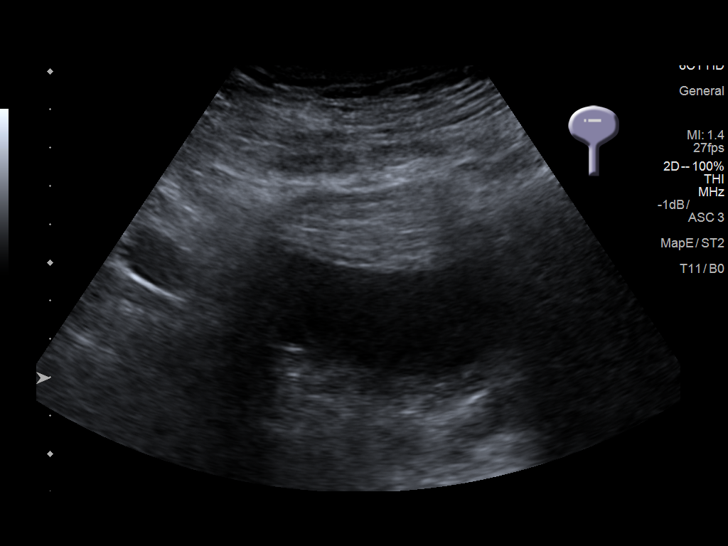

[14 of 25 positions shown; findings below may reference images not displayed]

FINDINGS: Right Kidney:

Length: 8.5 centimeters. Normal thickness of the renal cortex. No
hydronephrosis or mass. Normal echogenicity.

Left Kidney:

Length: 10.4 centimeters. Echogenicity within normal limits. No mass
or hydronephrosis visualized.

Bladder:

Appears normal for degree of bladder distention.

Additional:

Bilateral pleural effusions are noted.
IMPRESSION: 1. The RIGHT kidney is smaller than the LEFT but demonstrates normal
cortical thickness.
2. No hydronephrosis or suspicious renal mass.
3. Bilateral pleural effusions.

## 2019-05-18 NOTE — Telephone Encounter (Signed)
Death certificate complete. [facility] aware ready for pickup.

## 2019-06-01 NOTE — ED Notes (Signed)
Per bloodbank first pink tube hemolyzed. Will send 2nd tube. Notified bloodbank both blood bags currently running.

## 2019-06-01 NOTE — ED Notes (Addendum)
Emergent blood started now at 999cc/hr x2 bags. 3 RNs and 1NT at bedside.

## 2019-06-01 NOTE — Transfer of Care (Signed)
Immediate Anesthesia Transfer of Care Note  Patient: Chris Nguyen  Procedure(s) Performed: ESOPHAGOGASTRODUODENOSCOPY (EGD) (N/A )  Patient Location: PACU  Anesthesia Type:General  Level of Consciousness: Patient remains intubated per anesthesia plan  Airway & Oxygen Therapy: Patient placed on Ventilator (see vital sign flow sheet for setting)  Post-op Assessment: Post -op Vital signs reviewed and unstable, Anesthesiologist notified  Post vital signs: Reviewed and unstable  Last Vitals:  Vitals Value Taken Time  BP 104/73 May 31, 2019 0415  Temp    Pulse 116 05/31/2019 0418  Resp 13 31-May-2019 0418  SpO2 78 % 05/31/19 0418  Vitals shown include unvalidated device data.  Last Pain:  Vitals:   05/16/2019 2330  TempSrc: Oral  PainSc: 3          Complications: No apparent anesthesia complications

## 2019-06-01 NOTE — ED Notes (Signed)
Other meds will be started once received from pharmacy.

## 2019-06-01 NOTE — ED Notes (Signed)
Pt c/o sudden abd pain. EDP Brown at bedside.

## 2019-06-01 NOTE — ED Notes (Signed)
Pt assisted to try and use bedpan and urinate per request.

## 2019-06-01 NOTE — Progress Notes (Signed)
Hinton Dyer, NP notified about patient having no urine output.  Unable to collect ordered specimens.

## 2019-06-01 NOTE — ED Notes (Addendum)
2nd bag of NS hung as a bolus on a pressure bag.

## 2019-06-01 NOTE — ED Notes (Signed)
CXR completed

## 2019-06-01 NOTE — Anesthesia Procedure Notes (Signed)
Procedure Name: Intubation Date/Time: 2019/05/20 3:14 AM Performed by: Lendon Colonel, CRNA Pre-anesthesia Checklist: Patient identified, Patient being monitored, Timeout performed, Emergency Drugs available and Suction available Patient Re-evaluated:Patient Re-evaluated prior to induction Oxygen Delivery Method: Circle system utilized Preoxygenation: Pre-oxygenation with 100% oxygen Induction Type: IV induction Ventilation: Mask ventilation without difficulty Laryngoscope Size: 4 and McGraph Grade View: Grade I Tube type: Oral Tube size: 7.5 mm Number of attempts: 1 Airway Equipment and Method: Stylet Placement Confirmation: ETT inserted through vocal cords under direct vision,  positive ETCO2 and breath sounds checked- equal and bilateral Secured at: 23 cm Tube secured with: Tape Dental Injury: Teeth and Oropharynx as per pre-operative assessment

## 2019-06-01 NOTE — Progress Notes (Signed)
   05/30/19 1000  Clinical Encounter Type  Visited With Family  Visit Type Initial  Referral From Nurse  Consult/Referral To Chaplain  Stress Factors  Family Stress Factors Health changes;Lack of knowledge   Chaplain received a page to support the patient's wife as she awaited an update about her husband's status. Patient's wife was awaiting the arrival of her brother-in-law, who describes as being "better able to ask questions" about her husband's care and treatment. Chaplain provided support through active and reflective listening and compassionate presence. Patient's wife confirms having a supportive network of family whom she can lean on during these times. She is prayerful that her husband will have meaningful recovery and acknowledges that she is hopeful that they will be able to keep the plans they have made and are making together (I.e short and long-term home improvement projects, enjoying life, etc.). At the same time, she acknowledges and is grappling with his critical illness as she thinks about the death of her own mother. Follow-up to support this family is appropriate and welcomed by the patient's wife.   The patient's brother arrived prior to the conclusion of our conversation.

## 2019-06-01 NOTE — Progress Notes (Deleted)
   Patient ID: Chris Nguyen, male    DOB: [birth year], 68 y.o.   MRN: [id number]  HPI  Chris Nguyen is a 68 y/o male with a history of  Echo report from 27-May-2019 reviewed and showed an EF of 10-15% along with moderately dilated pulmonary artery.   Review of Systems    Physical Exam    Assessment & Plan:  1: Chronic heart failure with reduced ejection fraction- - NYHA class

## 2019-06-01 NOTE — ED Notes (Signed)
Chris Nguyen bringing more warm blankets for pt. Pt denies any other needs.

## 2019-06-01 NOTE — Progress Notes (Signed)
*  PRELIMINARY RESULTS* Echocardiogram 2D Echocardiogram has been performed.  Sherrie Sport 05-19-19, 8:42 AM

## 2019-06-01 NOTE — Significant Event (Signed)
Pt continues to decline despite aggressive treatment requiring multiple vasopressors and remains hypotensive.  I spoke with pts wife Jayvon Mounger and pts brother Doc Haris regarding goals of treatment and code status.  They have both stated they would like to change pts code status to DO NOT RESUSCITATE.  Therefore, code status changed to DNR.  I also informed Mr. Carel the pt can have 2 additional family members at bedside.  Will continue to monitor and assess pt.  Marda Stalker, [place] Pager [phone] (please enter 7 digits) PCCM Consult Pager [phone] (please enter 7 digits)

## 2019-06-01 NOTE — ED Notes (Signed)
Pt verbal consent to provide necessary emergent blood.

## 2019-06-01 NOTE — Progress Notes (Signed)
[place] donor services called.  Spoke with Marybelle Killings.  Referral number [id number].  Patient potential tissue and eye donor.

## 2019-06-01 NOTE — ED Notes (Signed)
Charge RN grabbing levophed per EDP [employer].

## 2019-06-01 NOTE — Procedures (Signed)
Arterial Line Placement: Indication: Frequent blood draws; Invasive BP monitoring.   Consent: Emergent.   Hand washing performed prior to starting the procedure.   Procedure: An active timeout was performed and correct patient, name, & ID confirmed. Physicial exam was performed to ensure adequate perfusion.  Using sterile technique, an aterial line was inserted into the RT Femoral artery.  Catheter threaded and the needle was removed with appropriate blood return.  Arterial waveform was noted.  After the procedure, the patient's extremities were observed to be pink and warm.   Estimated Blood Loss: None .   Number of Attempts: 1.   Complications: None .  Operator: Damondre Pfeifle.   Chimene Salo David Leyah Bocchino, M.D.  [facility]  Medical Director ICU-[facility] [place] Medical Director [facility] Cardio-Pulmonary Department     

## 2019-06-01 NOTE — Progress Notes (Signed)
Pt returned from EGD, remains intubated.  Pt extremely hypoxic with SpO2 readings in the 30-40's.  CXR has confirmed proper placement of ETT, concerning for worsening pulmonary edema.  Pt received 40 mg IV Lasix, discussed with Dr. Emmit Alexanders of [facility] and pt given 40 mg more Lasix.  Also discussed with Dr. Patsey Berthold, who recommended vent recruitment maneuvers and placing pt in pressure control mode.  After IV Lasix and recruitment maneuvers, O2 sats improved to the 80's and 90's.  ABG with pO2 in the 400's.  Pt also requiring multiple vasopressors.  Will place central line emergently.   [name], AGACNP-BC [facility] Pager: [phone] Cell: [phone]

## 2019-06-01 NOTE — Anesthesia Preprocedure Evaluation (Addendum)
Anesthesia Evaluation  Patient identified by MRN, date of birth, ID band Patient awake    Reviewed: Allergy & Precautions, H&P , NPO status , Patient's Chart, lab work & pertinent test results, reviewed documented beta blocker date and time   History of Anesthesia Complications Negative for: history of anesthetic complications  Airway Mallampati: III  TM Distance: >3 FB Neck ROM: limited    Dental  (+) Edentulous Lower, Edentulous Upper, Dental Advidsory Given   Pulmonary neg pulmonary ROS, former smoker,           Cardiovascular Exercise Tolerance: Good hypertension, (-) angina+ CAD, + Past MI, + CABG and +CHF  + dysrhythmias Atrial Fibrillation + Cardiac Defibrillator + Valvular Problems/Murmurs (s/p MV repair)      Neuro/Psych negative neurological ROS  negative psych ROS   GI/Hepatic Neg liver ROS, GERD  ,  Endo/Other  neg diabetesHypothyroidism   Renal/GU Renal disease  negative genitourinary   Musculoskeletal   Abdominal   Peds  Hematology negative hematology ROS (+)   Anesthesia Other Findings Past Medical History: No date: AICD (automatic cardioverter/defibrillator) present No date: BPH (benign prostatic hyperplasia) 05/14/2006: CAD (coronary artery disease) 11/12/2013: Cardiac arrest [facility])     Comment:  Overview:  December 2014: ICD implanted  Last Assessment              & Plan:  Relevant Hx: Course: Daily Update: Today's Plan: No date: Chronic systolic CHF (congestive heart failure) (HCC) No date: Erectile dysfunction 05/17/2015: H/O coronary artery bypass surgery     Comment:  Overview:  LIMA to LAD  No date: HLD (hyperlipidemia) No date: Osteoarthritis of left wrist No date: Paroxysmal atrial fibrillation ([place])  Echo from May 2020: The left ventricle has severely reduced systolic function, with an ejection fraction of 20-25%. The cavity size was mildly dilated. Left ventricular diastolic Doppler  parameters are consistent with impaired relaxation.  Reproductive/Obstetrics negative OB ROS                            Anesthesia Physical Anesthesia Plan  ASA: III and emergent  Anesthesia Plan: General   Post-op Pain Management:    Induction: Intravenous, Rapid sequence and Cricoid pressure planned  PONV Risk Score and Plan: 2 and Ondansetron, Dexamethasone and Treatment may vary due to age or medical condition  Airway Management Planned: Oral ETT  Additional Equipment:   Intra-op Plan:   Post-operative Plan:   Informed Consent: I have reviewed the patients History and Physical, chart, labs and discussed the procedure including the risks, benefits and alternatives for the proposed anesthesia with the patient or authorized representative who has indicated his/her understanding and acceptance.     Dental Advisory Given  Plan Discussed with: Anesthesiologist, CRNA and Surgeon  Anesthesia Plan Comments: (Patient with significant cardiac history coming emergently to the Endoscopy suite for GI bleed.  Patient is currently on norepi drip with BP in the 80s/50s.  Will have to be gentle with his anesthetic in this patient with a EF of 20%.  Patient aware of the risks and agrees to proceed.)        Anesthesia Quick Evaluation

## 2019-06-01 NOTE — Progress Notes (Signed)
Ch received a pg to attend to the family of the pt. Family were surprised by the sudden decline in the pt. Family agreed that the pt should be DNR after they reached a level of clarity on the severity of the current condition of the pt. Ch provided words of encouragement and compassionate presence while the family tried to process the sudden decline in the pt. Family was concerned as to why they received conflicting reports w/in a short time frame regarding the pt's status. Pt was not responding well to the aggressive treatment during the time the ch was interacting with the pt and care team. Pt allowed space for family to received the needed updates from the care team and for them to consider the best medical interventions based on the pt's current status.  No further needs at this time.

## 2019-06-01 NOTE — Progress Notes (Signed)
Patient made DNR and family brought to bedside.

## 2019-06-01 NOTE — ED Notes (Signed)
Pt's meds recently changed. Pt started on eliquis and betapace. Pt stopped taking amio/ASA per physician order.

## 2019-06-01 NOTE — Progress Notes (Signed)
Patient hypothermic.  Bair Hugger applied.

## 2019-06-01 NOTE — Consult Note (Signed)
[facility] GI Inpatient Consult Note   Kathline Magic, M.D.  Reason for Consult: Upper gastrointestinal bleed   Attending Requesting Consult: Lance Coon, MD    History of Present Illness: Chris Nguyen is a 68 y.o. male with a history of alcohol abuse, systolic congestive heart failure, coronary artery disease status post remote history of cardiac arrest with need for ICD and placement.  Patient has a history of paroxysmal atrial fibrillation.  The patient presents with acute onset of upper gastrointestinal bleeding last evening prompting ER visit.  He has had several episodes reportedly of hematemesis in the emergency room with hypotension requiring IV pressors.  He appears reasonably alert at this time and is able to answer questions or converse with examiner.  He denies any pain although he has mild dyspnea for which he was given nasal cannula oxygen.  Past Medical History:  Past Medical History:  Diagnosis Date  . AICD (automatic cardioverter/defibrillator) present   . BPH (benign prostatic hyperplasia)   . CAD (coronary artery disease) 05/14/2006  . Cardiac arrest ([place]) 11/12/2013   Overview:  December 2014: ICD implanted  Last Assessment & Plan:  Relevant Hx: Course: Daily Update: Today's Plan:   . Chronic systolic CHF (congestive heart failure) ([place])   . Erectile dysfunction   . H/O coronary artery bypass surgery 05/17/2015   Overview:  LIMA to LAD   . HLD (hyperlipidemia)   . Osteoarthritis of left wrist   . Paroxysmal atrial fibrillation ([place])     Problem List: Patient Active Problem List   Diagnosis Date Noted  . Acute on chronic renal failure ([place]) May 16, 2019  . Hematemesis May 16, 2019  . Hypotension 2019-05-16  . Acute on chronic combined systolic and diastolic CHF (congestive heart failure) ([place]) 04/29/2019  . CHF (congestive heart failure) ([place]) 01/23/2018  . Hypothyroid 01/19/2018  . Allergic rhinitis 11/05/2017  . HLD (hyperlipidemia) 02/25/2016  .  Acute on chronic systolic CHF (congestive heart failure) ([place]) 02/24/2016  . Chronic kidney disease (CKD), stage III (moderate) ([place]) 05/17/2015  . Acid reflux 05/17/2015  . Heart failure, systolic ([place]) 02/63/7858  . Hyperlipidemia   . Hypertension   . Hypertensive cardiomyopathy ([place])   . Cardiomyopathy ([place])   . Foot pain, left   . BPH (benign prostatic hyperplasia)   . Erectile dysfunction   . Left leg weakness   . Encounter for adjustment or management of automatic implantable cardioverter-defibrillator 02/07/2014  . Paroxysmal a-fib ([place]) 12/26/2013  . Automatic implantable cardioverter-defibrillator in situ 12/15/2013  . Osteoarthritis of left wrist 09/27/2010  . CAD (coronary artery disease) 05/14/2006    Past Surgical History: Past Surgical History:  Procedure Laterality Date  . CARDIAC CATHETERIZATION  06/27/2008  . CARPAL TUNNEL RELEASE    . CORONARY ARTERY BYPASS GRAFT  2006 and 2014   LIMA to LAD at [facility] 2014  . MITRAL VALVE REPAIR      Allergies: Allergies  Allergen Reactions  . Spironolactone Other (See Comments)    Hyperkalemia  . Entresto [Sacubitril-Valsartan] Diarrhea  . Ziac [Bisoprolol-Hydrochlorothiazide]     Home Medications: Medications Prior to Admission  Medication Sig Dispense Refill Last Dose  . apixaban (ELIQUIS) 2.5 MG TABS tablet Take 2.5 mg by mouth 2 (two) times daily.   05/16/19 at 1900  . budesonide (PULMICORT) 0.25 MG/2ML nebulizer solution Take 2 mLs (0.25 mg total) by nebulization 2 (two) times daily. 60 mL 12 05/16/19 at Unknown time  . diltiazem (CARDIZEM CD) 120 MG 24 hr capsule Take 1  capsule (120 mg total) by mouth daily. 30 capsule 0 May 26, 2019 at Unknown time  . levothyroxine (SYNTHROID) 25 MCG tablet Take 1 tablet by month in the mornings and 1 tablet by mouth in the evenings. 60 tablet 2 05/26/2019 at 1900  . lisinopril (PRINIVIL,ZESTRIL) 20 MG tablet TAKE 1 TABLET EVERY DAY (Patient taking differently: Take 20 mg by mouth daily.  ) 90 tablet 4 May 26, 2019 at Unknown time  . sotalol (BETAPACE) 80 MG tablet Take 80 mg by mouth 2 (two) times daily.   2019/05/26 at 1700  . torsemide (DEMADEX) 20 MG tablet Take 2 tablets (40 mg total) by mouth 2 (two) times daily. 60 tablet 0 May 26, 2019 at Unknown time  . amiodarone (PACERONE) 200 MG tablet Take 200 mg by mouth See admin instructions. Take 1 tablet (200mg ) by mouth twice daily for 2 weeks then take 1 tablet (200mg ) by mouth daily   Not Taking at Unknown time  . aspirin 81 MG EC tablet Take 81 mg by mouth daily.    Not Taking at Unknown time  . atorvastatin (LIPITOR) 80 MG tablet TAKE 1 TABLET EVERY DAY (Patient not taking: Reported on 04/29/2019) 90 tablet 3 Not Taking at Unknown time  . predniSONE (STERAPRED UNI-PAK 21 TAB) 10 MG (21) TBPK tablet Start at 60mg  taper by 10mg  until complete (Patient not taking: Reported on 2019-05-26) 21 tablet 0 Not Taking at Unknown time  . traMADol (ULTRAM) 50 MG tablet Take 100 mg by mouth every 12 (twelve) hours as needed for moderate pain.   Not Taking at Unknown time   Home medication reconciliation was completed with the patient.   Scheduled Inpatient Medications:    Continuous Inpatient Infusions:   . coag fact Xa recombinant (ANDEXXA) IV    . DOPamine    . DOPamine 10 mcg/kg/min (05/26/19 0059)    PRN Inpatient Medications:    Family History: family history includes CAD (age of onset: 24) in his mother; Dementia in his father; Diabetes in his mother; Glaucoma in his father; Heart disease in an other family member.   GI Family History: Negative  Social History:   reports that he quit smoking about 14 years ago. He has never used smokeless tobacco. He reports current alcohol use of about 7.0 - 9.0 standard drinks of alcohol per week. He reports that he does not use drugs. The patient denies ETOH, tobacco, or drug use.    Review of Systems: Review of Systems - Negative except History of present illness  Physical Examination: BP  (!) 68/57   Pulse (!) 143   Temp 98.2 F (36.8 C) (Oral)   Resp 19   Ht 5\' 10"  (1.778 m)   Wt 83 kg   SpO2 95%   BMI 26.26 kg/m  Physical Exam  Data: Lab Results  Component Value Date   WBC 11.5 (H) 05/09/2019   HGB 17.8 (H) 05/03/2019   HCT 54.5 (H) 05/31/2019   MCV 96.0 05/20/2019   PLT 159 05/06/2019   Recent Labs  Lab 05/26/2019 2342  HGB 17.8*   Lab Results  Component Value Date   NA 140 05/15/2019   K 3.5 05/27/2019   CL 96 (L) 05/30/2019   CO2 31 05/31/2019   BUN 29 (H) 05/07/2019   CREATININE 2.48 (H) 05/06/2019   GLU 86 05/12/2014   Lab Results  Component Value Date   ALT 82 (H) 05/17/2019   AST 92 (H) 05/10/2019   ALKPHOS 335 (H) 05/04/2019   BILITOT 1.7 (  H) 05/30/2019   Recent Labs  Lab 05/17/2019 2342  INR 1.3*   CBC Latest Ref Rng & Units 05/15/2019 04/30/2019 04/29/2019  WBC 4.0 - 10.5 K/uL 11.5(H) 7.7 8.0  Hemoglobin 13.0 - 17.0 g/dL 17.8(H) 18.0(H) 17.6(H)  Hematocrit 39.0 - 52.0 % 54.5(H) 52.7(H) 51.8  Platelets 150 - 400 K/uL 159 129(L) 132(L)    STUDIES: Dg Chest Portable 1 View  Result Date: 05-25-2019 CLINICAL DATA:  Dyspnea EXAM: PORTABLE CHEST 1 VIEW COMPARISON:  Chest x-ray dated 04/29/2019 FINDINGS: The heart size is enlarged. There are small bilateral pleural effusions, right greater than left. There is generalized volume overload with early developing pulmonary edema. A left-sided ICD noted. The patient is status post prior median sternotomy. There is no pneumothorax. IMPRESSION: 1. Cardiomegaly with developing pulmonary edema. 2. Small right-sided pleural effusion, increased from prior study. Electronically Signed   By: Constance Holster M.D.   On: 05/25/2019 00:31   @IMAGES @  Assessment:  #1.  Upper GI bleed-differential diagnosis includes variceal bleeding, Mallory-Weiss tear, peptic ulcer disease.  2.  History of alcohol abuse.  3.  Congestive heart failure with elevated BNP in the 400s.  4. CKD with Cr > 2.  COVID-19  status: Tested negative     Recommendations:  1. Emergency EGD with possible biopsy and/or hemostasis. The patient understands the nature of the planned procedure, indications, risks, alternatives and potential complications including but not limited to bleeding, infection, perforation, damage to internal organs and possible oversedation/side effects from anesthesia. The patient agrees and gives consent to proceed.  Please refer to procedure notes for findings, recommendations and patient disposition/instructions.  Thank you for the consult. Please call with questions or concerns.  Olean Ree, "Lanny Hurst MD [facility] [place], [place] [address] [phone]  2019/05/25 2:53 AM

## 2019-06-01 NOTE — Anesthesia Postprocedure Evaluation (Signed)
Anesthesia Post Note  Patient: Chris Nguyen  Procedure(s) Performed: ESOPHAGOGASTRODUODENOSCOPY (EGD) (N/A )  Patient location during evaluation: ICU Anesthesia Type: General Level of consciousness: sedated Pain management: pain level controlled Vital Signs Assessment: post-procedure vital signs reviewed and stable Respiratory status: patient on ventilator - see flowsheet for VS Cardiovascular status: blood pressure returned to baseline and stable Postop Assessment: no apparent nausea or vomiting Anesthetic complications: no Comments: Patient remains intubated/on ventilator     Last Vitals:  Vitals:   2019-06-11 0452 06-11-19 0504  BP: (!) 148/67   Pulse: (!) 129   Resp: 17   Temp:    SpO2: (!) 66% 96%    Last Pain:  Vitals:   05/19/2019 2330  TempSrc: Oral  PainSc: 3                  Alison Stalling

## 2019-06-01 NOTE — Procedures (Addendum)
Central Venous Catheter Insertion Procedure Note Chris Nguyen [id number] Jun 24, 1951  Procedure: Insertion of Central Venous Catheter Indications: Assessment of intravascular volume, Drug and/or fluid administration and Frequent blood sampling  Procedure Details Consent: Unable to obtain consent because of emergent medical necessity. Time Out: Verified patient identification, verified procedure, site/side was marked, verified correct patient position, special equipment/implants available, medications/allergies/relevent history reviewed, required imaging and test results available.  Performed  Maximum sterile technique was used including antiseptics, cap, gloves, gown, hand hygiene, mask and sheet. Skin prep: Chlorhexidine; local anesthetic administered A antimicrobial bonded/coated triple lumen catheter was placed in the left femoral vein due to emergent situation using the Seldinger technique.  Evaluation Blood flow good Complications: No apparent complications Patient did tolerate procedure well. Chest X-ray ordered to verify placement.  CXR: Not applicable, placed in left femoral vein.   Procedure was performed using Ultrasound for direct visualization of cannulization of left femoral vein.    Chris Nguyen, AGACNP-BC [facility] Pager: [phone] Cell: [phone]  Chris Nguyen 06-06-19, 7:49 AM

## 2019-06-01 NOTE — Progress Notes (Signed)
Patient hypotensive and maxed out on Vasopressin, levophed, and dopamine.  Issue discussed with Dr. Mortimer Fries.  Per [place] start neo synephrine and titrate dopamine as patient tolerates.

## 2019-06-01 NOTE — ED Notes (Signed)
Per bloodbank pt's blood confirmed compatible with blood given.

## 2019-06-01 NOTE — ED Notes (Signed)
EDP Brown notified in person of pt's continued hypotension.

## 2019-06-01 NOTE — Consult Note (Signed)
Name: Chris Nguyen MRN: [id number] DOB: May 09, 1951    ADMISSION DATE:  05/09/2019 CONSULTATION DATE:  05/29/19  REFERRING MD : Dr. Jannifer Franklin  CHIEF COMPLAINT:  Hematemesis  BRIEF PATIENT DESCRIPTION:  68 year old male with a past medical history notable for alcohol abuse, CAD status post AICD, chronic combined systolic and diastolic CHF, paroxysmal atrial fib recently started on Eliquis, and BPH admitted with hemorrhagic shock in the setting of acute upper GI bleed requiring blood transfusions and vasopressors.  GI is taking patient for emergent EGD.   SIGNIFICANT EVENTS  6/11>>Admission to Stepdown 6/11>> Emergent EGD  STUDIES:  EGD 6/11>> CT Abdomen & Pelvis 6/11>>  CULTURES: SARS-CoV-2 PCR 6/11>> Negative  ANTIBIOTICS: N/A  HISTORY OF PRESENT ILLNESS:   68 year old male with a past medical history notable for alcohol abuse, CAD status post AICD, chronic combined systolic and diastolic CHF, paroxysmal atrial fib recently started on Eliquis, and BPH who presented to [facility] ED on 05/30/2019 with complaints of hematemesis, generalized weakness, and dyspnea.  He reported generalized abdominal discomfort, but denied chest pain.  Upon presentation to the ED he was noted to be hypotensive with several witnessed episodes of hematemesis in the ED. Of note the patient was started on Eliquis today for his paroxysmal atrial fib.  Initial work-up in the ED revealed INR 1.3, PT 16.2, alkaline phosphatase 335, AST 92, ALT 82, WBC 11.5, hemoglobin 17.8, creatinine 2.48.  His COVID-19 PCR is currently pending.  In the ED he received 2 units of blood emergently and was placed on vasopressors.  He also received a dose of Andexxa.  He is being admitted to stepdown unit for further work-up and treatment of hemorrhagic shock in the setting of acute upper GI bleed.  GI is now at bedside in the ICU with plans for emergent EGD.  PCCM is consulted for further management.   PAST MEDICAL HISTORY :   has a past  medical history of AICD (automatic cardioverter/defibrillator) present, BPH (benign prostatic hyperplasia), CAD (coronary artery disease) (05/14/2006), Cardiac arrest ([place]) (40/08/2724), Chronic systolic CHF (congestive heart failure) ([place]), Erectile dysfunction, H/O coronary artery bypass surgery (05/17/2015), HLD (hyperlipidemia), Osteoarthritis of left wrist, and Paroxysmal atrial fibrillation ([place]).  has a past surgical history that includes Cardiac catheterization (06/27/2008); Coronary artery bypass graft (2006 and 2014); Mitral valve repair; and Carpal tunnel release. Prior to Admission medications   Medication Sig Start Date End Date Taking? Authorizing Provider  apixaban (ELIQUIS) 2.5 MG TABS tablet Take 2.5 mg by mouth 2 (two) times daily.   Yes [provider]  budesonide (PULMICORT) 0.25 MG/2ML nebulizer solution Take 2 mLs (0.25 mg total) by nebulization 2 (two) times daily. 05/02/19  Yes Dustin Flock, MD  diltiazem (CARDIZEM CD) 120 MG 24 hr capsule Take 1 capsule (120 mg total) by mouth daily. 05/02/19  Yes Dustin Flock, MD  levothyroxine (SYNTHROID) 25 MCG tablet Take 1 tablet by month in the mornings and 1 tablet by mouth in the evenings. 05/03/19  Yes Birdie Sons, MD  lisinopril (PRINIVIL,ZESTRIL) 20 MG tablet TAKE 1 TABLET EVERY DAY Patient taking differently: Take 20 mg by mouth daily.  05/13/18  Yes Birdie Sons, MD  sotalol (BETAPACE) 80 MG tablet Take 80 mg by mouth 2 (two) times daily.   Yes [provider]  torsemide (DEMADEX) 20 MG tablet Take 2 tablets (40 mg total) by mouth 2 (two) times daily. 05/02/19  Yes Dustin Flock, MD  amiodarone (PACERONE) 200 MG tablet Take 200 mg by mouth  See admin instructions. Take 1 tablet (200mg ) by mouth twice daily for 2 weeks then take 1 tablet (200mg ) by mouth daily 04/26/19   [provider]  aspirin 81 MG EC tablet Take 81 mg by mouth daily.     [provider]  atorvastatin (LIPITOR) 80 MG tablet  TAKE 1 TABLET EVERY DAY Patient not taking: Reported on 04/29/2019 02/05/16   Birdie Sons, MD  predniSONE (STERAPRED UNI-PAK 21 TAB) 10 MG (21) TBPK tablet Start at 60mg  taper by 10mg  until complete Patient not taking: Reported on 05-30-19 05/02/19   Dustin Flock, MD  traMADol (ULTRAM) 50 MG tablet Take 100 mg by mouth every 12 (twelve) hours as needed for moderate pain.    [provider]   Allergies  Allergen Reactions  . Spironolactone Other (See Comments)    Hyperkalemia  . Entresto [Sacubitril-Valsartan] Diarrhea  . Ziac [Bisoprolol-Hydrochlorothiazide]     FAMILY HISTORY:  family history includes CAD (age of onset: 35) in his mother; Dementia in his father; Diabetes in his mother; Glaucoma in his father; Heart disease in an other family member. SOCIAL HISTORY:  reports that he quit smoking about 14 years ago. He has never used smokeless tobacco. He reports current alcohol use of about 7.0 - 9.0 standard drinks of alcohol per week. He reports that he does not use drugs.   REVIEW OF SYSTEMS: Positives in bold Constitutional: Negative for fever, chills, weight loss, malaise/fatigue and diaphoresis.  HENT: Negative for hearing loss, ear pain, nosebleeds, congestion, sore throat, neck pain, tinnitus and ear discharge.   Eyes: Negative for blurred vision, double vision, photophobia, pain, discharge and redness.  Respiratory: Negative for cough, hemoptysis, sputum production, shortness of breath, wheezing and stridor.   Cardiovascular: Negative for chest pain, palpitations, orthopnea, claudication, leg swelling and PND.  Gastrointestinal: Negative for heartburn, nausea, vomiting, abdominal pain, diarrhea, constipation, blood in stool and melena.  Genitourinary: Negative for dysuria, urgency, frequency, hematuria and flank pain.  Musculoskeletal: Negative for myalgias, back pain, joint pain and falls.  Skin: Negative for itching and rash.  Neurological: Negative for  dizziness, tingling, tremors, sensory change, speech change, focal weakness, seizures, loss of consciousness, weakness and headaches.  Endo/Heme/Allergies: Negative for environmental allergies and polydipsia. Does not bruise/bleed easily.  SUBJECTIVE:   VITAL SIGNS: Temp:  [98.2 F (36.8 C)] 98.2 F (36.8 C) (06/10 2330) Pulse Rate:  [36-143] 143 (06/11 0016) Resp:  [15-34] 19 (06/11 0016) BP: (65-108)/(44-88) 68/57 (06/11 0016) SpO2:  [94 %-100 %] 95 % (06/11 0017) Weight:  [83 kg] 83 kg (06/10 2331)  PHYSICAL EXAMINATION:  GENERAL:critically ill appearing, +resp distress HEAD: Normocephalic, atraumatic.  EYES: Pupils equal, round, reactive to light.  No scleral icterus.  MOUTH: Moist mucosal membrane. NECK: Supple. No thyromegaly. No nodules. No JVD.  PULMONARY: +rhonchi, +wheezing CARDIOVASCULAR: S1 and S2. Regular rate and rhythm. No murmurs, rubs, or gallops.  GASTROINTESTINAL: Soft, nontender, -distended. No masses. Positive bowel sounds. No hepatosplenomegaly.  MUSCULOSKELETAL: No swelling, clubbing, or edema.  NEUROLOGIC: obtunded SKIN:intact,warm,dry  REVIEW OF SYSTEMS  PATIENT IS UNABLE TO PROVIDE COMPLETE REVIEW OF SYSTEM S DUE TO SEVERE CRITICAL ILLNESS AND ENCEPHALOPATHY   Recent Labs  Lab 05/03/2019 2342  NA 140  K 3.5  CL 96*  CO2 31  BUN 29*  CREATININE 2.48*  GLUCOSE 164*   Recent Labs  Lab 05/23/2019 2342  HGB 17.8*  HCT 54.5*  WBC 11.5*  PLT 159   Dg Chest Portable 1 View  Result Date:  2019-05-23 CLINICAL DATA:  Dyspnea EXAM: PORTABLE CHEST 1 VIEW COMPARISON:  Chest x-ray dated 04/29/2019 FINDINGS: The heart size is enlarged. There are small bilateral pleural effusions, right greater than left. There is generalized volume overload with early developing pulmonary edema. A left-sided ICD noted. The patient is status post prior median sternotomy. There is no pneumothorax. IMPRESSION: 1. Cardiomegaly with developing pulmonary edema. 2. Small  right-sided pleural effusion, increased from prior study. Electronically Signed   By: Constance Holster M.D.   On: May 23, 2019 00:31      Indwelling Urinary Catheter continued, requirement due to   Reason to continue Indwelling Urinary Catheter strict Intake/Output monitoring for hemodynamic instability   Central Line/ continued, requirement due to  Reason to continue [place] of central venous pressure or other hemodynamic parameters and poor IV access   Ventilator continued, requirement due to severe respiratory failure   Ventilator Sedation RASS 0 to -2          ASSESSMENT / PLAN:  Hemorrhagic shock in the setting of GI bleed Acute on Chronic Combined Systolic & Diastolic CHF Hx: CAD, Paroxysmal A-fib, HLD -Cardiac monitoring -Maintain MAP greater than 65 -Transfusion is indicated -Levophed as needed to maintain MAP goal -Follow BNP -Unable to diurese at this time given hypotension and vasopressor requirements  Acute upper GI bleed Hx: GERD, ETOH abuse -NPO -Protonix & Octreotide drips -GI consulted, appreciate input>> Pt taken for emergent EGD 6/11  -Hold Eliquis -Received dose of Andexxa  Anemia in the setting of acute blood loss -Monitor for S/Sx of bleeding -Trend CBC -SCD's for VTE Prophylaxis (No chemical prophylaxis given GI Bleed)  -Transfuse for Hgb <8  Acute hypoxic respiratory failure in setting of pulmonary edema -Supplemental O2 as needed to maintain O2 sats greater than 92% -Follow intermittent ABG and chest x-ray -Unable to diurese at this time given hypotension requiring vasopressors -High risk for intubation given CHF and high risk for aspiration given GI bleed  AKI -Monitor I&O's / urinary output -Follow BMP -Ensure adequate renal perfusion -Avoid nephrotoxic agents as able -Replace electrolytes as indicated     PCCM ATTENDING ATTESTATION:  I have evaluated patient with the APP, I personally  reviewed database in its  entirety and discussed care plan in detail. In addition, this patient was discussed on multidisciplinary rounds.   I agree with assessment and plan.  Important exam findings: On vent Critically ill appearing    Severe ACUTE Hypoxic and Hypercapnic Respiratory Failure -continue Mechanical Ventilator support -continue Bronchodilator Therapy -Wean Fio2 and PEEP as tolerated -VAP/VENT bundle implementation  Septic/Cardiogenic/Hypovolmic  shock -use vasopressors to keep MAP>65 -follow ABG and LA -follow up cultures -emperic ABX - stress dose steroids -aggressive IV fluid Resuscitation  GIB-varices, bleeding ulcer protonix  ELECTROLYTES -follow labs as needed -replace as needed -pharmacy consultation and following  ACUTE KIDNEY INJURY/Renal Failure -follow chem 7 -follow UO -continue Foley Catheter-assess need -Avoid nephrotoxic agents -Recheck creatinine      DVT/GI PRX ordered TRANSFUSIONS AS NEEDED MONITOR FSBS ASSESS the need for LABS as needed   ACUTE ANEMIA- TRANSFUSE AS NEEDED DVT PRX with TED/SCD's ONLY     Critical Care Time devoted to patient care services described in this note is 34 minutes.   Overall, patient is critically ill, prognosis is guarded.  Patient with Multiorgan failure and at high risk for cardiac arrest and death.    Corrin Parker, M.D.  [facility]  Medical Director [place] Director  [facility] Cardio-Pulmonary Department

## 2019-06-01 NOTE — Progress Notes (Signed)
Dana at bedside placing right femoral arterial line.

## 2019-06-01 NOTE — Anesthesia Post-op Follow-up Note (Signed)
Anesthesia QCDR form completed.        

## 2019-06-01 NOTE — Progress Notes (Signed)
Neo synephrine infusing at 40 mcg and dopamine titrated off.  Patient blood pressure stable per art line and manual blood pressure reading.  Fentanyl stated at 50 mcg per NP order.

## 2019-06-01 NOTE — ED Notes (Addendum)
2nd emergent bag of blood complete. Pt assisted off bedpan. Pt didn't have BM. Pt was unable to urinate into urinal either. Pt continues to c/o severe abd pain.

## 2019-06-01 NOTE — Progress Notes (Signed)
Patient accepted from [facility].  Patient hypotensive on Levophed, vasopressin, dopamine.  Patient intubated and sedated on vent with precedex.  Jeremiah at bedside attempting arterial line placement. Per Darlyn Chamber titrate levophed up and if no response titrate dopamine to achieve stable blood pressure.

## 2019-06-01 NOTE — ED Notes (Signed)
First bag of emergent blood complete.

## 2019-06-01 NOTE — Progress Notes (Signed)
Initial Nutrition Assessment  RD working remotely.  DOCUMENTATION CODES:   Not applicable  INTERVENTION:  Patient not appropriate for enteral nutrition at this time in setting of active GI bleed requiring blood transfusions and multiple pressors.  Once patient more stable, appropriate to feed enterally, and able to have enteral access placed, recommend initiating Vital 1.5 Cal at 40 mL/hr (960 mL goal daily volume) + Pro-Stat 60 mL BID. Provides 1840 kcal, 125 grams of protein, 730 mL H2O daily.  If tube feeds are initiated provide liquid MVI daily per tube and a minimum free water flush of 30 mL Q4hrs.  NUTRITION DIAGNOSIS:   Inadequate oral intake related to inability to eat as evidenced by NPO status.  GOAL:   Provide needs based on ASPEN/SCCM guidelines  MONITOR:   Vent status, Labs, Weight trends, TF tolerance, Skin, I & O's  REASON FOR ASSESSMENT:   Ventilator    ASSESSMENT:   68 year old male with PMHx of CAD, OA, BPH, hx cardiac arrest in 2014 s/p placement of AICD, hx CABG 1751, chronic systolic CHF, HLD, EtOH abuse admitted with hemorrhagic shock in setting of upper GI bleed.   Patient intubated and sedated. On PCV mode with FiO2 100%, Pressure Control 30 cmH2O, PEEP 10 cmH2O. Last BM unknown/PTA. Patient underwent emergent EGD early this AM. Findings include grade I esophageal varices, medium amount of food in the stomach, oozing duodenal ulcer with visible vessel (treatment not successful), 1 cm hiatal hernia.  Enteral Access: none documented  MAP: 30-135 mmHg  Patient is currently intubated on ventilator support Ve: 12.5 L/min Temp (24hrs), Avg:96.5 F (35.8 C), Min:94.7 F (34.8 C), Max:98.2 F (36.8 C)  Propofol: N/A  Medications reviewed and include: Solu-Cortef 50 mg Q6hrs IV, pantoprazole, amiodarone gtt, epinephrine gtt not yet started, fentanyl gtt, norepinephrine gtt at 65 mcg/min, phenylephrine gtt at 10 mcg/min, Zosyn, banana bag, vasopressin  gtt at 0.04 units/min.  Labs reviewed: CBG <10-256, Potassium 5.2, BUN 29, Creatinine 2.97.  NUTRITION - FOCUSED PHYSICAL EXAM:  Unable to complete at this time.  Diet Order:   Diet Order            Diet NPO time specified  Diet effective now             EDUCATION NEEDS:   No education needs have been identified at this time  Skin:  Skin Assessment: Reviewed RN Assessment  Last BM:  Unknown/PTA  Height:   Ht Readings from Last 1 Encounters:  05/05/2019 5\' 10"  (1.778 m)   Weight:   Wt Readings from Last 1 Encounters:  05/02/2019 83 kg   Ideal Body Weight:  75.5 kg  BMI:  Body mass index is 26.26 kg/m.  Estimated Nutritional Needs:   Kcal:  1868 (PSU 2003b w/ MSJ 1611, Ve 12.5, Tmax 36.8)  Protein:  100-125 grams (1.2-1.5 grams/kg)  Fluid:  2-2.4 L/day  Willey Blade, MS, RD, LDN Office: [phone] Pager: [phone] After Hours/Weekend Pager: [phone]

## 2019-06-01 NOTE — Progress Notes (Signed)
Amiodarone drip started for runs of v-tach.

## 2019-06-01 NOTE — Progress Notes (Signed)
Patient continuing to decompensate. Patient hypoglycemic.  D50 administered.  3 amps of bicarb administered.  Labs drawn and sent.  Blood gas drawn.

## 2019-06-01 NOTE — Op Note (Signed)
[facility] Gastroenterology Patient Name: Chris Nguyen Procedure Date: 2019/05/31 2:28 AM MRN: [id number] Account #: [id number] Date of Birth: [birth year] Admit Type: Inpatient Age: 68 Room: [facility] ENDO ROOM 3 Gender: Male Note Status: Finalized Procedure:            Upper GI endoscopy Indications:          Hematemesis Providers:            Benay Pike. Alice Reichert MD, MD Referring MD:         No Local Md, MD (Referring MD) Medicines:            General Anesthesia Complications:        No immediate complications. Procedure:            Pre-Anesthesia Assessment:                       - The risks and benefits of the procedure and the                        sedation options and risks were discussed with the                        patient. All questions were answered and informed                        consent was obtained.                       - Patient identification and proposed procedure were                        verified prior to the procedure by the nurse. The                        procedure was verified in the procedure room.                       - ASA Grade Assessment: III - A patient with severe                        systemic disease.                       - After reviewing the risks and benefits, the patient                        was deemed in satisfactory condition to undergo the                        procedure.                       After obtaining informed consent, the endoscope was                        passed under direct vision. Throughout the procedure,                        the patient's blood pressure, pulse, and oxygen  saturations were monitored continuously. The Endoscope                        was introduced through the mouth, and advanced to the                        third part of duodenum. The upper GI endoscopy was                        accomplished without difficulty. The patient tolerated   the procedure well. Findings:      Grade I varices were found in the middle third of the esophagus and in       the lower third of the esophagus. They were 5 mm in largest diameter.      There is no endoscopic evidence of red wale, cherry red spots or other       stigmata of bleeding in the entire esophagus.      A medium amount of food (residue) was found in the gastric fundus.      Patchy mild mucosal changes characterized by erosion and friability       (with spontaneous bleeding) were found in the duodenal bulb.      One oozing superficial duodenal ulcer with a visible vessel was found in       the first portion of the duodenum. The lesion was 4 mm in largest       dimension. Area was unsuccessfully injected with 1 mL of a 1:10,000       solution of epinephrine for hemostasis. Coagulation for hemostasis using       bipolar probe was successful.      A 1 cm hiatal hernia was present.      Mild portal hypertensive gastropathy was found in the entire examined       stomach. Impression:           - Grade I esophageal varices.                       - A medium amount of food (residue) in the stomach.                       - Mucosal changes in the duodenum.                       - Oozing duodenal ulcer with a visible vessel.                        Treatment not successful. Treated with bipolar cautery.                       - 1 cm hiatal hernia.                       - No specimens collected. Recommendation:       - Return patient to ICU for observation.                       - NPO. Procedure Code(s):    --- Professional ---                       [phone], Esophagogastroduodenoscopy, flexible, transoral;  with control of bleeding, any method Diagnosis Code(s):    --- Professional ---                       K92.0, Hematemesis                       K44.9, Diaphragmatic hernia without obstruction or                        gangrene                       K26.4, Chronic or  unspecified duodenal ulcer with                        hemorrhage                       K31.89, Other diseases of stomach and duodenum                       I85.00, Esophageal varices without bleeding CPT copyright 2019 [employer]. All rights reserved. The codes documented in this report are preliminary and upon coder review may  be revised to meet current compliance requirements. [name] MD, MD May 25, 2019 3:50:32 AM This report has been signed electronically. Number of Addenda: 0 Note Initiated On: May 25, 2019 2:28 AM Estimated Blood Loss: Estimated blood loss was minimal.      [facility]

## 2019-06-01 NOTE — Death Summary Note (Signed)
DEATH SUMMARY   Patient Details  Name: Chris Nguyen MRN: [id number] DOB: 1950-12-08  Admission/Discharge Information   Admit Date:  05/19/19  Date of Death: Date of Death: 05/20/2019  Time of Death: Time of Death: 1338-03-02  Length of Stay: 0  Referring Physician: Birdie Sons, MD   Reason(s) for Hospitalization  Hematemesis   Diagnoses  Preliminary cause of death: Ischemic Cardiomyopathy Secondary Diagnoses (including complications and co-morbidities):  Principal Problem:   Hematemesis Active Problems:   CAD (coronary artery disease)   Paroxysmal a-fib (HCC)   Acid reflux   HLD (hyperlipidemia)   CHF (congestive heart failure) ([place])   Acute on chronic renal failure (HCC)   Hypotension   Acute respiratory failure (HCC)   Hypotension secondary to cardiogenic shock and possible septic shock    AICD    Chronic systolic diastolic CHF    Alcohol Abuse    Acute hypoxic respiratory failure secondary to aspiration pneumonia  Brief Hospital Course (including significant findings, care, treatment, and services provided and events leading to death)  Chris Nguyen is a 68 y.o. year old male who presented to [facility] ED on 2019-05-19 with complaints of hematemesis, generalized weakness, and dyspnea.  He reported generalized abdominal discomfort, but denied chest pain. Upon presentation to the ED pt hypotensive. He was started on Eliquis on 05/10/2019 per outpatient Cardiologist recommendations due to paroxysmal atrial fib.  Initial work-up in the ED revealed INR 1.3, PT 16.2, alkaline phosphatase 335, AST 92, ALT 82, WBC 11.5, hemoglobin 17.8, creatinine 2.48, and COVID-19 negative.  In the ED he received 2 units of blood emergently and was placed on vasopressors.  He also received a dose of Andexxa for Eliquis reversal.  He was subsequently admitted to the stepdown unit for additional workup and treatment.  Gastroenterology consulted pt underwent emergent upper endoscopy which he was intubated  prior to procedure.  EGD revealed an oozing superficial duodenal ulcer with a  visible vessel was found in the first portion of the duodenum. The lesion was 4 mm in largest dimension. Area was unsuccessfully injected with 1 mL of a 1:10,000 solution of epinephrine for hemostasis. Coagulation for hemostasis using bipolar probe was successful.  He remained intubated post procedure.  Pt continually declined post procedure requiring maximum doses of multiple vasopressors due to hypotension. Echocardiogram revealed worsening chronic systolic diastolic CHF with EF of 81%-44%. Due to poor prognosis pts family  arrived at bedside.  After further discussion regarding goals of care and pts poor prognosis pts family changed his code status to DO NOT RESUSCITATE.  The pt expired on 05-20-2019 at 1339.  Pertinent Labs and Studies  Significant Diagnostic Studies Ct Abdomen Pelvis Wo Contrast  Result Date: 04/30/2019 CLINICAL DATA:  Heart failure short of breath abdominal fullness EXAM: CT CHEST, ABDOMEN AND PELVIS WITHOUT CONTRAST TECHNIQUE: Multidetector CT imaging of the chest, abdomen and pelvis was performed following the standard protocol without IV contrast. COMPARISON:  Ultrasound 04/30/2019, chest x-ray 04/29/2019 FINDINGS: CT CHEST FINDINGS Cardiovascular: Moderate aortic atherosclerosis without aneurysm. Extensive coronary vascular calcification. Cardiomegaly. No significant pericardial effusion. Post CABG changes. Left-sided cardiac pacing device with single lead tip at the right ventricle. Mediastinum/Nodes: Midline trachea. No thyroid mass. No significant adenopathy. Esophagus shows mild air distention but is otherwise unremarkable Lungs/Pleura: Moderate emphysema. Trace left pleural effusion. Small moderate right pleural effusion. No focal consolidation. 3 mm subpleural left upper lobe lung nodule, series 4, image number 74. Musculoskeletal: Post sternotomy changes. No acute or suspicious osseous  lesion CT  ABDOMEN PELVIS FINDINGS Hepatobiliary: No focal hepatic abnormality. Hyperdense sludge or small stones in the gallbladder. No biliary dilatation Pancreas: Unremarkable. No pancreatic ductal dilatation or surrounding inflammatory changes. Spleen: Normal in size without focal abnormality. Adrenals/Urinary Tract: Adrenal glands are normal. No hydronephrosis. Scarring and mild atrophy of the right kidney, most prominent involving the lower pole. The bladder is unremarkable. Stomach/Bowel: Stomach is within normal limits. Appendix appears normal. No evidence of bowel wall thickening, distention, or inflammatory changes. Vascular/Lymphatic: Moderate aortic atherosclerosis. Focal ectasia of the distal infrarenal abdominal aorta measuring up to 2.8 cm. No significantly enlarged lymph nodes Reproductive: Slightly enlarged prostate with scattered calcification Other: No free air. Small amount of free fluid in the pelvis. Trace perihepatic free fluid. Musculoskeletal: Degenerative changes of the spine with retrolisthesis of L4 on L5. No acute or suspicious osseous abnormality. IMPRESSION: 1. Cardiomegaly with trace left pleural effusion and small moderate right pleural effusion. No focal airspace consolidation. 2. Moderate emphysema. 3 mm left upper lobe lung nodule. No follow-up needed if patient is low-risk. Non-contrast chest CT can be considered in 12 months if patient is high-risk. This recommendation follows the consensus statement: Guidelines for Management of Incidental Pulmonary Nodules Detected on CT Images: From the Fleischner Society 2017; Radiology 2017; 284:228-243. 3. Small amount of free fluid in the pelvis and adjacent to the liver. 4. Gallbladder sludge versus small stones without acute inflammatory change 5. Scarring and atrophy of right kidney 6. Distal infrarenal abdominal aorta measures up to 2.8 cm. Ectatic abdominal aorta at risk for aneurysm development. Recommend followup by ultrasound in 5 years.  This recommendation follows ACR consensus guidelines: White Paper of the ACR Incidental Findings Committee II on Vascular Findings. J Am Coll Radiol 2013; 10:789-794. Electronically Signed   By: Donavan Foil M.D.   On: 04/30/2019 19:23   Dg Chest 1 View  Result Date: 04/29/2019 CLINICAL DATA:  Shortness of breath. Lower extremity swelling. EXAM: CHEST  1 VIEW COMPARISON:  01/28/2018 FINDINGS: Prior CABG, an ICD, cardiomegaly, and aortic atherosclerosis are again noted. There is central pulmonary vascular congestion without overt edema. No airspace consolidation, sizeable pleural effusion, pneumothorax is identified. No acute osseous abnormality is seen. IMPRESSION: Cardiomegaly and pulmonary vascular congestion. Electronically Signed   By: Logan Bores M.D.   On: 04/29/2019 15:39   Ct Chest Wo Contrast  Result Date: 04/30/2019 CLINICAL DATA:  Heart failure short of breath abdominal fullness EXAM: CT CHEST, ABDOMEN AND PELVIS WITHOUT CONTRAST TECHNIQUE: Multidetector CT imaging of the chest, abdomen and pelvis was performed following the standard protocol without IV contrast. COMPARISON:  Ultrasound 04/30/2019, chest x-ray 04/29/2019 FINDINGS: CT CHEST FINDINGS Cardiovascular: Moderate aortic atherosclerosis without aneurysm. Extensive coronary vascular calcification. Cardiomegaly. No significant pericardial effusion. Post CABG changes. Left-sided cardiac pacing device with single lead tip at the right ventricle. Mediastinum/Nodes: Midline trachea. No thyroid mass. No significant adenopathy. Esophagus shows mild air distention but is otherwise unremarkable Lungs/Pleura: Moderate emphysema. Trace left pleural effusion. Small moderate right pleural effusion. No focal consolidation. 3 mm subpleural left upper lobe lung nodule, series 4, image number 74. Musculoskeletal: Post sternotomy changes. No acute or suspicious osseous lesion CT ABDOMEN PELVIS FINDINGS Hepatobiliary: No focal hepatic abnormality.  Hyperdense sludge or small stones in the gallbladder. No biliary dilatation Pancreas: Unremarkable. No pancreatic ductal dilatation or surrounding inflammatory changes. Spleen: Normal in size without focal abnormality. Adrenals/Urinary Tract: Adrenal glands are normal. No hydronephrosis. Scarring and mild atrophy of the right kidney, most prominent involving the  lower pole. The bladder is unremarkable. Stomach/Bowel: Stomach is within normal limits. Appendix appears normal. No evidence of bowel wall thickening, distention, or inflammatory changes. Vascular/Lymphatic: Moderate aortic atherosclerosis. Focal ectasia of the distal infrarenal abdominal aorta measuring up to 2.8 cm. No significantly enlarged lymph nodes Reproductive: Slightly enlarged prostate with scattered calcification Other: No free air. Small amount of free fluid in the pelvis. Trace perihepatic free fluid. Musculoskeletal: Degenerative changes of the spine with retrolisthesis of L4 on L5. No acute or suspicious osseous abnormality. IMPRESSION: 1. Cardiomegaly with trace left pleural effusion and small moderate right pleural effusion. No focal airspace consolidation. 2. Moderate emphysema. 3 mm left upper lobe lung nodule. No follow-up needed if patient is low-risk. Non-contrast chest CT can be considered in 12 months if patient is high-risk. This recommendation follows the consensus statement: Guidelines for Management of Incidental Pulmonary Nodules Detected on CT Images: From the Fleischner Society 2017; Radiology 2017; 284:228-243. 3. Small amount of free fluid in the pelvis and adjacent to the liver. 4. Gallbladder sludge versus small stones without acute inflammatory change 5. Scarring and atrophy of right kidney 6. Distal infrarenal abdominal aorta measures up to 2.8 cm. Ectatic abdominal aorta at risk for aneurysm development. Recommend followup by ultrasound in 5 years. This recommendation follows ACR consensus guidelines: White Paper of the  ACR Incidental Findings Committee II on Vascular Findings. J Am Coll Radiol 2013; 10:789-794. Electronically Signed   By: Donavan Foil M.D.   On: 04/30/2019 19:23   US Renal  Result Date: 04/30/2019 CLINICAL DATA:  Acute kidney injury EXAM: RENAL / URINARY TRACT ULTRASOUND COMPLETE COMPARISON:  01/27/2018 FINDINGS: Right Kidney: Renal measurements: 8.1 x 4.3 x 4.4 cm = volume: 80 mL. Increased echogenicity. No hydronephrosis. No focal lesion. Left Kidney: Renal measurements: 9.2 x 5.5 x 5.3 cm = volume: 141 mL. Echogenicity within normal limits. No mass or hydronephrosis visualized. Bladder: Empty.  Patient recently voided. Right pleural effusion is noted. IMPRESSION: Since the prior study, the right kidney has gotten smaller. This suggests interval vascular insult. There is no evidence hydronephrosis. Left kidney is within normal limits. Electronically Signed   By: Nelson Chimes M.D.   On: 04/30/2019 10:21   Portable Chest X-ray  Result Date: 06-11-19 CLINICAL DATA:  Endotracheal tube placement. EXAM: PORTABLE CHEST 1 VIEW COMPARISON:  One-view chest x-ray 06-11-2019 FINDINGS: The heart is enlarged. The patient has been intubated. Endotracheal tube terminates 3 cm above the carina. Progressive bilateral lower lobe airspace disease is present. Increased edema and bilateral effusions are present. Atherosclerotic changes are again noted at the aortic arch. IMPRESSION: 1. Interval intubation. The endotracheal tube terminates 3 cm above the carina, in satisfactory position. 2. Cardiomegaly with increasing interstitial edema and bilateral effusions consistent with congestive heart failure. 3. Increasing bibasilar airspace disease. While this likely reflects atelectasis, infection is not excluded. Electronically Signed   By: San Morelle M.D.   On: 06/11/19 05:23   Dg Chest Portable 1 View  Result Date: 2019-06-11 CLINICAL DATA:  Dyspnea EXAM: PORTABLE CHEST 1 VIEW COMPARISON:  Chest x-ray dated  04/29/2019 FINDINGS: The heart size is enlarged. There are small bilateral pleural effusions, right greater than left. There is generalized volume overload with early developing pulmonary edema. A left-sided ICD noted. The patient is status post prior median sternotomy. There is no pneumothorax. IMPRESSION: 1. Cardiomegaly with developing pulmonary edema. 2. Small right-sided pleural effusion, increased from prior study. Electronically Signed   By: Constance Holster M.D.   On:  05-30-2019 00:31    Microbiology Recent Results (from the past 240 hour(s))  SARS Coronavirus 2 [facility] order, Performed in [place] hospital lab)     Status: None   Collection Time: 05-30-19  2:01 AM  Result Value Ref Range Status   SARS Coronavirus 2 NEGATIVE NEGATIVE Final    Comment: (NOTE) If result is NEGATIVE SARS-CoV-2 target nucleic acids are NOT DETECTED. The SARS-CoV-2 RNA is generally detectable in upper and lower  respiratory specimens during the acute phase of infection. The lowest  concentration of SARS-CoV-2 viral copies this assay can detect is 250  copies / mL. A negative result does not preclude SARS-CoV-2 infection  and should not be used as the sole basis for treatment or other  patient management decisions.  A negative result may occur with  improper specimen collection / handling, submission of specimen other  than nasopharyngeal swab, presence of viral mutation(s) within the  areas targeted by this assay, and inadequate number of viral copies  (<250 copies / mL). A negative result must be combined with clinical  observations, patient history, and epidemiological information. If result is POSITIVE SARS-CoV-2 target nucleic acids are DETECTED. The SARS-CoV-2 RNA is generally detectable in upper and lower  respiratory specimens dur ing the acute phase of infection.  Positive  results are indicative of active infection with SARS-CoV-2.  Clinical  correlation with patient history and other  diagnostic information is  necessary to determine patient infection status.  Positive results do  not rule out bacterial infection or co-infection with other viruses. If result is PRESUMPTIVE POSTIVE SARS-CoV-2 nucleic acids MAY BE PRESENT.   A presumptive positive result was obtained on the submitted specimen  and confirmed on repeat testing.  While 2019 novel coronavirus  (SARS-CoV-2) nucleic acids may be present in the submitted sample  additional confirmatory testing may be necessary for epidemiological  and / or clinical management purposes  to differentiate between  SARS-CoV-2 and other Sarbecovirus currently known to infect humans.  If clinically indicated additional testing with an alternate test  methodology [phone]) is advised. The SARS-CoV-2 RNA is generally  detectable in upper and lower respiratory sp ecimens during the acute  phase of infection. The expected result is Negative. Fact Sheet for Patients:  StrictlyIdeas.no Fact Sheet for Healthcare Providers: BankingDealers.co.za This test is not yet approved or cleared by the [place] FDA and has been authorized for detection and/or diagnosis of SARS-CoV-2 by FDA under an Emergency Use Authorization (EUA).  This EUA will remain in effect (meaning this test can be used) for the duration of the COVID-19 declaration under Section 564(b)(1) of the Act, 21 U.S.C. section 360bbb-3(b)(1), unless the authorization is terminated or revoked sooner. Performed at [facility], [address] [place], [place] [address]     Lab Basic Metabolic Panel: Recent Labs  Lab 05/07/2019 2342 05/30/2019 0820 05/30/19 1134  NA 140 139 137  K 3.5 5.2* 4.9  CL 96* 100 98  CO2 31 23 21*  GLUCOSE 164* 122* 230*  BUN 29* 29* 29*  CREATININE 2.48* 2.97* 3.22*  CALCIUM 8.8* 7.4* 6.6*  MG  --  2.1  --    Liver Function Tests: Recent Labs  Lab 05/24/2019 2342 May 30, 2019 0820  05/30/2019 1134  AST 92* 220* 1,034*  ALT 82* 136* 865*  ALKPHOS 335* 311* 248*  BILITOT 1.7* 4.5* 4.3*  PROT 7.2 6.1* 5.0*  ALBUMIN 3.6 2.9* 2.4*   No results for input(s): LIPASE, AMYLASE in the last  168 hours. No results for input(s): AMMONIA in the last 168 hours. CBC: Recent Labs  Lab 05/25/2019 2342 05-21-2019 0600 May 21, 2019 0820 05/21/19 1134  WBC 11.5* 20.5* 23.9* 26.7*  NEUTROABS 9.1*  --   --   --   HGB 17.8* 19.0* 20.1* 18.1*  HCT 54.5* 58.9* 61.6* 56.8*  MCV 96.0 93.8 93.8 96.8  PLT 159 70* 39* 22*   Cardiac Enzymes: Recent Labs  Lab 2019/05/21 1134  TROPONINI 6.44*   Sepsis Labs: Recent Labs  Lab 05/11/2019 2342 21-May-2019 0600 May 21, 2019 0820 May 21, 2019 1134 05-21-19 1145  PROCALCITON  --   --  1.54  --   --   WBC 11.5* 20.5* 23.9* 26.7*  --   LATICACIDVEN  --   --   --   --  7.6*    Procedures/Operations  Upper Endoscopy Mechanical intubation  Left femoral central line placement Right femoral arterial line placement  Marda Stalker, [place] Pager [phone] (please enter 7 digits) PCCM Consult Pager [phone] (please enter 7 digits)

## 2019-06-01 NOTE — Progress Notes (Signed)
Hinton Dyer, NP called to bedside to evaluate patient.  Patient becoming hypotensive with heart rate decreasing from 115 to 92. Pacer pads applied.

## 2019-06-01 NOTE — H&P (Signed)
Chris Nguyen NAME: Chris Nguyen    MR#:  [id number]  DATE OF BIRTH:  [birth year]  DATE OF ADMISSION:  05/20/2019  PRIMARY CARE PHYSICIAN: Birdie Sons, MD   REQUESTING/REFERRING PHYSICIAN: Owens Shark, MD  CHIEF COMPLAINT:   Chief Complaint  Patient presents with  . Shortness of Breath  . Weakness  . Hematemesis    HISTORY OF PRESENT ILLNESS:  Chris Nguyen  is a 68 y.o. male who presents with chief complaint as above.  Patient presents the ED with a complaint of hematemesis.  He states that he has significant history of alcohol use, but no prior documentation of varices or cirrhosis.  He was profoundly hypertensive on arrival here.  Transfusion started and hospitalist called for admission  PAST MEDICAL HISTORY:   Past Medical History:  Diagnosis Date  . AICD (automatic cardioverter/defibrillator) present   . BPH (benign prostatic hyperplasia)   . CAD (coronary artery disease) 05/14/2006  . Cardiac arrest ([place]) 11/12/2013   Overview:  December 2014: ICD implanted  Last Assessment & Plan:  Relevant Hx: Course: Daily Update: Today's Plan:   . Chronic systolic CHF (congestive heart failure) ([place])   . Erectile dysfunction   . H/O coronary artery bypass surgery 05/17/2015   Overview:  LIMA to LAD   . HLD (hyperlipidemia)   . Osteoarthritis of left wrist   . Paroxysmal atrial fibrillation ([place])      PAST SURGICAL HISTORY:   Past Surgical History:  Procedure Laterality Date  . CARDIAC CATHETERIZATION  06/27/2008  . CARPAL TUNNEL RELEASE    . CORONARY ARTERY BYPASS GRAFT  2006 and 2014   LIMA to LAD at [facility] 2014  . MITRAL VALVE REPAIR       SOCIAL HISTORY:   Social History   Tobacco Use  . Smoking status: Former Smoker    Last attempt to quit: 12/01/2004    Years since quitting: 14.4  . Smokeless tobacco: Never Used  Substance Use Topics  . Alcohol use: Yes    Alcohol/week: 7.0 - 9.0 standard drinks    Types: 1  Glasses of wine, 6 - 8 Cans of beer per week     FAMILY HISTORY:   Family History  Problem Relation Age of Onset  . CAD Mother 47  . Diabetes Mother        Diabetes Mellitus  . Dementia Father   . Glaucoma Father   . Heart disease Other      DRUG ALLERGIES:   Allergies  Allergen Reactions  . Spironolactone Other (See Comments)    Hyperkalemia  . Entresto [Sacubitril-Valsartan] Diarrhea  . Ziac [Bisoprolol-Hydrochlorothiazide]     MEDICATIONS AT HOME:   Prior to Admission medications   Medication Sig Start Date End Date Taking? Authorizing Provider  amiodarone (PACERONE) 200 MG tablet Take 200 mg by mouth See admin instructions. Take 1 tablet (200mg ) by mouth twice daily for 2 weeks then take 1 tablet (200mg ) by mouth daily 04/26/19   [provider]  aspirin 81 MG EC tablet Take 81 mg by mouth daily.     [provider]  atorvastatin (LIPITOR) 80 MG tablet TAKE 1 TABLET EVERY DAY Patient not taking: Reported on 04/29/2019 02/05/16   Birdie Sons, MD  budesonide (PULMICORT) 0.25 MG/2ML nebulizer solution Take 2 mLs (0.25 mg total) by nebulization 2 (two) times daily. 05/02/19   Dustin Flock, MD  diltiazem (CARDIZEM CD) 120 MG 24 hr capsule Take  1 capsule (120 mg total) by mouth daily. 05/02/19   Dustin Flock, MD  levothyroxine (SYNTHROID) 25 MCG tablet Take 1 tablet by month in the mornings and 1 tablet by mouth in the evenings. 05/03/19   Birdie Sons, MD  lisinopril (PRINIVIL,ZESTRIL) 20 MG tablet TAKE 1 TABLET EVERY DAY Patient taking differently: Take 20 mg by mouth daily.  05/13/18   Birdie Sons, MD  predniSONE (STERAPRED UNI-PAK 21 TAB) 10 MG (21) TBPK tablet Start at 60mg  taper by 10mg  until complete 05/02/19   Dustin Flock, MD  torsemide (DEMADEX) 20 MG tablet Take 2 tablets (40 mg total) by mouth 2 (two) times daily. 05/02/19   Dustin Flock, MD  traMADol (ULTRAM) 50 MG tablet Take 100 mg by mouth every 12 (twelve) hours as needed for  moderate pain.    [provider]    REVIEW OF SYSTEMS:  Review of Systems  Constitutional: Negative for chills, fever, malaise/fatigue and weight loss.  HENT: Negative for ear pain, hearing loss and tinnitus.   Eyes: Negative for blurred vision, double vision, pain and redness.  Respiratory: Negative for cough, hemoptysis and shortness of breath.   Cardiovascular: Negative for chest pain, palpitations, orthopnea and leg swelling.  Gastrointestinal: Negative for abdominal pain, constipation, diarrhea, nausea and vomiting.       Hematemesis  Genitourinary: Negative for dysuria, frequency and hematuria.  Musculoskeletal: Negative for back pain, joint pain and neck pain.  Skin:       No acne, rash, or lesions  Neurological: Negative for dizziness, tremors, focal weakness and weakness.  Endo/Heme/Allergies: Negative for polydipsia. Does not bruise/bleed easily.  Psychiatric/Behavioral: Negative for depression. The patient is not nervous/anxious and does not have insomnia.      VITAL SIGNS:   Vitals:   05-17-2019 0011 05/17/2019 0015 May 17, 2019 0016 05/17/2019 0017  BP:   (!) 68/57   Pulse:   (!) 143   Resp: 15 (!) 27 19   Temp:      TempSrc:      SpO2:    95%  Weight:      Height:       Wt Readings from Last 3 Encounters:  05/18/2019 83 kg  05/02/19 82.5 kg  01/31/18 81.9 kg    PHYSICAL EXAMINATION:  Physical Exam  Vitals reviewed. Constitutional: He is oriented to person, place, and time. He appears well-developed and well-nourished. No distress.  HENT:  Head: Normocephalic and atraumatic.  Mouth/Throat: Oropharynx is clear and moist.  Eyes: Pupils are equal, round, and reactive to light. EOM are normal. No scleral icterus.  Pale conjunctiva  Neck: Normal range of motion. Neck supple. No JVD present. No thyromegaly present.  Cardiovascular: Normal rate, regular rhythm and intact distal pulses. Exam reveals no gallop and no friction rub.  No murmur heard. Respiratory:  Effort normal and breath sounds normal. No respiratory distress. He has no wheezes. He has no rales.  GI: Soft. Bowel sounds are normal. He exhibits no distension. There is no abdominal tenderness.  Musculoskeletal: Normal range of motion.        General: No edema.     Comments: No arthritis, no gout  Lymphadenopathy:    He has no cervical adenopathy.  Neurological: He is alert and oriented to person, place, and time. No cranial nerve deficit.  No dysarthria, no aphasia  Skin: Skin is warm and dry. No rash noted. No erythema.  Psychiatric: He has a normal mood and affect. His behavior is normal. Judgment and  thought content normal.    LABORATORY PANEL:   CBC Recent Labs  Lab 05/08/2019 2342  WBC 11.5*  HGB 17.8*  HCT 54.5*  PLT 159   ------------------------------------------------------------------------------------------------------------------  Chemistries  Recent Labs  Lab 05/19/2019 2342  NA 140  K 3.5  CL 96*  CO2 31  GLUCOSE 164*  BUN 29*  CREATININE 2.48*  CALCIUM 8.8*  AST 92*  ALT 82*  ALKPHOS 335*  BILITOT 1.7*   ------------------------------------------------------------------------------------------------------------------  Cardiac Enzymes No results for input(s): TROPONINI in the last 168 hours. ------------------------------------------------------------------------------------------------------------------  RADIOLOGY:  No results found.  EKG:   Orders placed or performed during the hospital encounter of 05/31/2019  . EKG 12-Lead  . EKG 12-Lead    IMPRESSION AND PLAN:  Principal Problem:   Hematemesis -GI contacted by ED physician and recommends blood transfusion and admit to stepdown with close monitoring.  GI consult. Active Problems:   Acute on chronic renal failure (HCC) -likely related to bleeding.  Patient is on fluids for his hypotension as well.  Avoid nephrotoxins and monitor   Hypotension -likely due to his bleeding event.  Patient  is currently getting blood and fluids.  Will admit to stepdown and monitor closely with intensivist consult   CAD (coronary artery disease) -continue home meds   Paroxysmal a-fib ([place]) -continue home rate controlling medications once med rec is complete   Chronic combined CHF (congestive heart failure) ([place]) -patient has chronic combined heart failure with last known EF of 20 to 25% as well as impaired relaxation.  We will have to monitor closely with above fluid administration   Acid reflux -patient is on octreotide and PPI drip   HLD (hyperlipidemia) -continue home meds once med rec is complete and the patient is able to take p.o.  Chart review performed and case discussed with ED provider. Labs, imaging and/or ECG reviewed by provider and discussed with patient/family. Management plans discussed with the patient and/or family.  COVID-19 status: Test pending   DVT PROPHYLAXIS: Mechanical only  GI PROPHYLAXIS:  PPI   ADMISSION STATUS: Inpatient     CODE STATUS: Full Code Status History    Date Active Date Inactive Code Status Order ID Comments User Context   04/29/2019 2200 05/02/2019 1657 Full Code [id number]  Sela Hua, MD Inpatient   01/23/2018 1259 01/31/2018 1424 Full Code [id number]  Gorden Harms, MD Inpatient   01/16/2017 1521 01/19/2017 1503 Full Code [id number]  Vaughan Basta, MD Inpatient   02/25/2016 0058 02/25/2016 2016 Full Code [id number]  Lance Coon, MD ED      TOTAL CRITICAL CARE TIME TAKING CARE OF THIS PATIENT: 50 minutes.   This patient was evaluated in the context of the global COVID-19 pandemic, which necessitated consideration that the patient might be at risk for infection with the SARS-CoV-2 virus that causes COVID-19. Institutional protocols and algorithms that pertain to the evaluation of patients at risk for COVID-19 are in a state of rapid change based on information released by regulatory bodies including the CDC and federal and state  organizations. These policies and algorithms were followed to the best of this provider's knowledge to date during the patient's care at this facility.  Ethlyn Daniels June 10, 2019, 12:26 AM  [facility]  Office  [phone]  CC: Primary care physician; Birdie Sons, MD  Note:  This document was prepared using Dragon voice recognition software and may include unintentional dictation errors.

## 2019-06-01 NOTE — Progress Notes (Addendum)
11:20 prece dex and fentanyl stopped.  11:24 Neo synephrine titrated per NP verbal request.

## 2019-06-01 NOTE — Progress Notes (Addendum)
Patient expired.  No heart tones, blood pressure, or respirations.  Patient pronounced at 13:39 with Charlie,RN  as second witness.  Hinton Dyer, NP notified.

## 2019-06-01 DEATH — deceased

## 2019-06-02 ENCOUNTER — Ambulatory Visit: Payer: Medicare HMO | Admitting: Family

## 2019-09-04 IMAGING — CR DG CHEST 2V
2 series · 2 of 2 positions shown · non-contrast
Comparison: November 07, 2017

CLINICAL DATA: Cough and shortness of breath

EXAM:
CHEST  2 VIEW

[chest pa]
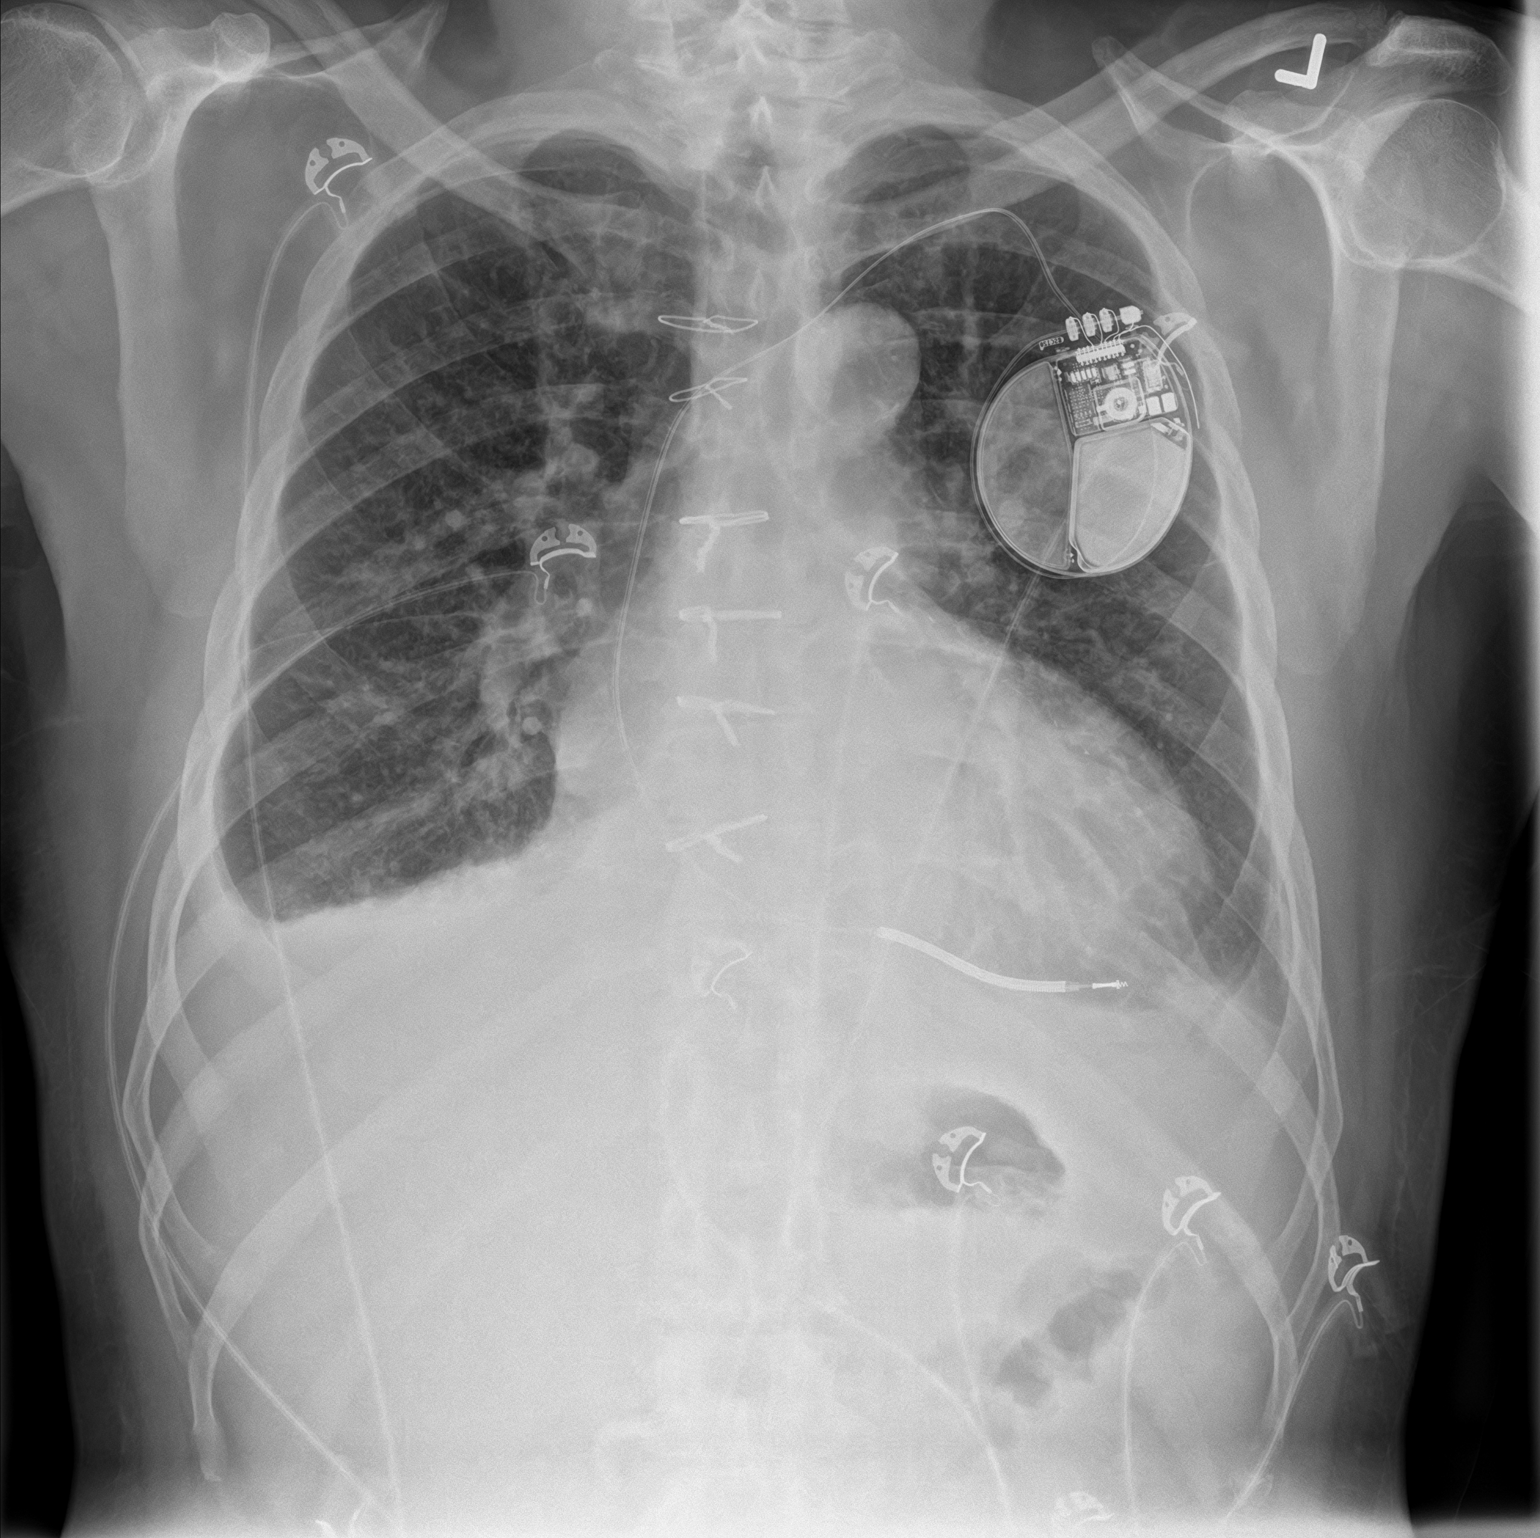

[chest lat]
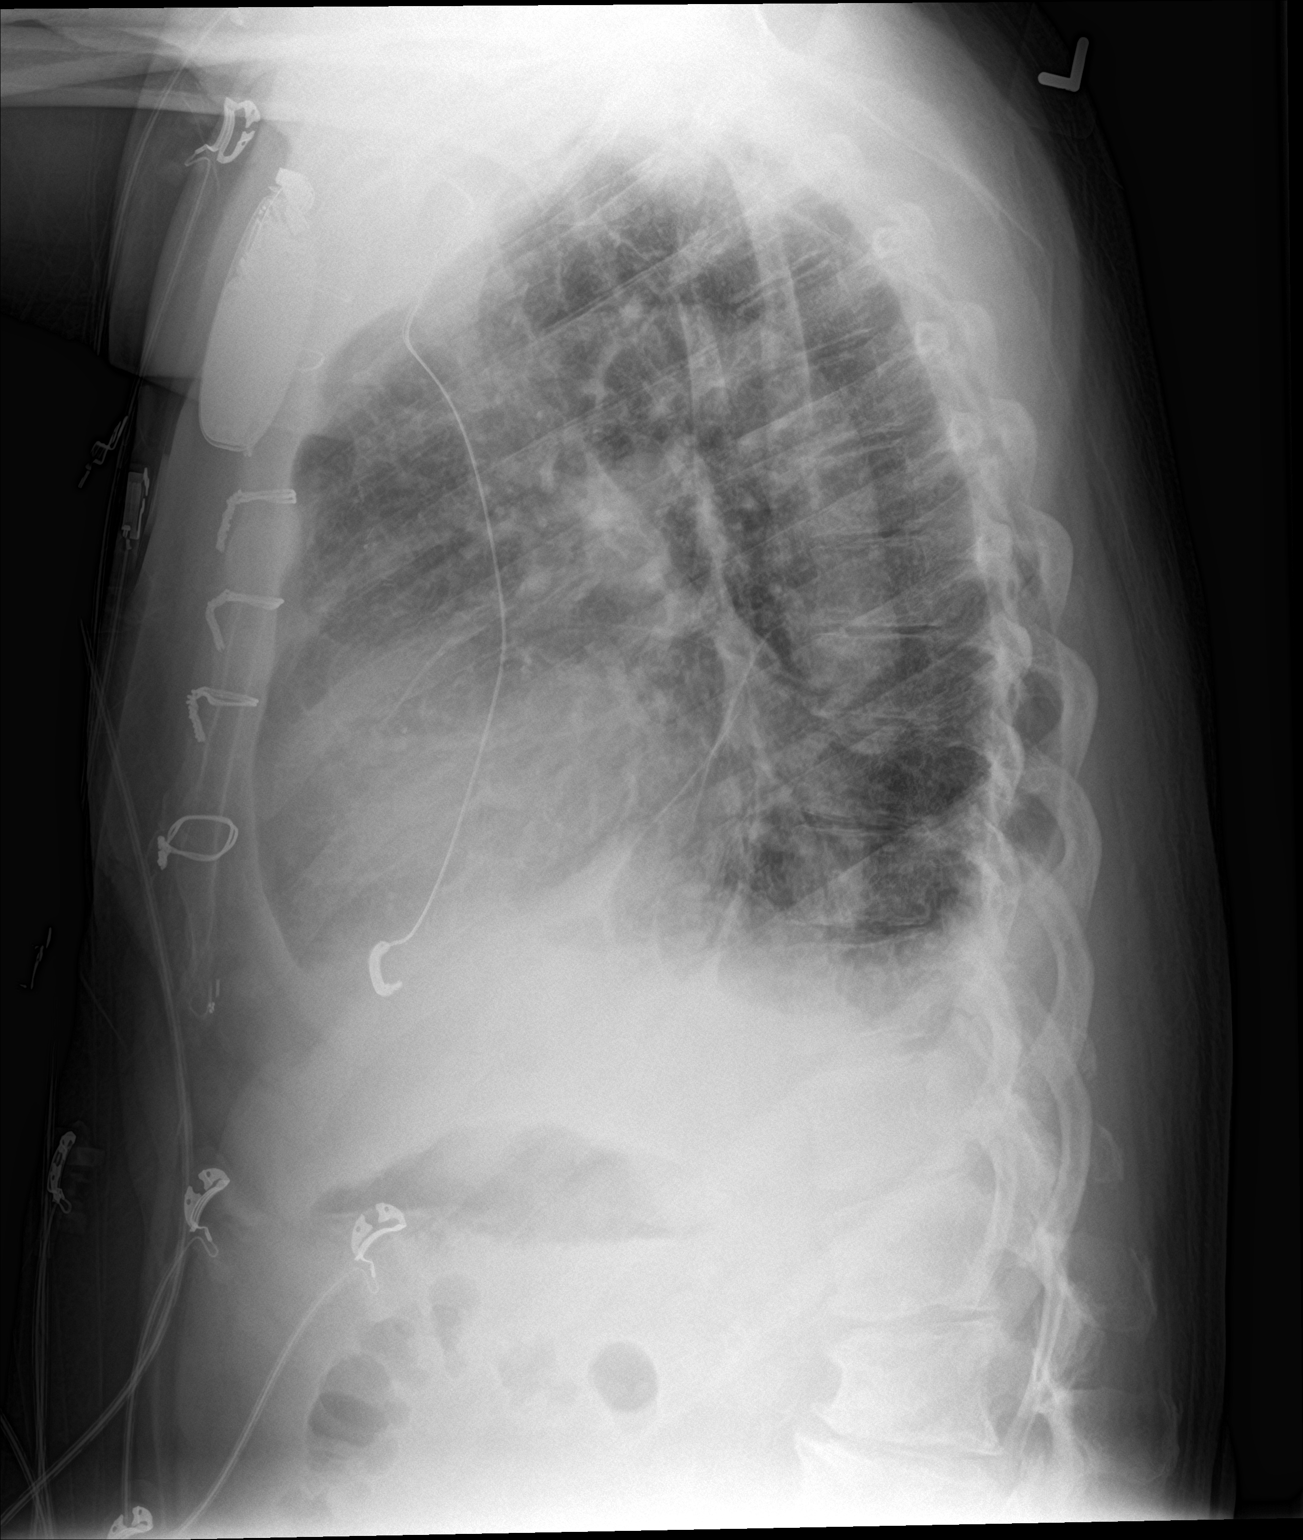

[2 of 2 positions shown; findings below may reference images not displayed]

FINDINGS: Small bilateral pleural effusions with underlying atelectasis. Mild
pulmonary venous congestion without overt edema. Stable AICD device.
Cardiomegaly. No other changes.
IMPRESSION: Cardiomegaly, pulmonary venous congestion, and small bilateral
pleural effusions.

## 2019-09-09 IMAGING — DX DG CHEST 1V PORT
1 series · 1 of 1 positions shown · non-contrast
Comparison: 01/23/2018 and prior radiographs

CLINICAL DATA: Shortness of breath

EXAM:
PORTABLE CHEST 1 VIEW

[chest ap]
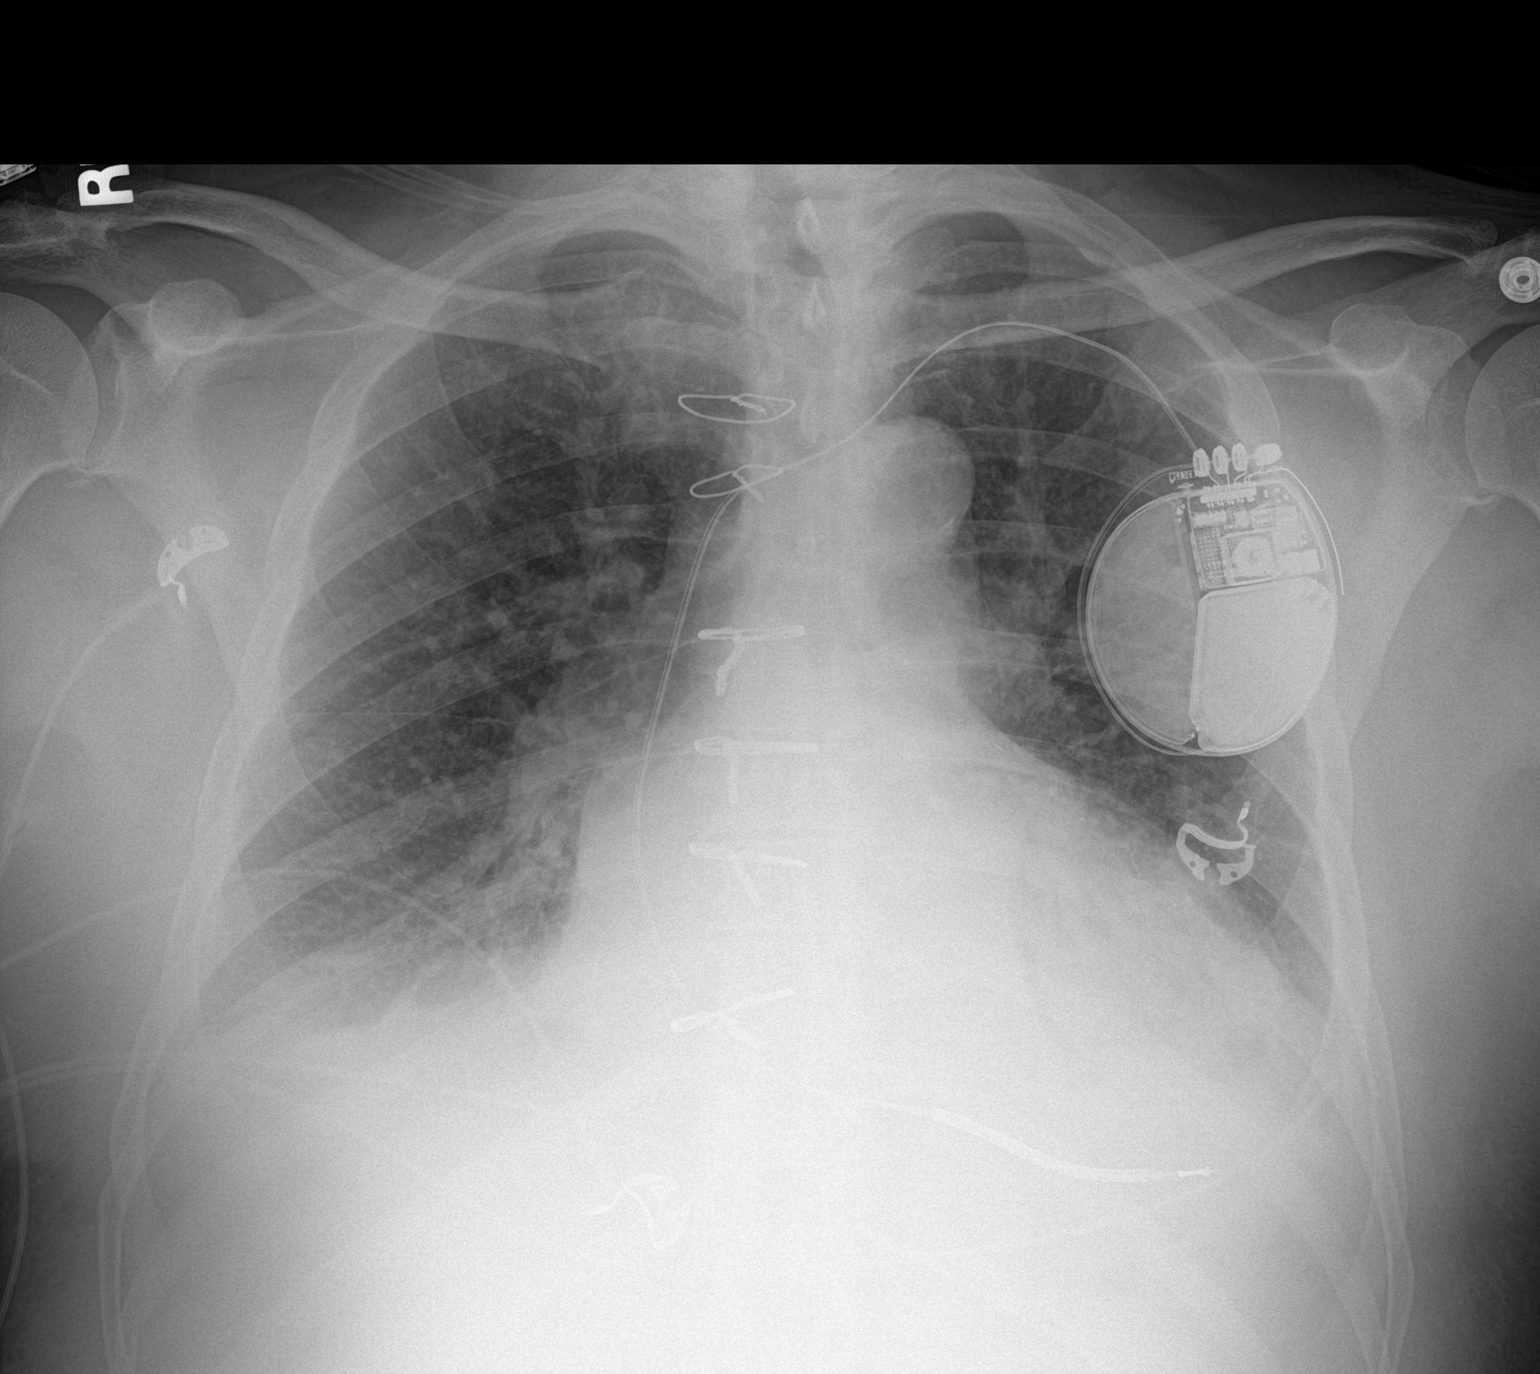

[1 of 1 positions shown; findings below may reference images not displayed]

FINDINGS: Cardiomegaly, median sternotomy and AICD again noted.

Slightly increased pulmonary vascular congestion noted.

Small bilateral pleural effusions and bibasilar atelectasis versus
airspace disease noted in slightly increased.

There is no evidence of pneumothorax.
IMPRESSION: Cardiomegaly with slightly increasing pulmonary vascular congestion,
small bilateral pleural effusions and bibasilar atelectasis/airspace
disease..
